# Patient Record
Sex: Female | Born: 1990
Health system: Southern US, Community
[De-identification: ages and names within clinical notes are randomized; demographics above are authoritative.]

## PROBLEM LIST (undated history)

## (undated) ENCOUNTER — Inpatient Hospital Stay (HOSPITAL_COMMUNITY): Payer: Self-pay

## (undated) DIAGNOSIS — Z8619 Personal history of other infectious and parasitic diseases: Secondary | ICD-10-CM

## (undated) DIAGNOSIS — A549 Gonococcal infection, unspecified: Secondary | ICD-10-CM

## (undated) DIAGNOSIS — T7840XA Allergy, unspecified, initial encounter: Secondary | ICD-10-CM

## (undated) DIAGNOSIS — J45909 Unspecified asthma, uncomplicated: Secondary | ICD-10-CM

## (undated) DIAGNOSIS — F32A Depression, unspecified: Secondary | ICD-10-CM

## (undated) DIAGNOSIS — D649 Anemia, unspecified: Secondary | ICD-10-CM

## (undated) HISTORY — DX: Allergy, unspecified, initial encounter: T78.40XA

## (undated) HISTORY — PX: NO PAST SURGERIES: SHX2092

---
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
## 2015-11-23 ENCOUNTER — Emergency Department (HOSPITAL_COMMUNITY): Payer: Federal, State, Local not specified - PPO

## 2015-11-23 ENCOUNTER — Emergency Department (HOSPITAL_COMMUNITY)
Admission: EM | Admit: 2015-11-23 | Discharge: 2015-11-23 | Disposition: A | Discharge status: Home / Self Care | Payer: Federal, State, Local not specified - PPO | Attending: Emergency Medicine | Admitting: Emergency Medicine

## 2015-11-23 ENCOUNTER — Encounter (HOSPITAL_COMMUNITY): Payer: Self-pay | Admitting: *Deleted

## 2015-11-23 DIAGNOSIS — Y929 Unspecified place or not applicable: Secondary | ICD-10-CM | POA: Insufficient documentation

## 2015-11-23 DIAGNOSIS — Y990 Civilian activity done for income or pay: Secondary | ICD-10-CM | POA: Diagnosis not present

## 2015-11-23 DIAGNOSIS — Y9389 Activity, other specified: Secondary | ICD-10-CM | POA: Insufficient documentation

## 2015-11-23 DIAGNOSIS — M79672 Pain in left foot: Secondary | ICD-10-CM | POA: Diagnosis present

## 2015-11-23 DIAGNOSIS — S92355A Nondisplaced fracture of fifth metatarsal bone, left foot, initial encounter for closed fracture: Secondary | ICD-10-CM

## 2015-11-23 DIAGNOSIS — W2201XA Walked into wall, initial encounter: Secondary | ICD-10-CM | POA: Insufficient documentation

## 2015-11-23 MED ORDER — HYDROCODONE-ACETAMINOPHEN 5-325 MG PO TABS: 2.0000 | ORAL_TABLET | ORAL | 0 refills | Status: DC | PRN

## 2015-11-23 MED ORDER — IBUPROFEN 800 MG PO TABS: 800.0000 mg | ORAL_TABLET | Freq: Three times a day (TID) | ORAL | 0 refills | Status: DC

## 2015-11-23 NOTE — ED Provider Notes (Signed)
[facility]-EMERGENCY DEPT Provider Note   CSN: [id number] Arrival date & time: 11/23/15  8828  First Provider Contact:  None    By signing my name below, I, Tanda Rockers, attest that this documentation has been prepared under the direction and in the presence of Langston Masker, [place].  Electronically Signed: Tanda Rockers, ED Scribe. 11/23/15. 8:06 PM.   History   Chief Complaint No chief complaint on file.   HPI Andrea Foster is a 25 y.o. female who presents to the Emergency Department complaining of sudden onset, constant, left foot pain x 4 days. Pt reports that she ran down 4 flights of stairs when she couldn't stop and inverted her left ankle. Pt did have mild pain to the left foot and ankle then but reports that 2 days ago while at work she hit her left foot on a wall, causing worsening pain to the area. She was able to ambulate after the incident but mentioned having pain with ambulation and bearing weight. She also notes bruising to her toes. Denies weakness, numbness, tingling, or any other associated symptoms.    The history is provided by the patient. No language interpreter was used.    No past medical history on file.  There are no active problems to display for this patient.   No past surgical history on file.  OB History    No data available       Home Medications    Prior to Admission medications   Not on File    Family History No family history on file.  Social History Social History  Substance Use Topics  . Smoking status: Not on file  . Smokeless tobacco: Not on file  . Alcohol use Not on file     Allergies   Review of patient's allergies indicates not on file.   Review of Systems Review of Systems  Musculoskeletal: Positive for arthralgias.  Skin: Positive for color change.  Neurological: Negative for weakness and numbness.  All other systems reviewed and are negative.    Physical Exam Updated Vital Signs There were no vitals taken  for this visit.  Physical Exam  Constitutional: She is oriented to person, place, and time. She appears well-developed and well-nourished. No distress.  HENT:  Head: Normocephalic and atraumatic.  Eyes: Conjunctivae and EOM are normal.  Neck: Neck supple. No tracheal deviation present.  Cardiovascular: Normal rate.   Pulmonary/Chest: Effort normal. No respiratory distress.  Abdominal: Soft.  Musculoskeletal: Normal range of motion. She exhibits tenderness.  Bruised left toes, tender left midfoot to toes. NVI and neurosensory intact.   Neurological: She is alert and oriented to person, place, and time.  Skin: Skin is warm and dry.  Psychiatric: She has a normal mood and affect. Her behavior is normal.  Nursing note and vitals reviewed.    ED Treatments / Results   DIAGNOSTIC STUDIES: Oxygen Saturation is 100% on RA, normal by my interpretation.    COORDINATION OF CARE: 7:54 PM-Discussed treatment plan which includes DG L Foot and DG L Ankle with pt at bedside and pt agreed to plan.    Labs (all labs ordered are listed, but only abnormal results are displayed) Labs Reviewed - No data to display  EKG  EKG Interpretation None       Radiology No results found.  Procedures Procedures (including critical care time)  Medications Ordered in ED Medications - No data to display   Initial Impression / Assessment and Plan / ED Course  I  have reviewed the triage vital signs and the nursing notes.  Pertinent labs & imaging results that were available during my care of the patient were reviewed by me and considered in my medical decision making (see chart for details).  Clinical Course    Pt counseled on fracture.  Pt advised to follow up with Dr. Despina Hick.    Final Clinical Impressions(s) / ED Diagnoses   Final diagnoses:  Closed nondisplaced fracture of fifth left metatarsal bone, initial encounter    New Prescriptions New Prescriptions   HYDROCODONE-ACETAMINOPHEN  (NORCO/VICODIN) 5-325 MG TABLET    Take 2 tablets by mouth every 4 (four) hours as needed.   IBUPROFEN (ADVIL,MOTRIN) 800 MG TABLET    Take 1 tablet (800 mg total) by mouth 3 (three) times daily.     Lonia Skinner Needham, PA-C 11/23/15 2106    Mancel Bale, MD 11/26/15 1239

## 2015-11-23 NOTE — ED Notes (Addendum)
Pt has echymosis to second ,thrid and fourth toes on her left foot. Per PA make sure x-ray shields the pt prior to x-ray of her foot. (7:50pm)pt tolerated x-ray-Phoned ortho tech. (9pm)pt placed in a cam boot and given crutch walking instructions. Pt returned demonstration on using crutches.

## 2016-01-28 ENCOUNTER — Emergency Department (HOSPITAL_COMMUNITY)
Admission: EM | Admit: 2016-01-28 | Discharge: 2016-01-28 | Disposition: A | Discharge status: Home / Self Care | Payer: Federal, State, Local not specified - PPO | Attending: Physician Assistant | Admitting: Physician Assistant

## 2016-01-28 ENCOUNTER — Emergency Department (HOSPITAL_COMMUNITY): Payer: Federal, State, Local not specified - PPO

## 2016-01-28 ENCOUNTER — Encounter (HOSPITAL_COMMUNITY): Payer: Self-pay

## 2016-01-28 DIAGNOSIS — Y939 Activity, unspecified: Secondary | ICD-10-CM | POA: Insufficient documentation

## 2016-01-28 DIAGNOSIS — Z79899 Other long term (current) drug therapy: Secondary | ICD-10-CM | POA: Insufficient documentation

## 2016-01-28 DIAGNOSIS — W19XXXA Unspecified fall, initial encounter: Secondary | ICD-10-CM | POA: Diagnosis not present

## 2016-01-28 DIAGNOSIS — Y929 Unspecified place or not applicable: Secondary | ICD-10-CM | POA: Insufficient documentation

## 2016-01-28 DIAGNOSIS — Y999 Unspecified external cause status: Secondary | ICD-10-CM | POA: Insufficient documentation

## 2016-01-28 DIAGNOSIS — S4992XA Unspecified injury of left shoulder and upper arm, initial encounter: Secondary | ICD-10-CM | POA: Diagnosis present

## 2016-01-28 DIAGNOSIS — S46912A Strain of unspecified muscle, fascia and tendon at shoulder and upper arm level, left arm, initial encounter: Secondary | ICD-10-CM | POA: Diagnosis not present

## 2016-01-28 MED ORDER — METHOCARBAMOL 500 MG PO TABS: 500.0000 mg | ORAL_TABLET | Freq: Two times a day (BID) | ORAL | 0 refills | Status: DC | PRN

## 2016-01-28 MED ORDER — IBUPROFEN 800 MG PO TABS: 800.0000 mg | ORAL_TABLET | Freq: Three times a day (TID) | ORAL | 0 refills | Status: DC | PRN

## 2016-01-28 NOTE — ED Triage Notes (Signed)
Pt c/o L shoulder pain radiating into base of neck x 6 days.  Pain score 5/10.  Pt reports taking ibuprofen w/o relief.  Pt sts "I think, I slept on it wrong and then, I fell on it on Saturday(6 days ago)."  Full ROM noted.  Pain increases w/ movement.  Pt sts "it feels better when the hot water is hitting it in the shower."

## 2016-01-28 NOTE — ED Provider Notes (Signed)
[facility]-EMERG[facility]Y DEPT Provider Note   CSN: [id number] Arrival date & time: 01/28/16  1603  By signing my name below, I, Andrea Foster, attest that this documentation has been prepared under the direction and in the presence of [facility] Sch[facility]yler[facility]ime Zyler Hyson, PA-C. Electronically Signed: Angelene GiovanniEmmanuella Foster, ED Scribe. 01/28/16. 5:05 PM.    History   Chief Complaint Chief Complaint  Patient presents with  . Shoulder Pain   HPI Comments: Andrea Foster is a 25 y.o. female who presents to the Emergency Department complaining of gradually worsening moderate left shoulder pain that radiates toward her neck and upper arm onset 10 days ago. She notes that the pain is worse with movement but better with a hot shower. Pt states that she has tried to use ice, OTC icy hot patches, and ibuprofen with no relief. She denies any recent injuries, falls, or trauma but states that she believes she "slept on it wrong". Pt is right hand dominate. 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 No fever, numbness/tingling, or any open wounds.   The history is provided by the patient. No language interpreter was used.    History reviewed. No pertinent past medical history.  There are no active problems to display for this patient.   History reviewed. No pertinent surgical history.  OB History    Gravida Para Term Preterm AB Living   0 0 0 0 0 0   SAB TAB Ectopic Multiple Live Births   0 0 0 0 0       Home Medications    Prior to Admission medications   Medication Sig Start Date End Date Taking? Authorizing Provider  HYDROcodone-acetaminophen (NORCO/VICODIN) 5-325 MG tablet Take 2 tablets by mouth every 4 (four) hours as needed. 11/23/15   Andrea AreasLeslie K Sofia, PA-C  ibuprofen (ADVIL,MOTRIN) 800 MG tablet Take 1 tablet (800 mg total) by mouth every 8 (eight) hours as needed. 01/28/16   Andrea PicketJaime Pilcher Andrea Wiesen, PA-C  methocarbamol (ROBAXIN) 500 MG tablet Take 1 tablet (500 mg total) by mouth 2 (two) times  daily as needed for muscle spasms. 01/28/16   Andrea PicketJaime Pilcher Andrea Tiznado, PA-C    Family History History reviewed. No pertinent family history.  Social History Social History  Substance Use Topics  . Smoking status: Never Smoker  . Smokeless tobacco: Never Used  . Alcohol use 0.6 oz/week    1 Shots of liquor per week     Comment: socially     Allergies   Latex   Review of Systems Review of Systems  Constitutional: Negative for fever.  Musculoskeletal: Positive for arthralgias.  Skin: Negative for wound.  Neurological: Negative for numbness.     Physical Exam Updated Vital Signs BP 103/70 (BP Location: Left Arm)   Pulse 67   Temp 98.9 F (37.2 C) (Oral)   Resp 18   LMP 01/16/2016   SpO2 100%   Physical Exam  Constitutional: She is oriented to person, place, and time. She appears well-developed and well-nourished. No distress.  HENT:  Head: Normocephalic and atraumatic.  Neck: Normal range of motion.  No midline tenderness. Full ROM without pain.   Cardiovascular: Normal rate, regular rhythm and normal heart sounds.   Pulmonary/Chest: Effort normal and breath sounds normal. No respiratory distress.  Musculoskeletal:       Arms: Left shoulder : TTP as depicted in image. Full ROM. Negative Empty can test, Negative Neer's, Negative Lift off. No swelling, erythema, or ecchymosis present. No step-off, crepitus, or  deformity appreciated. 5/5 muscle strength of BUE. 2+ radial pulse, sensation intact, all compartments soft.  No midline C/T/L spine tenderness.   Neurological: She is alert and oriented to person, place, and time.  Skin: Skin is warm and dry.  Nursing note and vitals reviewed.    ED Treatments / Results  DIAGNOSTIC STUDIES: Oxygen Saturation is 94% on RA, adeuqate by my interpretation.    COORDINATION OF CARE: 5:04 PM- Pt advised of plan for treatment and pt agrees. Pt informed of her x-ray results. She will receive Ibuprofen and Robaxin for pain relief. Pt  will receive a work note to return to work in 3 days. Will provide resources for Ortho follow up if pain persists.    Labs (all labs ordered are listed, but only abnormal results are displayed) Labs Reviewed - No data to display  EKG  EKG Interpretation None       Radiology Dg Shoulder Left  Result Date: 01/28/2016 CLINICAL DATA:  Fall 6 days ago.  Left shoulder pain EXAM: LEFT SHOULDER - 2+ VIEW COMPARISON:  None. FINDINGS: There is no evidence of fracture or dislocation. There is no evidence of arthropathy or other focal bone abnormality. Soft tissues are unremarkable. IMPRESSION: Negative. Electronically Signed   By: Marlan Palau M.D.   On: 01/28/2016 16:41    Procedures Procedures (including critical care time)  Medications Ordered in ED Medications - No data to display   Initial Impression / Assessment and Plan / ED Course  Andrea Sauer, PA-C has reviewed the triage vital signs and the nursing notes.  Pertinent labs & imaging results that were available during my care of the patient were reviewed by me and considered in my medical decision making (see chart for details).  Clinical Course   Andrea Foster is a 25 y.o. female who presents to ED with left shoulder and left-sided neck pain after sleeping on it oddly 6 days ago. Likely musk etiology. X-rays obtained and unremarkable. Symptomatic home care instructions discussed. Offered sling but patient does not feel it is necessary. PCP follow up if symptoms persist. Reasons to return to ED discussed and all questions answered.   Final Clinical Impressions(s) / ED Diagnoses   Final diagnoses:  Left shoulder strain, initial encounter    New Prescriptions Discharge Medication List as of 01/28/2016  5:06 PM    START taking these medications   Details  methocarbamol (ROBAXIN) 500 MG tablet Take 1 tablet (500 mg total) by mouth 2 (two) times daily as needed for muscle spasms., Starting Fri 01/28/2016, Print       I  personally performed the services described in this documentation, which was scribed in my presence. The recorded information has been reviewed and is accurate.    Andrea Picket Sheana Bir, PA-C 01/28/16 2002    Courteney Randall An, MD 01/29/16 2101

## 2016-01-28 NOTE — Discharge Instructions (Signed)
Take ibuprofen as needed for pain. Robaxin as her muscle relaxer you can take this as needed for pain/muscle spasms -This can make you very drowsy - please do not drink alcohol, operate heavy machinery or drive on this medication. Ice will also 8) pain relief. If symptoms do not improve in the next week, please follow up with the orthopedic physician listed. Return to ER for new or worsening symptoms, any additional concerns.  COLD THERAPY DIRECTIONS:  Ice or gel packs can be used to reduce both pain and swelling. Ice is the most helpful within the first 24 to 48 hours after an injury or flareup from overusing a muscle or joint.  Ice is effective, has very few side effects, and is safe for most people to use.   If you expose your skin to cold temperatures for too long or without the proper protection, you can damage your skin or nerves. Watch for signs of skin damage due to cold.   HOME CARE INSTRUCTIONS  Follow these tips to use ice and cold packs safely.  Place a dry or damp towel between the ice and skin. A damp towel will cool the skin more quickly, so you may need to shorten the time that the ice is used.  For a more rapid response, add gentle compression to the ice.  Ice for no more than 10 to 20 minutes at a time. The bonier the area you are icing, the less time it will take to get the benefits of ice.  Check your skin after 5 minutes to make sure there are no signs of a poor response to cold or skin damage.  Rest 20 minutes or more in between uses.  Once your skin is numb, you can end your treatment. You can test numbness by very lightly touching your skin. The touch should be so light that you do not see the skin dimple from the pressure of your fingertip. When using ice, most people will feel these normal sensations in this order: cold, burning, aching, and numbness.

## 2016-05-01 NOTE — L&D Delivery Note (Signed)
Patient is 26 y.o. G1P0000 [id number] admitted for active labor.   Delivery Note At 1500 a viable girl was delivered via SVD, Presentation: cephalic,ROA. APGAR: 8,9 ; weight pending.   Placenta status: spontaneous, intact. Cord: 3 vessel  Anesthesia:  epidural Episiotomy:  none Lacerations:  none Suture Repair: none Est. Blood Loss (mL): 200  Mom to postpartum.  Baby to Couplet care / Skin to Skin.  Delivery performed by Dr. Adrian BlackwaterStinson.  Rolm BookbinderAmber Frankey Botting, DO [facility]

## 2016-09-06 ENCOUNTER — Emergency Department (HOSPITAL_COMMUNITY)
Admission: EM | Admit: 2016-09-06 | Discharge: 2016-09-06 | Disposition: A | Discharge status: Home / Self Care | Payer: Federal, State, Local not specified - PPO

## 2016-09-06 ENCOUNTER — Inpatient Hospital Stay (HOSPITAL_COMMUNITY): Payer: Federal, State, Local not specified - PPO

## 2016-09-06 ENCOUNTER — Inpatient Hospital Stay (HOSPITAL_COMMUNITY)
Admission: AD | Admit: 2016-09-06 | Discharge: 2016-09-06 | Disposition: A | Discharge status: Home / Self Care | Payer: Federal, State, Local not specified - PPO | Source: Ambulatory Visit | Attending: Family Medicine | Admitting: Family Medicine

## 2016-09-06 ENCOUNTER — Encounter (HOSPITAL_COMMUNITY): Payer: Self-pay | Admitting: *Deleted

## 2016-09-06 DIAGNOSIS — B9689 Other specified bacterial agents as the cause of diseases classified elsewhere: Secondary | ICD-10-CM | POA: Diagnosis not present

## 2016-09-06 DIAGNOSIS — O23591 Infection of other part of genital tract in pregnancy, first trimester: Secondary | ICD-10-CM | POA: Diagnosis not present

## 2016-09-06 DIAGNOSIS — N939 Abnormal uterine and vaginal bleeding, unspecified: Secondary | ICD-10-CM | POA: Diagnosis present

## 2016-09-06 DIAGNOSIS — O209 Hemorrhage in early pregnancy, unspecified: Secondary | ICD-10-CM | POA: Diagnosis not present

## 2016-09-06 DIAGNOSIS — O98811 Other maternal infectious and parasitic diseases complicating pregnancy, first trimester: Secondary | ICD-10-CM | POA: Insufficient documentation

## 2016-09-06 DIAGNOSIS — B3731 Acute candidiasis of vulva and vagina: Secondary | ICD-10-CM

## 2016-09-06 DIAGNOSIS — Z3A01 Less than 8 weeks gestation of pregnancy: Secondary | ICD-10-CM | POA: Diagnosis not present

## 2016-09-06 DIAGNOSIS — Z91040 Latex allergy status: Secondary | ICD-10-CM | POA: Insufficient documentation

## 2016-09-06 DIAGNOSIS — O418X10 Other specified disorders of amniotic fluid and membranes, first trimester, not applicable or unspecified: Secondary | ICD-10-CM

## 2016-09-06 DIAGNOSIS — O468X1 Other antepartum hemorrhage, first trimester: Secondary | ICD-10-CM

## 2016-09-06 DIAGNOSIS — B373 Candidiasis of vulva and vagina: Secondary | ICD-10-CM

## 2016-09-06 DIAGNOSIS — O208 Other hemorrhage in early pregnancy: Secondary | ICD-10-CM | POA: Diagnosis not present

## 2016-09-06 DIAGNOSIS — Z3491 Encounter for supervision of normal pregnancy, unspecified, first trimester: Secondary | ICD-10-CM

## 2016-09-06 DIAGNOSIS — N760 Acute vaginitis: Secondary | ICD-10-CM | POA: Insufficient documentation

## 2016-09-06 DIAGNOSIS — O26851 Spotting complicating pregnancy, first trimester: Secondary | ICD-10-CM | POA: Diagnosis not present

## 2016-09-06 HISTORY — DX: Gonococcal infection, unspecified: A54.9

## 2016-09-06 HISTORY — DX: Personal history of other infectious and parasitic diseases: Z86.19

## 2016-09-06 HISTORY — DX: Unspecified asthma, uncomplicated: J45.909

## 2016-09-06 LAB — POCT PREGNANCY, URINE: Preg Test, Ur: POSITIVE — AB

## 2016-09-06 LAB — URINALYSIS, ROUTINE W REFLEX MICROSCOPIC
Bacteria, UA: NONE SEEN
Bilirubin Urine: NEGATIVE
Glucose, UA: NEGATIVE mg/dL
Hgb urine dipstick: NEGATIVE
Ketones, ur: NEGATIVE mg/dL
Nitrite: NEGATIVE
Protein, ur: NEGATIVE mg/dL
Specific Gravity, Urine: 1.026 (ref 1.005–1.030)
pH: 6 (ref 5.0–8.0)

## 2016-09-06 LAB — CBC
HCT: 35.1 % — ABNORMAL LOW (ref 36.0–46.0)
Hemoglobin: 12 g/dL (ref 12.0–15.0)
MCH: 29.4 pg (ref 26.0–34.0)
MCHC: 34.2 g/dL (ref 30.0–36.0)
MCV: 86 fL (ref 78.0–100.0)
Platelets: 256 10*3/uL (ref 150–400)
RBC: 4.08 MIL/uL (ref 3.87–5.11)
RDW: 12.1 % (ref 11.5–15.5)
WBC: 6.2 10*3/uL (ref 4.0–10.5)

## 2016-09-06 LAB — WET PREP, GENITAL: Trich, Wet Prep: NONE SEEN

## 2016-09-06 LAB — OB RESULTS CONSOLE ABO/RH: RH Type: POSITIVE

## 2016-09-06 LAB — ABO/RH: ABO/RH(D): O POS

## 2016-09-06 LAB — HCG, QUANTITATIVE, PREGNANCY: hCG, Beta Chain, Quant, S: 152187 m[IU]/mL — ABNORMAL HIGH (ref <5)

## 2016-09-06 MED ORDER — METRONIDAZOLE 500 MG PO TABS: 500.0000 mg | ORAL_TABLET | Freq: Two times a day (BID) | ORAL | 0 refills | Status: DC

## 2016-09-06 MED ORDER — TERCONAZOLE 0.8 % VA CREA: 1.0000 | TOPICAL_CREAM | Freq: Every day | VAGINAL | 0 refills | Status: DC

## 2016-09-06 NOTE — Discharge Instructions (Signed)
Vaginal Yeast infection, Adult Vaginal yeast infection is a condition that causes soreness, swelling, and redness (inflammation) of the vagina. It also causes vaginal discharge. This is a common condition. Some women get this infection frequently. What are the causes? This condition is caused by a change in the normal balance of the yeast (candida) and bacteria that live in the vagina. This change causes an overgrowth of yeast, which causes the inflammation. What increases the risk? This condition is more likely to develop in:  Women who take antibiotic medicines.  Women who have diabetes.  Women who take birth control pills.  Women who are pregnant.  Women who douche often.  Women who have a weak defense (immune) system.  Women who have been taking steroid medicines for a long time.  Women who frequently wear tight clothing. What are the signs or symptoms? Symptoms of this condition include:  White, thick vaginal discharge.  Swelling, itching, redness, and irritation of the vagina. The lips of the vagina (vulva) may be affected as well.  Pain or a burning feeling while urinating.  Pain during sex. How is this diagnosed? This condition is diagnosed with a medical history and physical exam. This will include a pelvic exam. Your health care provider will examine a sample of your vaginal discharge under a microscope. Your health care provider may send this sample for testing to confirm the diagnosis. How is this treated? This condition is treated with medicine. Medicines may be over-the-counter or prescription. You may be told to use one or more of the following:  Medicine that is taken orally.  Medicine that is applied as a cream.  Medicine that is inserted directly into the vagina (suppository). Follow these instructions at home:  Take or apply over-the-counter and prescription medicines only as told by your health care provider.  Do not have sex until your health care  provider has approved. Tell your sex partner that you have a yeast infection. That person should go to his or her health care provider if he or she develops symptoms.  Do not wear tight clothes, such as pantyhose or tight pants.  Avoid using tampons until your health care provider approves.  Eat more yogurt. This may help to keep your yeast infection from returning.  Try taking a sitz bath to help with discomfort. This is a warm water bath that is taken while you are sitting down. The water should only come up to your hips and should cover your buttocks. Do this 3-4 times per day or as told by your health care provider.  Do not douche.  Wear breathable, cotton underwear.  If you have diabetes, keep your blood sugar levels under control. Contact a health care provider if:  You have a fever.  Your symptoms go away and then return.  Your symptoms do not get better with treatment.  Your symptoms get worse.  You have new symptoms.  You develop blisters in or around your vagina.  You have blood coming from your vagina and it is not your menstrual period.  You develop pain in your abdomen. This information is not intended to replace advice given to you by your health care provider. Make sure you discuss any questions you have with your health care provider. Document Released: 01/25/2005 Document Revised: 09/29/2015 Document Reviewed: 10/19/2014 Elsevier Interactive Patient Education  2017 [employer] Bacterial Vaginosis Bacterial vaginosis is a vaginal infection that occurs when the normal balance of bacteria in the vagina is disrupted. It results from  an overgrowth of certain bacteria. This is the most common vaginal infection among women ages 41-44. Because bacterial vaginosis increases your risk for STIs (sexually transmitted infections), getting treated can help reduce your risk for chlamydia, gonorrhea, herpes, and HIV (human immunodeficiency virus). Treatment is also important  for preventing complications in pregnant women, because this condition can cause an early (premature) delivery. What are the causes? This condition is caused by an increase in harmful bacteria that are normally present in small amounts in the vagina. However, the reason that the condition develops is not fully understood. What increases the risk? The following factors may make you more likely to develop this condition:  Having a new sexual partner or multiple sexual partners.  Having unprotected sex.  Douching.  Having an intrauterine device (IUD).  Smoking.  Drug and alcohol abuse.  Taking certain antibiotic medicines.  Being pregnant. You cannot get bacterial vaginosis from toilet seats, bedding, swimming pools, or contact with objects around you. What are the signs or symptoms? Symptoms of this condition include:  Grey or white vaginal discharge. The discharge can also be watery or foamy.  A fish-like odor with discharge, especially after sexual intercourse or during menstruation.  Itching in and around the vagina.  Burning or pain with urination. Some women with bacterial vaginosis have no signs or symptoms. How is this diagnosed? This condition is diagnosed based on:  Your medical history.  A physical exam of the vagina.  Testing a sample of vaginal fluid under a microscope to look for a large amount of bad bacteria or abnormal cells. Your health care provider may use a cotton swab or a small wooden spatula to collect the sample. How is this treated? This condition is treated with antibiotics. These may be given as a pill, a vaginal cream, or a medicine that is put into the vagina (suppository). If the condition comes back after treatment, a second round of antibiotics may be needed. Follow these instructions at home: Medicines   Take over-the-counter and prescription medicines only as told by your health care provider.  Take or use your antibiotic as told by your  health care provider. Do not stop taking or using the antibiotic even if you start to feel better. General instructions   If you have a female sexual partner, tell her that you have a vaginal infection. She should see her health care provider and be treated if she has symptoms. If you have a female sexual partner, he does not need treatment.  During treatment:  Avoid sexual activity until you finish treatment.  Do not douche.  Avoid alcohol as directed by your health care provider.  Avoid breastfeeding as directed by your health care provider.  Drink enough water and fluids to keep your urine clear or pale yellow.  Keep the area around your vagina and rectum clean.  Wash the area daily with warm water.  Wipe yourself from front to back after using the toilet.  Keep all follow-up visits as told by your health care provider. This is important. How is this prevented?  Do not douche.  Wash the outside of your vagina with warm water only.  Use protection when having sex. This includes latex condoms and dental dams.  Limit how many sexual partners you have. To help prevent bacterial vaginosis, it is best to have sex with just one partner (monogamous).  Make sure you and your sexual partner are tested for STIs.  Wear cotton or cotton-lined underwear.  Avoid wearing  tight pants and pantyhose, especially during summer.  Limit the amount of alcohol that you drink.  Do not use any products that contain nicotine or tobacco, such as cigarettes and e-cigarettes. If you need help quitting, ask your health care provider.  Do not use illegal drugs. Where to find more information:  Centers for Disease Control and Prevention: SolutionApps.co.zawww.cdc.gov/std  American Sexual Health Association (ASHA): www.ashastd.org  [place] Department of Health and Health and safety inspectorHuman Services, Office on Women's Health: ConventionalMedicines.siwww.womenshealth.gov/ or http://www.anderson-williamson.info/https://www.womenshealth.gov/a-z-topics/bacterial-vaginosis Contact a health care provider  if:  Your symptoms do not improve, even after treatment.  You have more discharge or pain when urinating.  You have a fever.  You have pain in your abdomen.  You have pain during sex.  You have vaginal bleeding between periods. Summary  Bacterial vaginosis is a vaginal infection that occurs when the normal balance of bacteria in the vagina is disrupted.  Because bacterial vaginosis increases your risk for STIs (sexually transmitted infections), getting treated can help reduce your risk for chlamydia, gonorrhea, herpes, and HIV (human immunodeficiency virus). Treatment is also important for preventing complications in pregnant women, because the condition can cause an early (premature) delivery.  This condition is treated with antibiotic medicines. These may be given as a pill, a vaginal cream, or a medicine that is put into the vagina (suppository). This information is not intended to replace advice given to you by your health care provider. Make sure you discuss any questions you have with your health care provider. Document Released: 04/17/2005 Document Revised: 01/01/2016 Document Reviewed: 01/01/2016 Elsevier Interactive Patient Education  2017 [employer] Inc. Subchorionic Hematoma A subchorionic hematoma is a gathering of blood between the outer wall of the placenta and the inner wall of the womb (uterus). The placenta is the organ that connects the fetus to the wall of the uterus. The placenta performs the feeding, breathing (oxygen to the fetus), and waste removal (excretory work) of the fetus. Subchorionic hematoma is the most common abnormality found on a result from ultrasonography done during the first trimester or early second trimester of pregnancy. If there has been little or no vaginal bleeding, early small hematomas usually shrink on their own and do not affect your baby or pregnancy. The blood is gradually absorbed over 1-2 weeks. When bleeding starts later in pregnancy or  the hematoma is larger or occurs in an older pregnant woman, the outcome may not be as good. Larger hematomas may get bigger, which increases the chances for miscarriage. Subchorionic hematoma also increases the risk of premature detachment of the placenta from the uterus, preterm (premature) labor, and stillbirth. Follow these instructions at home:  Stay on bed rest if your health care provider recommends this. Although bed rest will not prevent more bleeding or prevent a miscarriage, your health care provider may recommend bed rest until you are advised otherwise.  Avoid heavy lifting (more than 10 lb [4.5 kg]), exercise, sexual intercourse, or douching as directed by your health care provider.  Keep track of the number of pads you use each day and how soaked (saturated) they are. Write down this information.  Do not use tampons.  Keep all follow-up appointments as directed by your health care provider. Your health care provider may ask you to have follow-up blood tests or ultrasound tests or both. Get help right away if:  You have severe cramps in your stomach, back, abdomen, or pelvis.  You have a fever.  You pass large clots or tissue. Save any tissue for  your health care provider to look at.  Your bleeding increases or you become lightheaded, feel weak, or have fainting episodes. This information is not intended to replace advice given to you by your health care provider. Make sure you discuss any questions you have with your health care provider. Document Released: 08/02/2006 Document Revised: 09/23/2015 Document Reviewed: 11/14/2012 Elsevier Interactive Patient Education  2017 [employer].

## 2016-09-06 NOTE — MAU Provider Note (Signed)
History     CSN: [id number]  Arrival date and time: 09/06/16 1137  First Provider Initiated Contact with Patient 09/06/16 1321   Chief Complaint  Patient presents with  . Possible Pregnancy  . Vaginal Bleeding   HPI Andrea Foster is a 26 y.o. G1P0000 at 7090w4d by LMP who presents with vaginal bleeding. Reports intermittent spotting since last week. Spotting is light pink & on toilet paper. Had 1 episode of bright red blood in underwear a few days ago. Spotting today occurred after having intercourse this morning. Denies abdominal pain, dysuria, or vaginal discharge.   OB History    Gravida Para Term Preterm AB Living   1 0 0 0 0 0   SAB TAB Ectopic Multiple Live Births   0 0 0 0 0      Past Medical History:  Diagnosis Date  . Hx of chlamydia infection 2013    Past Surgical History:  Procedure Laterality Date  . NO PAST SURGERIES      No family history on file.  Social History  Substance Use Topics  . Smoking status: Never Smoker  . Smokeless tobacco: Never Used  . Alcohol use 0.6 oz/week    1 Shots of liquor per week     Comment: socially    Allergies:  Allergies  Allergen Reactions  . Latex Hives    Prescriptions Prior to Admission  Medication Sig Dispense Refill Last Dose  . HYDROcodone-acetaminophen (NORCO/VICODIN) 5-325 MG tablet Take 2 tablets by mouth every 4 (four) hours as needed. 10 tablet 0   . ibuprofen (ADVIL,MOTRIN) 800 MG tablet Take 1 tablet (800 mg total) by mouth every 8 (eight) hours as needed. 21 tablet 0   . methocarbamol (ROBAXIN) 500 MG tablet Take 1 tablet (500 mg total) by mouth 2 (two) times daily as needed for muscle spasms. 12 tablet 0     Review of Systems  Constitutional: Negative.   Gastrointestinal: Negative for abdominal pain.  Genitourinary: Positive for vaginal bleeding. Negative for dysuria and vaginal discharge.   Physical Exam   Blood pressure 119/70, pulse 67, temperature 98.3 F (36.8 C), temperature source Oral,  resp. rate 16, height 5\' 3"  (1.6 m), weight 145 lb 12 oz (66.1 kg), last menstrual period 07/22/2016, SpO2 100 %.  Physical Exam  Nursing note and vitals reviewed. Constitutional: She is oriented to person, place, and time. She appears well-developed and well-nourished. No distress.  HENT:  Head: Normocephalic and atraumatic.  Eyes: Conjunctivae are normal. Right eye exhibits no discharge. Left eye exhibits no discharge. No scleral icterus.  Neck: Normal range of motion.  Respiratory: Effort normal. No respiratory distress.  GI: Soft. She exhibits no distension. There is no tenderness.  Genitourinary: Uterus normal. Cervix exhibits no motion tenderness and no friability. Right adnexum displays no mass and no tenderness. Left adnexum displays no mass and no tenderness. No bleeding in the vagina. Vaginal discharge (small amount of thin white discharge) found.  Genitourinary Comments: Cervix closed  Neurological: She is alert and oriented to person, place, and time.  Skin: Skin is warm and dry. She is not diaphoretic.  Psychiatric: She has a normal mood and affect. Her behavior is normal. Judgment and thought content normal.    MAU Course  Procedures Results for orders placed or performed during the hospital encounter of 09/06/16 (from the past 48 hour(s))  Urinalysis, Routine w reflex microscopic     Status: Abnormal   Collection Time: 09/06/16 12:30 PM  Result Value Ref  Range   Color, Urine YELLOW YELLOW   APPearance CLEAR CLEAR   Specific Gravity, Urine 1.026 1.005 - 1.030   pH 6.0 5.0 - 8.0   Glucose, UA NEGATIVE NEGATIVE mg/dL   Hgb urine dipstick NEGATIVE NEGATIVE   Bilirubin Urine NEGATIVE NEGATIVE   Ketones, ur NEGATIVE NEGATIVE mg/dL   Protein, ur NEGATIVE NEGATIVE mg/dL   Nitrite NEGATIVE NEGATIVE   Leukocytes, UA SMALL (A) NEGATIVE   RBC / HPF 0-5 0 - 5 RBC/hpf   WBC, UA 0-5 0 - 5 WBC/hpf   Bacteria, UA NONE SEEN NONE SEEN   Squamous Epithelial / LPF 0-5 (A) NONE SEEN    Mucous PRESENT   Pregnancy, urine POC     Status: Abnormal   Collection Time: 09/06/16 12:36 PM  Result Value Ref Range   Preg Test, Ur POSITIVE (A) NEGATIVE    Comment:        THE SENSITIVITY OF THIS METHODOLOGY IS >24 mIU/mL   CBC     Status: Abnormal   Collection Time: 09/06/16 12:51 PM  Result Value Ref Range   WBC 6.2 4.0 - 10.5 K/uL   RBC 4.08 3.87 - 5.11 MIL/uL   Hemoglobin 12.0 12.0 - 15.0 g/dL   HCT 16.1 (L) 09.6 - 04.5 %   MCV 86.0 78.0 - 100.0 fL   MCH 29.4 26.0 - 34.0 pg   MCHC 34.2 30.0 - 36.0 g/dL   RDW 40.9 81.1 - 91.4 %   Platelets 256 150 - 400 K/uL  ABO/Rh     Status: None   Collection Time: 09/06/16 12:51 PM  Result Value Ref Range   ABO/RH(D) O POS   hCG, quantitative, pregnancy     Status: Abnormal   Collection Time: 09/06/16 12:51 PM  Result Value Ref Range   hCG, Beta Chain, Quant, S 152,187 (H) <5 mIU/mL    Comment:          GEST. AGE      CONC.  (mIU/mL)   <=1 WEEK        5 - 50     2 WEEKS       50 - 500     3 WEEKS       100 - 10,000     4 WEEKS     1,000 - 30,000     5 WEEKS     3,500 - 115,000   6-8 WEEKS     12,000 - 270,000    12 WEEKS     15,000 - 220,000        FEMALE AND NON-PREGNANT FEMALE:     LESS THAN 5 mIU/mL    US Ob Comp Less 14 Wks  Result Date: 09/06/2016 CLINICAL DATA:  Vaginal bleeding in first trimester pregnancy. EXAM: OBSTETRIC <14 WK [place] AND TRANSVAGINAL OB US TECHNIQUE: Both transabdominal and transvaginal ultrasound examinations were performed for complete evaluation of the gestation as well as the maternal uterus, adnexal regions, and pelvic cul-de-sac. Transvaginal technique was performed to assess early pregnancy. COMPARISON:  None. FINDINGS: Intrauterine gestational sac: Single Yolk sac:  Visualized. Embryo:  Visualized. Cardiac Activity: Visualized. Heart Rate: 133  bpm CRL:  11  mm   7 w   1 d                  [place] EDC: 04/24/2017 Subchorionic hemorrhage:  Small subchorionic hemorrhage noted. Maternal uterus/adnexae:  Small right ovarian corpus luteum noted. Normal appearance of left ovary. No adnexal mass or  abnormal free fluid identified . IMPRESSION: Single living IUP measuring 7 weeks 1 day, with [place] EDC of 04/24/2017. Small subchorionic hemorrhage. Electronically Signed   By: Myles Rosenthal M.D.   On: 09/06/2016 14:28   US Ob Transvaginal  Result Date: 09/06/2016 CLINICAL DATA:  Vaginal bleeding in first trimester pregnancy. EXAM: OBSTETRIC <14 WK [place] AND TRANSVAGINAL OB US TECHNIQUE: Both transabdominal and transvaginal ultrasound examinations were performed for complete evaluation of the gestation as well as the maternal uterus, adnexal regions, and pelvic cul-de-sac. Transvaginal technique was performed to assess early pregnancy. COMPARISON:  None. FINDINGS: Intrauterine gestational sac: Single Yolk sac:  Visualized. Embryo:  Visualized. Cardiac Activity: Visualized. Heart Rate: 133  bpm CRL:  11  mm   7 w   1 d                  [place] EDC: 04/24/2017 Subchorionic hemorrhage:  Small subchorionic hemorrhage noted. Maternal uterus/adnexae: Small right ovarian corpus luteum noted. Normal appearance of left ovary. No adnexal mass or abnormal free fluid identified . IMPRESSION: Single living IUP measuring 7 weeks 1 day, with [place] EDC of 04/24/2017. Small subchorionic hemorrhage. Electronically Signed   By: Myles Rosenthal M.D.   On: 09/06/2016 14:28   MDM +UPT UA, wet prep, GC/chlamydia, CBC, ABO/Rh, quant hCG, HIV, and [place] today to rule out ectopic pregnancy O positive Ultrasound shows SIUP with cardiac activity & small [facility] Assessment and Plan  A; 1. Normal IUP (intrauterine pregnancy) on prenatal ultrasound, first trimester   2. Vaginal bleeding in pregnancy, first trimester   3. Subchorionic hematoma in first trimester, single or unspecified fetus   4. BV (bacterial vaginosis)   5. Vaginal yeast infection    P: Discharge home Rx flagyl & terazol Discussed reasons to return to MAU Keep follow up appointment with OB/PCP   GC/CT pending   Judeth Horn 09/06/2016, 1:21 PM

## 2016-09-06 NOTE — MAU Note (Signed)
Found out preg a couple wks ago, now she is spotting. No pain.  Had intercourse prior to spotting

## 2016-09-07 ENCOUNTER — Telehealth (HOSPITAL_COMMUNITY): Payer: Self-pay | Admitting: *Deleted

## 2016-09-07 ENCOUNTER — Other Ambulatory Visit (HOSPITAL_COMMUNITY): Payer: Self-pay | Admitting: Advanced Practice Midwife

## 2016-09-07 LAB — GC/CHLAMYDIA PROBE AMP (~~LOC~~) NOT AT ARMC
Chlamydia: POSITIVE — AB
Neisseria Gonorrhea: NEGATIVE

## 2016-09-07 LAB — HIV ANTIBODY (ROUTINE TESTING W REFLEX): HIV Screen 4th Generation wRfx: NONREACTIVE

## 2016-09-07 MED ORDER — AZITHROMYCIN 500 MG PO TABS: 1000.0000 mg | ORAL_TABLET | Freq: Once | ORAL | 0 refills | Status: AC

## 2016-09-07 NOTE — Telephone Encounter (Signed)
1st attempt to contact patient regarding patient's STD lab results from her last visit. Left message on patient's voice mail to call back at her convenience. 

## 2016-09-07 NOTE — Progress Notes (Unsigned)
Rx sent for Azithromycin for chlamydia Tech will notify patient Aviva SignsWilliams, Crockett Rallo L, CNM

## 2016-09-07 NOTE — Telephone Encounter (Signed)
Patient returned call regarding Pos Chlamydia results @ 2040. Patient was instructed to contact partner of exposure and to abstain from sexual activitiy for 14 days.

## 2016-09-21 DIAGNOSIS — Z3200 Encounter for pregnancy test, result unknown: Secondary | ICD-10-CM | POA: Diagnosis not present

## 2016-10-30 DIAGNOSIS — N760 Acute vaginitis: Secondary | ICD-10-CM | POA: Diagnosis not present

## 2016-10-30 DIAGNOSIS — Z3401 Encounter for supervision of normal first pregnancy, first trimester: Secondary | ICD-10-CM | POA: Diagnosis not present

## 2016-10-30 LAB — CULTURE, OB URINE: Urine Culture, OB: NEGATIVE

## 2016-10-30 LAB — OB RESULTS CONSOLE RPR: RPR: NONREACTIVE

## 2016-10-30 LAB — OB RESULTS CONSOLE HEPATITIS B SURFACE ANTIGEN: Hepatitis B Surface Ag: NEGATIVE

## 2016-10-30 LAB — OB RESULTS CONSOLE VARICELLA ZOSTER ANTIBODY, IGG: Varicella: IMMUNE

## 2016-10-30 LAB — OB RESULTS CONSOLE GC/CHLAMYDIA
Chlamydia: NEGATIVE
Gonorrhea: NEGATIVE

## 2016-10-30 LAB — SICKLE CELL SCREEN: Sickle Cell Screen: NORMAL

## 2016-10-30 LAB — CYTOLOGY - PAP: Pap: NEGATIVE

## 2016-10-30 LAB — CYSTIC FIBROSIS DIAGNOSTIC STUDY: Interpretation-CFDNA:: NEGATIVE

## 2016-10-30 LAB — OB RESULTS CONSOLE RUBELLA ANTIBODY, IGM: Rubella: NON-IMMUNE/NOT IMMUNE

## 2016-11-16 ENCOUNTER — Inpatient Hospital Stay (HOSPITAL_COMMUNITY)
Admission: AD | Admit: 2016-11-16 | Discharge: 2016-11-16 | Disposition: A | Discharge status: Home / Self Care | Payer: Medicaid Other | Source: Ambulatory Visit | Attending: Obstetrics and Gynecology | Admitting: Obstetrics and Gynecology

## 2016-11-16 ENCOUNTER — Encounter (HOSPITAL_COMMUNITY): Payer: Self-pay | Admitting: *Deleted

## 2016-11-16 DIAGNOSIS — Z331 Pregnant state, incidental: Secondary | ICD-10-CM | POA: Diagnosis not present

## 2016-11-16 DIAGNOSIS — Z026 Encounter for examination for insurance purposes: Secondary | ICD-10-CM

## 2016-11-16 DIAGNOSIS — O26892 Other specified pregnancy related conditions, second trimester: Secondary | ICD-10-CM | POA: Insufficient documentation

## 2016-11-16 DIAGNOSIS — Z3A16 16 weeks gestation of pregnancy: Secondary | ICD-10-CM | POA: Insufficient documentation

## 2016-11-16 DIAGNOSIS — Z3492 Encounter for supervision of normal pregnancy, unspecified, second trimester: Secondary | ICD-10-CM

## 2016-11-16 DIAGNOSIS — W208XXA Other cause of strike by thrown, projected or falling object, initial encounter: Secondary | ICD-10-CM

## 2016-11-16 DIAGNOSIS — Y990 Civilian activity done for income or pay: Secondary | ICD-10-CM | POA: Diagnosis not present

## 2016-11-16 DIAGNOSIS — W1839XA Other fall on same level, initial encounter: Secondary | ICD-10-CM | POA: Diagnosis not present

## 2016-11-16 DIAGNOSIS — S3981XA Other specified injuries of abdomen, initial encounter: Secondary | ICD-10-CM | POA: Diagnosis not present

## 2016-11-16 DIAGNOSIS — R109 Unspecified abdominal pain: Secondary | ICD-10-CM | POA: Insufficient documentation

## 2016-11-16 LAB — URINALYSIS, ROUTINE W REFLEX MICROSCOPIC
Bilirubin Urine: NEGATIVE
Glucose, UA: NEGATIVE mg/dL
Hgb urine dipstick: NEGATIVE
Ketones, ur: NEGATIVE mg/dL
Leukocytes, UA: NEGATIVE
Nitrite: NEGATIVE
Protein, ur: >=300 mg/dL — AB
Specific Gravity, Urine: 1.022 (ref 1.005–1.030)
pH: 6 (ref 5.0–8.0)

## 2016-11-16 NOTE — MAU Provider Note (Signed)
History     CSN: [id number]  Arrival date and time: 11/16/16 0126  First Provider Initiated Contact with Patient 11/16/16 0159      Chief Complaint  Patient presents with  . Abdominal Pain   HPI Andrea Foster is a 26 y.o. G1P0000 at 7562w5d who presents for evaluated after a box fell on her at work. Patient works at [employer]. States earlier tonight she was pulling a large box off a high shelf and it fell on her, hitting her in her mouth, on her arms, and her upper abdomen ultimately causing her to fall on her right hip. Since then reports pain in her upper abdomen. Rates pain 6/10 & describes as sore. Has not treated. Nothing makes better or worse. Denies hip pain. Denies syncope, hitting her head, lower abdominal pain, or vaginal bleeding. [facility].   OB History    Gravida Para Term Preterm AB Living   1 0 0 0 0 0   SAB TAB Ectopic Multiple Live Births   0 0 0 0 0      Past Medical History:  Diagnosis Date  . Asthma    seasonal as a child  . Gonorrhea   . Hx of chlamydia infection 2013    Past Surgical History:  Procedure Laterality Date  . NO PAST SURGERIES      Family History  Problem Relation Age of Onset  . Cancer Maternal Grandfather        ? agent orange  . Asthma Neg Hx   . Diabetes Neg Hx   . Heart disease Neg Hx   . Hypertension Neg Hx   . Stroke Neg Hx     Social History  Substance Use Topics  . Smoking status: Never Smoker  . Smokeless tobacco: Never Used  . Alcohol use 0.6 oz/week    1 Shots of liquor per week     Comment: rare, spicial occasions    Allergies:  Allergies  Allergen Reactions  . Latex Hives    Prescriptions Prior to Admission  Medication Sig Dispense Refill Last Dose  . loratadine (CLARITIN) 10 MG tablet Take 10 mg by mouth daily as needed for allergies.   Past Month at Unknown time  . metroNIDAZOLE (FLAGYL) 500 MG tablet Take 1 tablet (500 mg total) by mouth 2 (two) times daily. 14 tablet 0   . Multiple Vitamin  (MULTIVITAMIN WITH MINERALS) TABS tablet Take 1 tablet by mouth daily.   09/06/2016 at Unknown time  . terconazole (TERAZOL 3) 0.8 % vaginal cream Place 1 applicator vaginally at bedtime. 20 g 0     Review of Systems  Constitutional: Negative.   Gastrointestinal: Positive for abdominal pain. Negative for diarrhea, nausea and vomiting.  Genitourinary: Negative.  Negative for vaginal bleeding.  Skin: Positive for wound.  Neurological: Negative for syncope and headaches.   Physical Exam   Blood pressure 115/70, pulse 88, temperature 98.4 F (36.9 C), temperature source Oral, resp. rate 16, height 5\' 3"  (1.6 m), weight 148 lb (67.1 kg), last menstrual period 07/22/2016.  Physical Exam  Nursing note and vitals reviewed. Constitutional: She is oriented to person, place, and time. She appears well-developed and well-nourished. No distress.  HENT:  Head: Normocephalic and atraumatic.  Mouth/Throat: Lacerations present.  No loosed or chipped teeth.  Superficial abrasion ~0.5 cm in the center of lower lip.  Eyes: Conjunctivae are normal. Right eye exhibits no discharge. Left eye exhibits no discharge. No scleral icterus.  Neck: Normal range  of motion.  Respiratory: Effort normal. No respiratory distress.  GI: Soft. There is no tenderness.  Abdomen soft & non tender. No bruising. No abrasions.   Musculoskeletal: Normal range of motion. She exhibits no tenderness or deformity.  Neurological: She is alert and oriented to person, place, and time.  Skin: Skin is warm and dry. Abrasion and bruising noted. She is not diaphoretic.  Abrasions & bruising notes on inner aspect of right forearm, & left upper arm.  Psychiatric: She has a normal mood and affect. Her behavior is normal. Judgment and thought content normal.    MAU Course  Procedures Results for orders placed or performed during the hospital encounter of 11/16/16 (from the past 24 hour(s))  Urinalysis, Routine w reflex microscopic      Status: Abnormal   Collection Time: 11/16/16  1:37 AM  Result Value Ref Range   Color, Urine YELLOW YELLOW   APPearance CLOUDY (A) CLEAR   Specific Gravity, Urine 1.022 1.005 - 1.030   pH 6.0 5.0 - 8.0   Glucose, UA NEGATIVE NEGATIVE mg/dL   Hgb urine dipstick NEGATIVE NEGATIVE   Bilirubin Urine NEGATIVE NEGATIVE   Ketones, ur NEGATIVE NEGATIVE mg/dL   Protein, ur >=161 (A) NEGATIVE mg/dL   Nitrite NEGATIVE NEGATIVE   Leukocytes, UA NEGATIVE NEGATIVE   RBC / HPF 0-5 0 - 5 RBC/hpf   WBC, UA 6-30 0 - 5 WBC/hpf   Bacteria, UA RARE (A) NONE SEEN   Squamous Epithelial / LPF 6-30 (A) NONE SEEN   Mucous PRESENT     MDM FHT 145 Abdomen soft & non tender; no bruising noted to abdomen  Assessment and Plan  A; 1. Encounter for assessment of work-related causation of injury   2. Fetal heart tones present, second trimester    P: Discharge home Work restriction note provided to patient Tylenol prn pain Discussed reasons to return to MAU Discussed treatment for abrasions Keep f/u with OB  Judeth Horn 11/16/2016, 1:59 AM

## 2016-11-16 NOTE — MAU Note (Signed)
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 Having upper abd pain. Denies bleeding.

## 2016-11-16 NOTE — Discharge Instructions (Signed)
Preventing Injuries During Pregnancy °Trauma is the most common cause of injury and death in pregnant women. This can also result in serious harm to the baby or even death. °How can injuries affect my pregnancy? °Your baby is protected in the womb (uterus) by a sac filled with fluid (amniotic sac). Your baby can be harmed if there is a direct blow to your abdomen and pelvis. Trauma may be caused by: °· Falls. These are more common in the second and third trimester of pregnancy. °· Automobile accidents. °· Domestic violence or assault. °· Severe burns, such as from fire or electricity. ° °These injuries can result in: °· Tearing of your uterus. °· The placenta pulling away from the wall of the uterus (placental abruption). °· The amniotic sac breaking open (rupture of membranes). °· Blockage or decrease in the blood supply to your baby. °· Going into labor earlier than expected. °· Severe injuries to other parts of your body, such as your brain, spine, heart, lungs, or other organs. ° °Minor falls and low-impact automobile accidents do not usually harm your baby, even if they cause a little harm to you. °What can I do to lower my risk? °Safety °· Remove slippery rugs and loose objects on the floor. They increase your risk of tripping or slipping. °· Wear comfortable shoes that have a good grip on the sole. Do not wear high-heeled shoes. °· Always wear your seat belt properly when riding in a car. Use both the lap and shoulder belt, with the lap belt below your abdomen. Always practice safe driving. Do not ride on a motorcycle while pregnant. °Activity °· Avoid walking on wet or slippery floors. °· Do not participate in rough and violent activities or sports. °· Avoid high-risk situations and activities such as: °? Lifting heavy pots of boiling or hot liquids. °? Fixing electrical problems. °? Being near fires or starting fires. °General instructions °· Take over-the-counter and prescription medicines only as told by  your health care provider. °· Know your blood type and the father's blood type in case you develop vaginal bleeding or experience an injury for which a blood transfusion is needed. °· Spousal abuse can be a serious cause of trauma during pregnancy. If you are a victim of domestic violence or assault: °? Call your local emergency services (911 in the [place] °? Contact the National Domestic Violence Hotline for help and support. °When should I seek immediate medical care? °Get help right away if: °· You fall on your abdomen or experience any serious blow to your abdomen. °· You develop stiffness in your neck or pain after a fall or from other trauma. °· You develop a headache or vision problems after a fall or from other trauma. °· You do not feel the baby moving after a fall or trauma, or you feel that the baby is not moving as much as before the fall or trauma. °· You have been the victim of domestic violence or any other kind of physical attack. °· You have been in a car accident. °· You develop vaginal bleeding. °· You have fluid leaking from the vagina. °· You develop uterine contractions. Symptoms include pelvic cramping, pain, or serious low back pain. °· You become weak, faint, or have uncontrolled vomiting after trauma. °· You have a serious burn. This includes burns to the face, neck, hands, or genitals, or burns greater than the size of your palm anywhere else. ° °Summary °· Trauma is the most common cause of   injury and death in pregnant women and can also lead to injury or death of the baby. °· Falls, automobile accidents, domestic violence or assault, and severe burns can injure you or your baby. Make sure to get medical help right away if you experience any of these during your pregnancy. °· Take steps to prevent slips or falls in your home, such as avoiding slippery floors and removing loose rugs. °· Always wear your seat belt properly when riding in a car. Practice safe driving. °This information is  not intended to replace advice given to you by your health care provider. Make sure you discuss any questions you have with your health care provider. °Document Released: 05/25/2004 Document Revised: 04/26/2016 Document Reviewed: 04/26/2016 °Elsevier Interactive Patient Education © 2017 Elsevier Inc. ° °

## 2016-11-18 LAB — OB RESULTS CONSOLE ANTIBODY SCREEN: Antibody Screen: NEGATIVE

## 2016-11-27 DIAGNOSIS — O99330 Smoking (tobacco) complicating pregnancy, unspecified trimester: Secondary | ICD-10-CM | POA: Diagnosis not present

## 2016-11-27 DIAGNOSIS — Z3689 Encounter for other specified antenatal screening: Secondary | ICD-10-CM | POA: Diagnosis not present

## 2016-12-18 ENCOUNTER — Inpatient Hospital Stay (HOSPITAL_COMMUNITY)
Admission: AD | Admit: 2016-12-18 | Discharge: 2016-12-18 | Disposition: A | Discharge status: Home / Self Care | Payer: Federal, State, Local not specified - PPO | Source: Ambulatory Visit | Attending: Obstetrics and Gynecology | Admitting: Obstetrics and Gynecology

## 2016-12-18 ENCOUNTER — Inpatient Hospital Stay (HOSPITAL_COMMUNITY): Payer: Federal, State, Local not specified - PPO

## 2016-12-18 ENCOUNTER — Encounter (HOSPITAL_COMMUNITY): Payer: Self-pay | Admitting: *Deleted

## 2016-12-18 DIAGNOSIS — Z91040 Latex allergy status: Secondary | ICD-10-CM | POA: Diagnosis not present

## 2016-12-18 DIAGNOSIS — O9A212 Injury, poisoning and certain other consequences of external causes complicating pregnancy, second trimester: Secondary | ICD-10-CM | POA: Diagnosis not present

## 2016-12-18 DIAGNOSIS — Z3A21 21 weeks gestation of pregnancy: Secondary | ICD-10-CM | POA: Insufficient documentation

## 2016-12-18 DIAGNOSIS — O26892 Other specified pregnancy related conditions, second trimester: Secondary | ICD-10-CM | POA: Insufficient documentation

## 2016-12-18 DIAGNOSIS — W208XXA Other cause of strike by thrown, projected or falling object, initial encounter: Secondary | ICD-10-CM | POA: Diagnosis not present

## 2016-12-18 DIAGNOSIS — R109 Unspecified abdominal pain: Secondary | ICD-10-CM | POA: Diagnosis not present

## 2016-12-18 LAB — URINALYSIS, ROUTINE W REFLEX MICROSCOPIC
Bilirubin Urine: NEGATIVE
Glucose, UA: NEGATIVE mg/dL
Hgb urine dipstick: NEGATIVE
Ketones, ur: NEGATIVE mg/dL
Leukocytes, UA: NEGATIVE
Nitrite: NEGATIVE
Protein, ur: NEGATIVE mg/dL
Specific Gravity, Urine: 1.012 (ref 1.005–1.030)
pH: 6 (ref 5.0–8.0)

## 2016-12-18 NOTE — MAU Provider Note (Signed)
  History     CSN: [id number]  Arrival date and time: 12/18/16 1824   None     Chief Complaint  Patient presents with  . Abdominal Pain  . Abdominal Injury   HPI 26 yo G1P0 at [redacted]w[redacted]d presenting today for the evaluation of abdominal trauma in pregnancy. 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 While she was picking up a box today, a box fell on her chest and she reflexively tried to catch it with her abdomen. She reports good fetal movement. She denies any abdominal cramping or vaginal bleeding. Patient has a scheduled follow up appointment with the health department on 8/27  OB History    Gravida Para Term Preterm AB Living   1 0 0 0 0 0   SAB TAB Ectopic Multiple Live Births   0 0 0 0 0      Past Medical History:  Diagnosis Date  . Asthma    seasonal as a child  . Gonorrhea   . Hx of chlamydia infection 2013    Past Surgical History:  Procedure Laterality Date  . NO PAST SURGERIES      Family History  Problem Relation Age of Onset  . Cancer Maternal Grandfather        ? agent orange  . Asthma Neg Hx   . Diabetes Neg Hx   . Heart disease Neg Hx   . Hypertension Neg Hx   . Stroke Neg Hx     Social History  Substance Use Topics  . Smoking status: Never Smoker  . Smokeless tobacco: Never Used  . Alcohol use 0.6 oz/week    1 Shots of liquor per week     Comment: rare, spicial occasions, not while preg    Allergies:  Allergies  Allergen Reactions  . Latex Hives    Prescriptions Prior to Admission  Medication Sig Dispense Refill Last Dose  . prenatal vitamin w/FE, FA (NATACHEW) 29-1 MG CHEW chewable tablet Chew 1 tablet by mouth daily at 12 noon.   12/18/2016 at Unknown time  . loratadine (CLARITIN) 10 MG tablet Take 10 mg by mouth daily as needed for allergies.   More than a month at Unknown time  . Multiple Vitamin (MULTIVITAMIN WITH MINERALS) TABS tablet Take 1 tablet by mouth daily.   More than a month at Unknown time     Review of Systems  See pertinent in HPI Physical Exam   Blood pressure 106/63, pulse 81, temperature 98.8 F (37.1 C), temperature source Oral, resp. rate 16, weight 154 lb 12 oz (70.2 kg), last menstrual period 07/22/2016, SpO2 100 %.  Physical Exam GENERAL: Well-developed, well-nourished female in no acute distress.  ABDOMEN: Soft, nontender,gravid. No bruising PELVIC: Not performed EXTREMITIES: No cyanosis, clubbing, or edema, 2+ distal pulses.  MAU Course  Procedures  MDM No results found. Prelim ultrasound- live fetus with normal placenta without evidence of abruption  Assessment and Plan  26 yo G1P0 at 21w2 s/p abdominal trauma in pregnancy - Ultrasound results reviewed- no evidence of abruption - Reassurance provided - Patient to follow up as scheduled for prenatal care - Precautions reviewed  Jas Betten 12/18/2016, 8:42 PM

## 2016-12-18 NOTE — MAU Note (Signed)
Palpable tightening of abd noted when listening to [facility]

## 2016-12-18 NOTE — MAU Note (Signed)
Box fell against abd.  abd balled up after.  No pain, just pressure when she balls up

## 2016-12-18 NOTE — Discharge Instructions (Signed)
Preventing Injuries During Pregnancy °Trauma is the most common cause of injury and death in pregnant women. This can also result in serious harm to the baby or even death. °How can injuries affect my pregnancy? °Your baby is protected in the womb (uterus) by a sac filled with fluid (amniotic sac). Your baby can be harmed if there is a direct blow to your abdomen and pelvis. Trauma may be caused by: °· Falls. These are more common in the second and third trimester of pregnancy. °· Automobile accidents. °· Domestic violence or assault. °· Severe burns, such as from fire or electricity. ° °These injuries can result in: °· Tearing of your uterus. °· The placenta pulling away from the wall of the uterus (placental abruption). °· The amniotic sac breaking open (rupture of membranes). °· Blockage or decrease in the blood supply to your baby. °· Going into labor earlier than expected. °· Severe injuries to other parts of your body, such as your brain, spine, heart, lungs, or other organs. ° °Minor falls and low-impact automobile accidents do not usually harm your baby, even if they cause a little harm to you. °What can I do to lower my risk? °Safety °· Remove slippery rugs and loose objects on the floor. They increase your risk of tripping or slipping. °· Wear comfortable shoes that have a good grip on the sole. Do not wear high-heeled shoes. °· Always wear your seat belt properly when riding in a car. Use both the lap and shoulder belt, with the lap belt below your abdomen. Always practice safe driving. Do not ride on a motorcycle while pregnant. °Activity °· Avoid walking on wet or slippery floors. °· Do not participate in rough and violent activities or sports. °· Avoid high-risk situations and activities such as: °? Lifting heavy pots of boiling or hot liquids. °? Fixing electrical problems. °? Being near fires or starting fires. °General instructions °· Take over-the-counter and prescription medicines only as told by  your health care provider. °· Know your blood type and the father's blood type in case you develop vaginal bleeding or experience an injury for which a blood transfusion is needed. °· Spousal abuse can be a serious cause of trauma during pregnancy. If you are a victim of domestic violence or assault: °? Call your local emergency services (911 in the [place] °? Contact the National Domestic Violence Hotline for help and support. °When should I seek immediate medical care? °Get help right away if: °· You fall on your abdomen or experience any serious blow to your abdomen. °· You develop stiffness in your neck or pain after a fall or from other trauma. °· You develop a headache or vision problems after a fall or from other trauma. °· You do not feel the baby moving after a fall or trauma, or you feel that the baby is not moving as much as before the fall or trauma. °· You have been the victim of domestic violence or any other kind of physical attack. °· You have been in a car accident. °· You develop vaginal bleeding. °· You have fluid leaking from the vagina. °· You develop uterine contractions. Symptoms include pelvic cramping, pain, or serious low back pain. °· You become weak, faint, or have uncontrolled vomiting after trauma. °· You have a serious burn. This includes burns to the face, neck, hands, or genitals, or burns greater than the size of your palm anywhere else. ° °Summary °· Trauma is the most common cause of   injury and death in pregnant women and can also lead to injury or death of the baby. °· Falls, automobile accidents, domestic violence or assault, and severe burns can injure you or your baby. Make sure to get medical help right away if you experience any of these during your pregnancy. °· Take steps to prevent slips or falls in your home, such as avoiding slippery floors and removing loose rugs. °· Always wear your seat belt properly when riding in a car. Practice safe driving. °This information is  not intended to replace advice given to you by your health care provider. Make sure you discuss any questions you have with your health care provider. °Document Released: 05/25/2004 Document Revised: 04/26/2016 Document Reviewed: 04/26/2016 °Elsevier Interactive Patient Education © 2017 Elsevier Inc. ° °

## 2016-12-25 DIAGNOSIS — O365930 Maternal care for other known or suspected poor fetal growth, third trimester, not applicable or unspecified: Secondary | ICD-10-CM | POA: Diagnosis not present

## 2017-01-02 DIAGNOSIS — N760 Acute vaginitis: Secondary | ICD-10-CM | POA: Diagnosis not present

## 2017-01-02 DIAGNOSIS — Z789 Other specified health status: Secondary | ICD-10-CM | POA: Diagnosis not present

## 2017-01-02 DIAGNOSIS — Z3402 Encounter for supervision of normal first pregnancy, second trimester: Secondary | ICD-10-CM | POA: Diagnosis not present

## 2017-01-02 DIAGNOSIS — O409XX0 Polyhydramnios, unspecified trimester, not applicable or unspecified: Secondary | ICD-10-CM | POA: Diagnosis not present

## 2017-01-02 LAB — GLUCOSE TOLERANCE, 1 HOUR: Glucose 1 Hour: 84

## 2017-01-10 ENCOUNTER — Encounter: Payer: Self-pay | Admitting: *Deleted

## 2017-01-11 NOTE — Progress Notes (Unsigned)
Opened in error

## 2017-01-15 ENCOUNTER — Encounter: Payer: Federal, State, Local not specified - PPO | Admitting: Obstetrics & Gynecology

## 2017-01-15 ENCOUNTER — Ambulatory Visit (INDEPENDENT_AMBULATORY_CARE_PROVIDER_SITE_OTHER): Payer: Federal, State, Local not specified - PPO | Admitting: Obstetrics & Gynecology

## 2017-01-15 DIAGNOSIS — O0992 Supervision of high risk pregnancy, unspecified, second trimester: Secondary | ICD-10-CM | POA: Diagnosis not present

## 2017-01-15 DIAGNOSIS — O403XX0 Polyhydramnios, third trimester, not applicable or unspecified: Secondary | ICD-10-CM | POA: Insufficient documentation

## 2017-01-15 DIAGNOSIS — O402XX0 Polyhydramnios, second trimester, not applicable or unspecified: Secondary | ICD-10-CM

## 2017-01-15 DIAGNOSIS — O0990 Supervision of high risk pregnancy, unspecified, unspecified trimester: Secondary | ICD-10-CM | POA: Insufficient documentation

## 2017-01-15 LAB — POCT URINALYSIS DIP (DEVICE)
Bilirubin Urine: NEGATIVE
Glucose, UA: NEGATIVE mg/dL
Hgb urine dipstick: NEGATIVE
Ketones, ur: NEGATIVE mg/dL
Leukocytes, UA: NEGATIVE
Nitrite: NEGATIVE
Protein, ur: NEGATIVE mg/dL
Specific Gravity, Urine: 1.02 (ref 1.005–1.030)
Urobilinogen, UA: 0.2 mg/dL (ref 0.0–1.0)
pH: 7 (ref 5.0–8.0)

## 2017-01-15 NOTE — Progress Notes (Signed)
  Subjective:Transfer from [facility] Mckenna is a G1P0000 [redacted]w[redacted]d being seen today for her first obstetrical visit.  Her obstetrical history is significant for polyhydramnios. Patient does intend to breast feed. Pregnancy history fully reviewed.  Patient reports no complaints.  Vitals:   01/15/17 1450  BP: 114/64  Pulse: 71  Weight: 163 lb 4.8 oz (74.1 kg)    HISTORY: OB History  Gravida Para Term Preterm AB Living  1 0 0 0 0 0  SAB TAB Ectopic Multiple Live Births  0 0 0 0 0    # Outcome Date GA Lbr Len/2nd Weight Sex Delivery Anes PTL Lv  1 Current              Past Medical History:  Diagnosis Date  . Asthma    seasonal as a child  . Gonorrhea   . Hx of chlamydia infection 2013   Past Surgical History:  Procedure Laterality Date  . NO PAST SURGERIES     Family History  Problem Relation Age of Onset  . Cancer Maternal Grandfather        ? agent orange  . Asthma Neg Hx   . Diabetes Neg Hx   . Heart disease Neg Hx   . Hypertension Neg Hx   . Stroke Neg Hx      Exam    Uterus:   28 cm FH   Pelvic Exam:                                    Skin: normal coloration and turgor, no rashes    Neurologic: oriented, normal mood   Extremities: normal strength, tone, and muscle mass   HEENT PERRLA   Mouth/Teeth     Neck supple   Cardiovascular: regular rate and rhythm   Respiratory:  appears well, vitals normal, no respiratory distress, acyanotic, normal RR   Abdomen: gravid and soft   Urinary:        Assessment:    Pregnancy: G1P0000 Patient Active Problem List   Diagnosis Date Noted  . Supervision of high risk pregnancy, antepartum 01/15/2017  . Polyhydramnios in second trimester, antepartum complication 01/15/2017        Plan:     Initial labs drawn. Prenatal vitamins. Problem list reviewed and updated. Genetic Screening discussed Quad Screen: results reviewed.  Ultrasound discussed; fetal survey: results reviewed.  Follow up in  2 weeks. 50% of 30 min visit spent on counseling and coordination of care.  Detailed [place] ordered   Scheryl Darter 01/15/2017

## 2017-01-15 NOTE — Patient Instructions (Signed)
Polyhydramnios When a woman becomes pregnant, a sac is formed around the fertilized egg (embryo) and later the growing baby (fetus). This sac is called the amniotic sac. The amniotic sac is filled with fluid. It gets bigger as the pregnancy grows. When there is too much fluid in the sac, it is called polyhydramnios. All babies born with polyhydramnios should be checked for congenital abnormalities. The amniotic fluid cushions and protects the baby. It also provides the baby with fluids and is crucial to normal development. Your baby breathes this fluid into its lungs and swallows it. This helps promote the healthy growth of the lungs and gastrointestinal tract. Amniotic fluid also helps the baby move around, helping with the normal development of muscle and bone. What are the causes? This condition is caused by:  Diabetes mellitus.  Down's syndrome, fetal abnormalities of the intestinal tract, and anencephaly (fetus without brain), all of which can prevent the fetus from swallowing the amniotic fluid.  One twins passes (transfuses) its blood into the other twin (twin-twin transfusion syndrome).  Medical illness of the mother, e.g., kidney or heart disease.  Tumor (chorioangioma) of the placenta.  What are the signs or symptoms? Symptoms of this condition include:  Enlarged uterus. The size of the uterus enlarges beyond what it should be for that particular time of the pregnancy.  Pressure and discomfort. The mother may feel more pressure and discomfort than should be expected.  Quick and unexpected enlargement of the mother's stomach.  How is this diagnosed? This condition is diagnosed when your health care provider measures you and notices that your uterus is beyond the size that is consistent with the time of the pregnancy.  An ultrasound is then used (abdominally or vaginally) to: ? See if you are carrying twins or more. ? Measure the growth of the baby. ? Look for birth  defects. ? Measure the amount of fluid in the amniotic sac.  Amniotic Fluid Index (AFI) measures the amount of fluid in the amniotic sac in four different areas. If there is more than 24 centimeters, you have polyhydramnios. How is this treated? This condition may be treated by:  Removing some fluid from the amniotic sac.  Taking medications that lower the fluids in your body.  Stopping the use of salt or salty foods because it causes you to keep fluid in your body (retention).  Follow these instructions at home:  Keep all your prenatal visits as told by your health care provider. It is important to follow your health care provider's recommendations.  Do not eat a lot of salt and salty foods.  If you have diabetes, keep it under control.  If you have heart or kidney disease, treat the disease as told by your health care provider. Contact a health care provider if:  You think your uterus has grown too fast in a short period of time.  You feel a great amount of pressure in your lower belly (pelvis) and are more uncomfortable than expected. Get help right away if:  You have a gush of fluid or are leaking fluid from your vagina.  You stop feeling the baby move.  You do not feel the baby kicking as much as usual.  You have a hard time keeping your diabetes under control.  You are having problems with your heart or kidney disease. This information is not intended to replace advice given to you by your health care provider. Make sure you discuss any questions you have with your health   care provider. Document Released: 07/08/2002 Document Revised: 09/23/2015 Document Reviewed: 11/14/2012 Elsevier Interactive Patient Education  2018 Elsevier Inc.  

## 2017-01-15 NOTE — Progress Notes (Signed)
Detailed [place] scheduled for September 25th @ 1330.  Pt notified.

## 2017-01-17 ENCOUNTER — Inpatient Hospital Stay (HOSPITAL_COMMUNITY): Payer: Federal, State, Local not specified - PPO

## 2017-01-17 ENCOUNTER — Observation Stay (HOSPITAL_COMMUNITY)
Admission: AD | Admit: 2017-01-17 | Discharge: 2017-01-18 | Disposition: A | Discharge status: Home / Self Care | Payer: Federal, State, Local not specified - PPO | Source: Ambulatory Visit | Attending: Obstetrics & Gynecology | Admitting: Obstetrics & Gynecology

## 2017-01-17 ENCOUNTER — Encounter (HOSPITAL_COMMUNITY): Payer: Self-pay | Admitting: *Deleted

## 2017-01-17 DIAGNOSIS — T1490XA Injury, unspecified, initial encounter: Secondary | ICD-10-CM | POA: Diagnosis not present

## 2017-01-17 DIAGNOSIS — O402XX0 Polyhydramnios, second trimester, not applicable or unspecified: Secondary | ICD-10-CM | POA: Diagnosis not present

## 2017-01-17 DIAGNOSIS — Z0379 Encounter for other suspected maternal and fetal conditions ruled out: Secondary | ICD-10-CM | POA: Diagnosis not present

## 2017-01-17 DIAGNOSIS — O9A212 Injury, poisoning and certain other consequences of external causes complicating pregnancy, second trimester: Secondary | ICD-10-CM | POA: Diagnosis not present

## 2017-01-17 DIAGNOSIS — O479 False labor, unspecified: Secondary | ICD-10-CM

## 2017-01-17 DIAGNOSIS — O4702 False labor before 37 completed weeks of gestation, second trimester: Secondary | ICD-10-CM | POA: Insufficient documentation

## 2017-01-17 DIAGNOSIS — Z3A25 25 weeks gestation of pregnancy: Secondary | ICD-10-CM | POA: Diagnosis not present

## 2017-01-17 DIAGNOSIS — O4700 False labor before 37 completed weeks of gestation, unspecified trimester: Secondary | ICD-10-CM

## 2017-01-17 DIAGNOSIS — Z79899 Other long term (current) drug therapy: Secondary | ICD-10-CM | POA: Diagnosis not present

## 2017-01-17 DIAGNOSIS — W19XXXA Unspecified fall, initial encounter: Secondary | ICD-10-CM

## 2017-01-17 DIAGNOSIS — Y92009 Unspecified place in unspecified non-institutional (private) residence as the place of occurrence of the external cause: Secondary | ICD-10-CM

## 2017-01-17 DIAGNOSIS — R109 Unspecified abdominal pain: Secondary | ICD-10-CM

## 2017-01-17 DIAGNOSIS — O9A219 Injury, poisoning and certain other consequences of external causes complicating pregnancy, unspecified trimester: Secondary | ICD-10-CM

## 2017-01-17 DIAGNOSIS — O26899 Other specified pregnancy related conditions, unspecified trimester: Secondary | ICD-10-CM

## 2017-01-17 LAB — BASIC METABOLIC PANEL
Anion gap: 7 (ref 5–15)
BUN: 6 mg/dL (ref 6–20)
CO2: 24 mmol/L (ref 22–32)
Calcium: 9.3 mg/dL (ref 8.9–10.3)
Chloride: 104 mmol/L (ref 101–111)
Creatinine, Ser: 0.61 mg/dL (ref 0.44–1.00)
GFR calc Af Amer: >60 mL/min (ref >60)
GFR calc non Af Amer: >60 mL/min (ref >60)
Glucose, Bld: 77 mg/dL (ref 65–99)
Potassium: 4.2 mmol/L (ref 3.5–5.1)
Sodium: 135 mmol/L (ref 135–145)

## 2017-01-17 LAB — URINALYSIS, ROUTINE W REFLEX MICROSCOPIC
Bilirubin Urine: NEGATIVE
Glucose, UA: NEGATIVE mg/dL
Hgb urine dipstick: NEGATIVE
Ketones, ur: 5 mg/dL — AB
Leukocytes, UA: NEGATIVE
Nitrite: NEGATIVE
Protein, ur: NEGATIVE mg/dL
Specific Gravity, Urine: 1.013 (ref 1.005–1.030)
pH: 7 (ref 5.0–8.0)

## 2017-01-17 LAB — CBC
HCT: 31.2 % — ABNORMAL LOW (ref 36.0–46.0)
Hemoglobin: 10.6 g/dL — ABNORMAL LOW (ref 12.0–15.0)
MCH: 30.9 pg (ref 26.0–34.0)
MCHC: 34 g/dL (ref 30.0–36.0)
MCV: 91 fL (ref 78.0–100.0)
Platelets: 256 10*3/uL (ref 150–400)
RBC: 3.43 MIL/uL — ABNORMAL LOW (ref 3.87–5.11)
RDW: 13.5 % (ref 11.5–15.5)
WBC: 10.9 10*3/uL — ABNORMAL HIGH (ref 4.0–10.5)

## 2017-01-17 MED ORDER — LACTATED RINGERS IV BOLUS (SEPSIS)
2000.0000 mL | Freq: Once | INTRAVENOUS | Status: AC
  Administered 2017-01-17: 2000 mL via INTRAVENOUS

## 2017-01-17 MED ORDER — PRENATAL MULTIVITAMIN CH
1.0000 | ORAL_TABLET | Freq: Every day | ORAL | Status: DC
  Filled 2017-01-17: qty 1

## 2017-01-17 MED ORDER — CALCIUM CARBONATE ANTACID 500 MG PO CHEW: 2.0000 | CHEWABLE_TABLET | ORAL | Status: DC | PRN

## 2017-01-17 MED ORDER — ACETAMINOPHEN 325 MG PO TABS: 650.0000 mg | ORAL_TABLET | ORAL | Status: DC | PRN

## 2017-01-17 MED ORDER — ZOLPIDEM TARTRATE 5 MG PO TABS: 5.0000 mg | ORAL_TABLET | Freq: Every evening | ORAL | Status: DC | PRN

## 2017-01-17 MED ORDER — DOCUSATE SODIUM 100 MG PO CAPS
100.0000 mg | ORAL_CAPSULE | Freq: Every day | ORAL | Status: DC
  Filled 2017-01-17: qty 1

## 2017-01-17 MED ORDER — LACTATED RINGERS IV SOLN
INTRAVENOUS | Status: DC
  Administered 2017-01-18: 01:00:00 via INTRAVENOUS

## 2017-01-17 MED ORDER — TERBUTALINE SULFATE 1 MG/ML IJ SOLN
0.2500 mg | Freq: Once | INTRAMUSCULAR | Status: AC
  Administered 2017-01-18: 0.25 mg via SUBCUTANEOUS
  Filled 2017-01-17: qty 1

## 2017-01-17 NOTE — MAU Provider Note (Signed)
[facility] Provider Note   CSN: [id number] Arrival date & time: 01/17/17  1726     History   Chief Complaint Chief Complaint  Patient presents with  . Fall    HPI Andrea Foster is a 26 y.o. G1P0000  gestation who presents to the MAU with c/o feeling sore all over since a fall at 1:30 this afternoon. Patient reports that she was mopping the floor and slipped and tried to catch her fall. She went all the way down landing on her knees and lower part of stomach and right elbow. She did not take anything for pain. After the fall she took a shower and drank water but felt like the baby didn't move as much as usual. She called her mom and her mom told her to come to the MAU to get things checked out. Patient reports that she is feeling the baby move as usual now. Patient has been able to walk and move all extremities without difficulty. She did scratch the eft side of her neck some way during the fall.   HPI  Past Medical History:  Diagnosis Date  . Asthma    seasonal as a child  . Gonorrhea   . Hx of chlamydia infection 2013    Patient Active Problem List   Diagnosis Date Noted  . Supervision of high risk pregnancy, antepartum 01/15/2017  . Polyhydramnios in second trimester, antepartum complication 01/15/2017    Past Surgical History:  Procedure Laterality Date  . NO PAST SURGERIES      OB History    Gravida Para Term Preterm AB Living   1 0 0 0 0 0   SAB TAB Ectopic Multiple Live Births   0 0 0 0 0       Home Medications    Prior to Admission medications   Medication Sig Start Date End Date Taking? Authorizing Provider  loratadine (CLARITIN) 10 MG tablet Take 10 mg by mouth daily as needed for allergies.    [provider]  prenatal vitamin w/FE, FA (NATACHEW) 29-1 MG CHEW chewable tablet Chew 1 tablet by mouth daily at 12 noon.    [provider]    Family History Family History  Problem Relation Age of Onset  . Cancer  Maternal Grandfather        ? agent orange  . Asthma Neg Hx   . Diabetes Neg Hx   . Heart disease Neg Hx   . Hypertension Neg Hx   . Stroke Neg Hx     Social History Social History  Substance Use Topics  . Smoking status: Never Smoker  . Smokeless tobacco: Never Used  . Alcohol use 0.6 oz/week    1 Shots of liquor per week     Comment: rare, spicial occasions, not while preg     Allergies   Latex   Review of Systems Review of Systems  Constitutional: Negative for chills and fever.  HENT: Negative.   Eyes: Negative for visual disturbance.  Respiratory: Negative for chest tightness and shortness of breath.   Cardiovascular: Negative for chest pain.  Gastrointestinal: Negative for abdominal pain and nausea.  Genitourinary: Negative for dysuria, frequency, urgency, vaginal bleeding and vaginal discharge.  Musculoskeletal: Positive for arthralgias. Negative for back pain.       Sore all over  Skin: Positive for wound.  Neurological: Negative for syncope and headaches.  Psychiatric/Behavioral: Negative for confusion. The patient is not nervous/anxious.      Physical Exam Updated  Vital Signs BP 103/62 (BP Location: Right Arm)   Pulse 62   Temp 98.1 F (36.7 C) (Oral)   Resp 18   Wt 162 lb (73.5 kg)   LMP 07/22/2016   SpO2 100%   BMI 28.70 kg/m   Physical Exam  Constitutional: She appears well-developed and well-nourished. No distress.  HENT:  Head: Normocephalic and atraumatic.  Eyes: Conjunctivae and EOM are normal.  Neck: Normal range of motion. Neck supple.  Abrasion left side of neck.  Cardiovascular: Normal rate and regular rhythm.   Pulmonary/Chest: Effort normal and breath sounds normal.  Abdominal: Soft. Bowel sounds are normal. There is no tenderness.  Gravid @ 25.4 weeks  Musculoskeletal: Normal range of motion.  Neurological: She is alert.  Skin: Skin is warm and dry.  Psychiatric: She has a normal mood and affect. Her behavior is normal.    Nursing note and vitals reviewed.    ED Treatments / Results  Labs (all labs ordered are listed, but only abnormal results are displayed) Labs Reviewed  URINALYSIS, ROUTINE W REFLEX MICROSCOPIC - Abnormal; Notable for the following:       Result Value   Ketones, ur 5 (*)    All other components within normal limits  CBC - Abnormal; Notable for the following:    WBC 10.9 (*)    RBC 3.43 (*)    Hemoglobin 10.6 (*)    HCT 31.2 (*)    All other components within normal limits  BASIC METABOLIC PANEL    Radiology No results found.  Procedures Procedures (including critical care time)  Medications Ordered in ED Medications  terbutaline (BRETHINE) injection 0.25 mg (not administered)  lactated ringers bolus 2,000 mL (0 mLs Intravenous Stopped 01/17/17 2235)     Initial Impression / Assessment and Plan / ED Course  I have reviewed the triage vital signs and the nursing notes. 7:25 pm EFM: baseline 145, contracting every 2 to 3 minutes, no decelerations.  Discussed with Dr. Despina Hidden, will give 2L LR IV and observe for 4 hours.  11:20 pm patient continues to have contractions, will admit for observation.   Care turned over to Philipp Deputy, CNM @ 11:24 pm  Final Clinical Impressions(s) / ED Diagnoses   Final diagnoses:  Preterm uterine contractions    New Prescriptions Current Discharge Medication List     Note by Mayer Camel  See H&P  Cam Hai 01/18/2017 12:41 AM

## 2017-01-17 NOTE — Progress Notes (Signed)
NP instructed RN to provide po hydration.

## 2017-01-17 NOTE — Addendum Note (Signed)
Addended by: Faythe Casa on: 01/17/2017 07:03 PM   Modules accepted: Orders

## 2017-01-17 NOTE — H&P (Signed)
[facility] AT [facility] Provider Note   CSN: [id number] Arrival date & time: 01/17/17  1726     History              Chief Complaint    Chief Complaint  Patient presents with  . Fall    HPI Andrea Foster is a 26 y.o. G1P0000  gestation who presents to the MAU with c/o feeling sore all over since a fall at 1:30 this afternoon. Patient reports that she was mopping the floor and slipped and tried to catch her fall. She went all the way down landing on her knees and lower part of stomach and right elbow. She did not take anything for pain. After the fall she took a shower and drank water but felt like the baby didn't move as much as usual. She called her mom and her mom told her to come to the MAU to get things checked out. Patient reports that she is feeling the baby move as usual now. Patient has been able to walk and move all extremities without difficulty. She did scratch the left side of her neck some way during the fall.   HPI      Past Medical History:  Diagnosis Date  . Asthma    seasonal as a child  . Gonorrhea   . Hx of chlamydia infection 2013    Patient Active Problem List   Diagnosis Date Noted  . Supervision of high risk pregnancy, antepartum 01/15/2017  . Polyhydramnios in second trimester, antepartum complication 01/15/2017         Past Surgical History:  Procedure Laterality Date  . NO PAST SURGERIES              OB History    Gravida Para Term Preterm AB Living   1 0 0 0 0 0   SAB TAB Ectopic Multiple Live Births   0 0 0 0 0       Home Medications            Prior to Admission medications   Medication Sig Start Date End Date Taking? Authorizing Provider  loratadine (CLARITIN) 10 MG tablet Take 10 mg by mouth daily as needed for allergies.    [provider]  prenatal vitamin w/FE, FA (NATACHEW) 29-1 MG CHEW chewable tablet Chew 1 tablet by mouth daily at 12 noon.    [provider]     Family History      Family History  Problem Relation Age of Onset  . Cancer Maternal Grandfather        ? agent orange  . Asthma Neg Hx   . Diabetes Neg Hx   . Heart disease Neg Hx   . Hypertension Neg Hx   . Stroke Neg Hx     Social History        Social History   Substance Use Topics   . Smoking status: Never Smoker   . Smokeless tobacco: Never Used   . Alcohol use 0.6 oz/week    1 Shots of liquor per week     Comment: rare, spicial occasions, not while preg      Allergies           Latex   Review of Systems Review of Systems  Constitutional: Negative for chills and fever.  HENT: Negative.   Eyes: Negative for visual disturbance.  Respiratory: Negative for chest tightness and shortness of breath.   Cardiovascular: Negative for chest pain.  Gastrointestinal: Negative for  abdominal pain and nausea.  Genitourinary: Negative for dysuria, frequency, urgency, vaginal bleeding and vaginal discharge.  Musculoskeletal: Positive for arthralgias. Negative for back pain.       Sore all over  Skin: Positive for wound.  Neurological: Negative for syncope and headaches.  Psychiatric/Behavioral: Negative for confusion. The patient is not nervous/anxious.      Physical Exam Updated Vital Signs BP 103/62 (BP Location: Right Arm)   Pulse 62   Temp 98.1 F (36.7 C) (Oral)   Resp 18   Wt 162 lb (73.5 kg)   LMP 07/22/2016   SpO2 100%   BMI 28.70 kg/m   Physical Exam  Constitutional: She appears well-developed and well-nourished. No distress.  HENT:  Head: Normocephalic and atraumatic.  Eyes: Conjunctivae and EOM are normal.  Neck: Normal range of motion. Neck supple.  Abrasion left side of neck.  Cardiovascular: Normal rate and regular rhythm.   Pulmonary/Chest: Effort normal and breath sounds normal.  Abdominal: Soft. Bowel sounds are normal. There is no tenderness.  Gravid @ 25.4 weeks  Musculoskeletal: Normal range of motion.   Neurological: She is alert.  Skin: Skin is warm and dry.  Psychiatric: She has a normal mood and affect. Her behavior is normal.  Nursing note and vitals reviewed.    ED Treatments / Results  Labs (all labs ordered are listed, but only abnormal results are displayed)      Labs Reviewed  URINALYSIS, ROUTINE W REFLEX MICROSCOPIC - Abnormal; Notable for the following:       Result Value    Ketones, ur 5 (*)    All other components within normal limits  CBC - Abnormal; Notable for the following:    WBC 10.9 (*)    RBC 3.43 (*)    Hemoglobin 10.6 (*)    HCT 31.2 (*)    All other components within normal limits  BASIC METABOLIC PANEL    Radiology Imaging Results (Last 48 hours)  No results found.    Procedures Procedures (including critical care time)  Medications Ordered in ED Medications  terbutaline (BRETHINE) injection 0.25 mg (not administered)  lactated ringers bolus 2,000 mL (0 mLs Intravenous Stopped 01/17/17 2235)     Initial Impression / Assessment and Plan / ED Course  I have reviewed the triage vital signs and the nursing notes. 7:25 pm EFM: baseline 145, contracting every 2 to 3 minutes, no decelerations.  Discussed with Dr. Despina Hidden, will give 2L LR IV and observe for 4 hours.  11:20 pm patient continues to have contractions, will admit for observation.   Care turned over to Philipp Deputy, CNM @ 11:24 pm  Final Clinical Impressions(s) / ED Diagnoses   Final diagnoses:  Preterm uterine contractions   Above note by Kerrie Buffalo NP  IUP@25 .4wks S/p fall with landing on abd Preterm ctx Polyhydramnios  Will admit under observation status IVF given; will try a dose of Terb Continuous monitoring  Cam Hai CNM 01/17/2017 11:36 PM

## 2017-01-17 NOTE — MAU Note (Signed)
Fell when she was mopping, landed on stomach.  Baby is moving.  No pain. No bleeding.

## 2017-01-18 ENCOUNTER — Encounter (HOSPITAL_COMMUNITY): Payer: Self-pay | Admitting: *Deleted

## 2017-01-18 DIAGNOSIS — O9A212 Injury, poisoning and certain other consequences of external causes complicating pregnancy, second trimester: Secondary | ICD-10-CM | POA: Diagnosis not present

## 2017-01-18 DIAGNOSIS — O4702 False labor before 37 completed weeks of gestation, second trimester: Secondary | ICD-10-CM | POA: Diagnosis not present

## 2017-01-18 DIAGNOSIS — R109 Unspecified abdominal pain: Secondary | ICD-10-CM | POA: Diagnosis not present

## 2017-01-18 DIAGNOSIS — Z0379 Encounter for other suspected maternal and fetal conditions ruled out: Secondary | ICD-10-CM | POA: Diagnosis not present

## 2017-01-18 DIAGNOSIS — O479 False labor, unspecified: Secondary | ICD-10-CM | POA: Diagnosis not present

## 2017-01-18 DIAGNOSIS — Z3A25 25 weeks gestation of pregnancy: Secondary | ICD-10-CM

## 2017-01-18 DIAGNOSIS — O26892 Other specified pregnancy related conditions, second trimester: Secondary | ICD-10-CM | POA: Diagnosis not present

## 2017-01-18 LAB — TYPE AND SCREEN
ABO/RH(D): O POS
Antibody Screen: NEGATIVE

## 2017-01-18 NOTE — Discharge Summary (Signed)
Discharge Summary   Admit Date: 01/17/2017 Discharge Date: 01/18/2017 Discharging Service: Antepartum  Primary OBGYN: [facility] Admitting Physician: Lazaro Arms, MD  Discharge Physician: Vergie Living  Referring Provider: MAU  Primary Care Provider: Department, [facility]  Admission Diagnoses: Pregnancy at 25/4 weeks Status post accidental fall Preterm contractions Idiopathic polyhydramnios  Discharge Diagnoses: Pregnancy at 25/5 weeks Status post accidental fall Resolved preterm contractions Idiopathic polyhydramnios  Consult Orders: None   Surgeries/Procedures Performed: None  History and Physical: Attestation signed by Lazaro Arms, MD at 01/18/2017 6:52 AM  Attestation of Attending Supervision of Advanced Practitioner (CNM/NP/PA): Evaluation and management procedures were performed by the Advanced Practitioner under my supervision and collaboration. I have reviewed the Advanced Practitioner's note and chart, and I agree with the management and plan.  Rockne Coons MD Attending Physician for the Center for [facility] 01/18/2017 6:52 AM        Hide copied text  [facility] AT [facility] Provider Note   CSN: [id number] Arrival date &time: 9/19/181726     History  Chief Complaint    Chief Complaint  Patient presents with  . Fall    HPI Andrea Foster a 26 y.o.G1P0000  gestation who presents to the MAU with c/o feeling sore all over since a fall at 1:30 this afternoon. Patient reports that she was mopping the floor and slipped and tried to catch her fall. She went all the way down landing on her knees and lower part of stomach and right elbow. She did not take anything for pain. After the fall she took a shower and drank water but felt like the baby didn't move as much as usual. She called her mom and her mom told her to come to the MAU to get things checked out. Patient reports that  she is feeling the baby move as usual now. Patient has been able to walk and move all extremities without difficulty. She did scratch the left side of her neck some way during the fall.   HPI      Past Medical History:  Diagnosis Date  . Asthma    seasonal as a child  . Gonorrhea   . Hx of chlamydia infection 2013        Patient Active Problem List   Diagnosis Date Noted  . Supervision of high risk pregnancy, antepartum 01/15/2017  . Polyhydramnios in second trimester, antepartum complication 01/15/2017         Past Surgical History:  Procedure Laterality Date  . NO PAST SURGERIES              OB History   Gravida Para Term Preterm AB Living   1 0 0 0 0 0   SAB TAB Ectopic Multiple Live Births   0 0 0 0 0       Home Medications          Prior to Admission medications   Medication Sig Start Date End Date Taking? Authorizing Provider  loratadine (CLARITIN) 10 MG tablet Take 10 mg by mouth daily as needed for allergies.    [provider]  prenatal vitamin w/FE, FA (NATACHEW) 29-1 MG CHEW chewable tablet Chew 1 tablet by mouth daily at 12 noon.    [provider]    Family History      Family History  Problem Relation Age of Onset  . Cancer Maternal Grandfather    ? agent orange  . Asthma Neg Hx   .  Diabetes Neg Hx   . Heart disease Neg Hx   . Hypertension Neg Hx   . Stroke Neg Hx     Social History        Social History   Substance Use Topics   . Smoking status: Never Smoker   . Smokeless tobacco: Never Used   . Alcohol use 0.6 oz/week    1 Shots of liquor per week     Comment: rare, spicial occasions, not while preg      Allergies  Latex   Review of Systems Review of Systems  Constitutional: Negative for chillsand fever.  HENT: Negative.  Eyes: Negative for visual disturbance.  Respiratory: Negative for chest  tightnessand shortness of breath.  Cardiovascular: Negative for chest pain.  Gastrointestinal: Negative for abdominal painand nausea.  Genitourinary: Negative for dysuria, frequency, urgency, vaginal bleedingand vaginal discharge.  Musculoskeletal: Positive for arthralgias. Negative for back pain.  Sore all over Skin: Positive for wound.  Neurological: Negative for syncopeand headaches.  Psychiatric/Behavioral: Negative for confusion. The patient is not nervous/anxious.     Physical Exam Updated Vital Signs BP 103/62 (BP Location: Right Arm)  Pulse 62  Temp 98.1 F (36.7 C) (Oral)  Resp 18  Wt 162 lb (73.5 kg)  LMP 07/22/2016  SpO2 100%  BMI 28.70 kg/m   Physical Exam Constitutional: She appears well-developedand well-nourished. No distress.  HENT:  Head: Normocephalicand atraumatic.  Eyes: Conjunctivaeand EOMare normal.  Neck: Normal range of motion. Neck supple.  Abrasion left side of neck. Cardiovascular: Normal rateand regular rhythm.  Pulmonary/Chest: Effort normaland breath sounds normal.  Abdominal: Soft. Bowel sounds are normal. There is no tenderness.  Gravid @ 25.4 weeks Musculoskeletal: Normal range of motion.  Neurological: She is alert.  Skin: Skin is warmand dry.  Psychiatric: She has a normal mood and affect. Her behavior is normal.  Nursing noteand vitalsreviewed.    ED Treatments / Results  Labs (all labs ordered are listed, but only abnormal results are displayed)      Labs Reviewed  URINALYSIS, ROUTINE W REFLEX MICROSCOPIC - Abnormal; Notable for the following:   Result Value    Ketones, ur 5 (*)    All other components within normal limits  CBC - Abnormal; Notable for the following:    WBC 10.9 (*)    RBC 3.43 (*)    Hemoglobin 10.6 (*)    HCT 31.2 (*)    All other components within normal limits  BASIC METABOLIC PANEL    Radiology Imaging Results (Last 48 hours)  No  results found.    Procedures Procedures(including critical care time)  Medications Ordered in ED Medications  terbutaline (BRETHINE) injection 0.25 mg (not administered)  lactated ringers bolus 2,000 mL (0 mLs Intravenous Stopped 01/17/17 2235)     Initial Impression / Assessment and Plan / ED Course  I have reviewed the triage vital signs and the nursing notes. 7:25 pm EFM: baseline 145, contracting every 2 to 3 minutes, no decelerations.  Discussed with Dr. Despina Hidden, will give 2L LR IV and observe for 4 hours.  11:20 pm patient continues to have contractions, will admit for observation.   Care turned over to Philipp Deputy, CNM @ 11:24 pm  Final Clinical Impressions(s) / ED Diagnoses   Final diagnoses:  Preterm uterine contractions   Above note by Kerrie Buffalo NP  IUP@25 .4wks S/p fall with landing on abd Preterm ctx Polyhydramnios  Will admit under observation status IVF given; will try a dose of Terb Continuous  monitoring  Cam Hai CNM 01/17/2017 11:36 PM      Electronically signed by Arabella Merles, CNM at 01/17/2017 11:36 PM Electronically signed by Lazaro Arms, MD at 01/18/2017 6:52 AM   Hospital Course: *IUP: category I and appropriate for GA *Obs s/p fall: likely due to idiopathic poly. mvp 9.9 last night on prelm and negative placenta. SVE unchanged. Will d/c to home with precautions. Pt asked if okay to work today and I told her it was.   Discharge Exam:   Current Vital Signs 24h Vital Sign Ranges  T 98.1 F (36.7 C) Temp  Avg: 98.3 F (36.8 C)  Min: 98.1 F (36.7 C)  Max: 98.6 F (37 C)  BP (!) 106/58 BP  Min: 103/62  Max: 109/61  HR 92 Pulse  Avg: 81.3  Min: 62  Max: 92  RR 16 Resp  Avg: 16.5  Min: 16  Max: 18  SaO2 100 % Not Delivered SpO2  Avg: 100 %  Min: 100 %  Max: 100 %       24 Hour I/O Current Shift I/O  Time Ins Outs No intake/output data recorded. No intake/output data recorded.   FHR: 145 baseline, no accels or  decels, mod var Toco: +irritability, rare ?UCs Gen: NAD SVE: cl (ext os 1cm)/long/high. No blood on glove  Discharge Disposition:  Home  Patient Instructions:  Standard   Results Pending at Discharge:  None  Discharge Medications: Allergies as of 01/18/2017      Reactions   Latex Hives      Medication List    TAKE these medications   loratadine 10 MG tablet Commonly known as:  CLARITIN Take 10 mg by mouth daily as needed for allergies.   prenatal vitamin w/FE, FA 29-1 MG Chew chewable tablet Chew 1 tablet by mouth daily at 12 noon.            Discharge Care Instructions        Start     Ordered   01/18/17 0000  Discharge patient    Question Answer Comment  Discharge disposition 01-Home or Self Care   Discharge patient date 01/18/2017      01/18/17 0901       Future Appointments Date Time Provider Department Center  01/23/2017 1:30 PM [facility] [place] [facility]  01/31/2017 2:00 PM Harmon, [place], DO [facility]    Cornelia Copa. MD Attending Center for [employer] [facility])

## 2017-01-18 NOTE — Progress Notes (Addendum)
L&D Note  01/18/2017 - 8:58 AM  26 y.o. G1 [redacted]w[redacted]d. Pregnancy complicated by idiopathic poly  Patient Active Problem List   Diagnosis Date Noted  . Fall at home, initial encounter 01/17/2017  . Supervision of high risk pregnancy, antepartum 01/15/2017  . Polyhydramnios in second trimester, antepartum complication 01/15/2017    [name] is admitted for obs after fall at 1330 on 9/19   Subjective:  No VB, decreased FM, abdominal pain or contractions. Pt feels well  Objective:    Current Vital Signs 24h Vital Sign Ranges  T 98.1 F (36.7 C) Temp  Avg: 98.3 F (36.8 C)  Min: 98.1 F (36.7 C)  Max: 98.6 F (37 C)  BP (!) 106/58 BP  Min: 103/62  Max: 109/61  HR 92 Pulse  Avg: 81.3  Min: 62  Max: 92  RR 16 Resp  Avg: 16.5  Min: 16  Max: 18  SaO2 100 % Not Delivered SpO2  Avg: 100 %  Min: 100 %  Max: 100 %       24 Hour I/O Current Shift I/O  Time Ins Outs No intake/output data recorded. No intake/output data recorded.   FHR: 145 baseline, no accels or decels, mod var Toco: +irritability, rare ?UCs Gen: NAD SVE: cl (ext os 1cm)/long/high. No blood on glove  Labs:   Recent Labs Lab 01/17/17 1940  WBC 10.9*  HGB 10.6*  HCT 31.2*  PLT 256    Recent Labs Lab 01/17/17 1940  NA 135  K 4.2  CL 104  CO2 24  BUN 6  CREATININE 0.61  CALCIUM 9.3  GLUCOSE 77   O POS  Medications Current Facility-Administered Medications  Medication Dose Route Frequency Provider Last Rate Last Dose  . acetaminophen (TYLENOL) tablet 650 mg  650 mg Oral Q4H PRN Arabella Merles, CNM      . calcium carbonate (TUMS - dosed in mg elemental calcium) chewable tablet 400 mg of elemental calcium  2 tablet Oral Q4H PRN Arabella Merles, CNM      . docusate sodium (COLACE) capsule 100 mg  100 mg Oral Daily Arabella Merles, CNM      . lactated ringers infusion   Intravenous Continuous Arabella Merles, CNM 125 mL/hr at 01/18/17 4098    . prenatal multivitamin tablet 1 tablet  1 tablet  Oral Q1200 Arabella Merles, CNM      . zolpidem (AMBIEN) tablet 5 mg  5 mg Oral QHS PRN Arabella Merles, CNM        Assessment & Plan:  Pt doing well *IUP: category I and appropriate for GA *Obs s/p fall: likely due to idiopathic poly. mvp 9.9 last night on prelm and negative placenta. Will d/c to home with precautions. Pt asked if okay to work today and I told her it was.   Cornelia Copa MD Attending Center for [facility])

## 2017-01-18 NOTE — Discharge Instructions (Signed)
Preventing Injuries During Pregnancy Injuries can happen during pregnancy. Minor falls and accidents usually do not harm you or your baby. But some injuries can harm you and your baby. Tell your doctor about any injury you suffer. What can I do to avoid injuries? Safety  Remove rugs and loose objects on the floor.  Wear comfortable shoes that have a good grip. Do not wear shoes that have high heels.  Always wear your seat belt in the car. The lap belt should be below your belly. Always drive safely.  Do not ride on a motorcycle. Activity  Do not take part in rough and violent activities or sports.  Avoid: ? Walking on wet or slippery floors. ? Lifting heavy pots of boiling or hot liquids. ? Fixing electrical problems. ? Being near fires. General instructions  Take over-the-counter and prescription medicines only as told by your doctor.  Know your blood type and the blood type of the baby's father.  If you are a victim of domestic violence: ? Call your local emergency services (911 in the [place] ? Contact the National Domestic Violence Hotline for help and support. Get help right away if:  You fall on your belly or receive any serious blow to your belly.  You have a stiff neck or neck pain after a fall or an injury.  You get a headache or have problems with vision after an injury.  You do not feel the baby move or the baby is not moving as much as normal.  You have been a victim of domestic violence or any other kind of attack.  You have been in a car accident.  You have bleeding from your vagina.  Fluid is leaking from your vagina.  You start to have cramping or pain in your belly (contractions).  You have very bad pain in your lower back.  You feel weak or pass out (faint).  You start to throw up (vomit) after an injury.  You have been burned. Summary  Some injuries that happen during pregnancy can do harm to the baby.  Tell your doctor about any  injury.  Take steps to avoid injury. This includes removing rugs and loose objects on the floor. Always wear your seat belt in the car.  Do not take part in rough and violent activities or sports.  Get help right away if you have any serious accident or injury. This information is not intended to replace advice given to you by your health care provider. Make sure you discuss any questions you have with your health care provider. Document Released: 05/20/2010 Document Revised: 04/26/2016 Document Reviewed: 04/26/2016 Elsevier Interactive Patient Education  2017 [employer]  

## 2017-01-23 ENCOUNTER — Other Ambulatory Visit (HOSPITAL_COMMUNITY): Payer: Self-pay | Admitting: *Deleted

## 2017-01-23 ENCOUNTER — Ambulatory Visit (HOSPITAL_COMMUNITY)
Admission: RE | Admit: 2017-01-23 | Discharge: 2017-01-23 | Disposition: A | Discharge status: Home / Self Care | Payer: Federal, State, Local not specified - PPO | Source: Ambulatory Visit | Attending: Obstetrics & Gynecology | Admitting: Obstetrics & Gynecology

## 2017-01-23 ENCOUNTER — Encounter (HOSPITAL_COMMUNITY): Payer: Self-pay

## 2017-01-23 ENCOUNTER — Other Ambulatory Visit: Payer: Self-pay | Admitting: Obstetrics & Gynecology

## 2017-01-23 DIAGNOSIS — O0932 Supervision of pregnancy with insufficient antenatal care, second trimester: Secondary | ICD-10-CM | POA: Diagnosis not present

## 2017-01-23 DIAGNOSIS — Z3A26 26 weeks gestation of pregnancy: Secondary | ICD-10-CM | POA: Diagnosis not present

## 2017-01-23 DIAGNOSIS — O0990 Supervision of high risk pregnancy, unspecified, unspecified trimester: Secondary | ICD-10-CM

## 2017-01-23 DIAGNOSIS — O402XX0 Polyhydramnios, second trimester, not applicable or unspecified: Secondary | ICD-10-CM

## 2017-01-23 DIAGNOSIS — Z3689 Encounter for other specified antenatal screening: Secondary | ICD-10-CM | POA: Insufficient documentation

## 2017-01-23 DIAGNOSIS — O409XX0 Polyhydramnios, unspecified trimester, not applicable or unspecified: Secondary | ICD-10-CM

## 2017-01-31 ENCOUNTER — Ambulatory Visit (INDEPENDENT_AMBULATORY_CARE_PROVIDER_SITE_OTHER): Payer: Federal, State, Local not specified - PPO | Admitting: Family Medicine

## 2017-01-31 DIAGNOSIS — O0930 Supervision of pregnancy with insufficient antenatal care, unspecified trimester: Secondary | ICD-10-CM | POA: Insufficient documentation

## 2017-01-31 DIAGNOSIS — O0990 Supervision of high risk pregnancy, unspecified, unspecified trimester: Secondary | ICD-10-CM

## 2017-01-31 DIAGNOSIS — O0932 Supervision of pregnancy with insufficient antenatal care, second trimester: Secondary | ICD-10-CM

## 2017-01-31 DIAGNOSIS — O0992 Supervision of high risk pregnancy, unspecified, second trimester: Secondary | ICD-10-CM

## 2017-01-31 NOTE — Patient Instructions (Signed)
Polyhydramnios When a woman becomes pregnant, a sac is formed around the fertilized egg (embryo) and later the growing baby (fetus). This sac is called the amniotic sac. The amniotic sac is filled with fluid. It gets bigger as the pregnancy grows. When there is too much fluid in the sac, it is called polyhydramnios. All babies born with polyhydramnios should be checked for congenital abnormalities. The amniotic fluid cushions and protects the baby. It also provides the baby with fluids and is crucial to normal development. Your baby breathes this fluid into its lungs and swallows it. This helps promote the healthy growth of the lungs and gastrointestinal tract. Amniotic fluid also helps the baby move around, helping with the normal development of muscle and bone. What are the causes? This condition is caused by:  Diabetes mellitus.  Down's syndrome, fetal abnormalities of the intestinal tract, and anencephaly (fetus without brain), all of which can prevent the fetus from swallowing the amniotic fluid.  One twins passes (transfuses) its blood into the other twin (twin-twin transfusion syndrome).  Medical illness of the mother, e.g., kidney or heart disease.  Tumor (chorioangioma) of the placenta.  What are the signs or symptoms? Symptoms of this condition include:  Enlarged uterus. The size of the uterus enlarges beyond what it should be for that particular time of the pregnancy.  Pressure and discomfort. The mother may feel more pressure and discomfort than should be expected.  Quick and unexpected enlargement of the mother's stomach.  How is this diagnosed? This condition is diagnosed when your health care provider measures you and notices that your uterus is beyond the size that is consistent with the time of the pregnancy.  An ultrasound is then used (abdominally or vaginally) to: ? See if you are carrying twins or more. ? Measure the growth of the baby. ? Look for birth  defects. ? Measure the amount of fluid in the amniotic sac.  Amniotic Fluid Index (AFI) measures the amount of fluid in the amniotic sac in four different areas. If there is more than 24 centimeters, you have polyhydramnios. How is this treated? This condition may be treated by:  Removing some fluid from the amniotic sac.  Taking medications that lower the fluids in your body.  Stopping the use of salt or salty foods because it causes you to keep fluid in your body (retention).  Follow these instructions at home:  Keep all your prenatal visits as told by your health care provider. It is important to follow your health care provider's recommendations.  Do not eat a lot of salt and salty foods.  If you have diabetes, keep it under control.  If you have heart or kidney disease, treat the disease as told by your health care provider. Contact a health care provider if:  You think your uterus has grown too fast in a short period of time.  You feel a great amount of pressure in your lower belly (pelvis) and are more uncomfortable than expected. Get help right away if:  You have a gush of fluid or are leaking fluid from your vagina.  You stop feeling the baby move.  You do not feel the baby kicking as much as usual.  You have a hard time keeping your diabetes under control.  You are having problems with your heart or kidney disease. This information is not intended to replace advice given to you by your health care provider. Make sure you discuss any questions you have with your health   care provider. Document Released: 07/08/2002 Document Revised: 09/23/2015 Document Reviewed: 11/14/2012 Elsevier Interactive Patient Education  2018 Elsevier Inc.  

## 2017-01-31 NOTE — Progress Notes (Signed)
   PRENATAL VISIT NOTE  Subjective:  Andrea Foster is a 26 y.o. G1P0000 at [redacted]w[redacted]d being seen today for ongoing prenatal care.  She is currently monitored for the following issues for this high-risk pregnancy and has Supervision of high risk pregnancy, antepartum; Polyhydramnios in second trimester, antepartum complication; Fall at home, initial encounter; and Late prenatal care affecting pregnancy on her problem list.  Patient reports no complaints.  Contractions: Not present. Vag. Bleeding: None.  Movement: Present. Denies leaking of fluid.   The following portions of the patient's history were reviewed and updated as appropriate: allergies, current medications, past family history, past medical history, past social history, past surgical history and problem list. Problem list updated.  Objective:   Vitals:   01/31/17 1424  BP: (!) 104/56  Pulse: 66  Weight: 161 lb 8 oz (73.3 kg)    Fetal Status: Fetal Heart Rate (bpm): 156 Fundal Height: 29 cm Movement: Present     General:  Alert, oriented and cooperative. Patient is in no acute distress.  Skin: Skin is warm and dry. No rash noted.   Cardiovascular: Normal heart rate noted  Respiratory: Normal respiratory effort, no problems with respiration noted  Abdomen: Soft, gravid, appropriate for gestational age.  Pain/Pressure: Absent     Pelvic: Cervical exam deferred        Extremities: Normal range of motion.  Edema: None  Mental Status:  Normal mood and affect. Normal behavior. Normal judgment and thought content.   Assessment and Plan:  Pregnancy: G1P0000 at [redacted]w[redacted]d  1. Supervision of high risk pregnancy, antepartum Routine care. 28 week labs drawn today. Passed 1 hour GTT so will not repeat today. Declined flu. Will need Tdap at next visit.  Preterm labor symptoms and general obstetric precautions including but not limited to vaginal bleeding, contractions, leaking of fluid and fetal movement were reviewed in detail with the  patient. Please refer to After Visit Summary for other counseling recommendations.  Return in about 2 weeks (around 02/14/2017).   Rolm Bookbinder, DO

## 2017-01-31 NOTE — Addendum Note (Signed)
Addended by: Lorelle Gibbs L on: 01/31/2017 05:04 PM   Modules accepted: Orders

## 2017-02-01 LAB — HIV ANTIBODY (ROUTINE TESTING W REFLEX): HIV Screen 4th Generation wRfx: NONREACTIVE

## 2017-02-01 LAB — CBC
Hematocrit: 29.1 % — ABNORMAL LOW (ref 34.0–46.6)
Hemoglobin: 10 g/dL — ABNORMAL LOW (ref 11.1–15.9)
MCH: 30.1 pg (ref 26.6–33.0)
MCHC: 34.4 g/dL (ref 31.5–35.7)
MCV: 88 fL (ref 79–97)
Platelets: 275 10*3/uL (ref 150–379)
RBC: 3.32 x10E6/uL — ABNORMAL LOW (ref 3.77–5.28)
RDW: 13.6 % (ref 12.3–15.4)
WBC: 7.5 10*3/uL (ref 3.4–10.8)

## 2017-02-06 ENCOUNTER — Encounter (HOSPITAL_COMMUNITY): Payer: Self-pay

## 2017-02-06 ENCOUNTER — Other Ambulatory Visit (HOSPITAL_COMMUNITY): Payer: Self-pay | Admitting: Maternal and Fetal Medicine

## 2017-02-06 ENCOUNTER — Ambulatory Visit (HOSPITAL_COMMUNITY)
Admission: RE | Admit: 2017-02-06 | Discharge: 2017-02-06 | Disposition: A | Discharge status: Home / Self Care | Payer: Federal, State, Local not specified - PPO | Source: Ambulatory Visit | Attending: Obstetrics & Gynecology | Admitting: Obstetrics & Gynecology

## 2017-02-06 DIAGNOSIS — O403XX0 Polyhydramnios, third trimester, not applicable or unspecified: Secondary | ICD-10-CM | POA: Diagnosis not present

## 2017-02-06 DIAGNOSIS — O409XX0 Polyhydramnios, unspecified trimester, not applicable or unspecified: Secondary | ICD-10-CM

## 2017-02-06 DIAGNOSIS — Z3A28 28 weeks gestation of pregnancy: Secondary | ICD-10-CM | POA: Insufficient documentation

## 2017-02-14 ENCOUNTER — Inpatient Hospital Stay (HOSPITAL_COMMUNITY)
Admission: AD | Admit: 2017-02-14 | Discharge: 2017-02-14 | Disposition: A | Discharge status: Home / Self Care | Payer: Federal, State, Local not specified - PPO | Source: Ambulatory Visit | Attending: Obstetrics and Gynecology | Admitting: Obstetrics and Gynecology

## 2017-02-14 ENCOUNTER — Encounter (HOSPITAL_COMMUNITY): Payer: Self-pay

## 2017-02-14 DIAGNOSIS — O26893 Other specified pregnancy related conditions, third trimester: Secondary | ICD-10-CM | POA: Diagnosis not present

## 2017-02-14 DIAGNOSIS — Z3A29 29 weeks gestation of pregnancy: Secondary | ICD-10-CM | POA: Insufficient documentation

## 2017-02-14 DIAGNOSIS — R42 Dizziness and giddiness: Secondary | ICD-10-CM | POA: Diagnosis not present

## 2017-02-14 DIAGNOSIS — E860 Dehydration: Secondary | ICD-10-CM | POA: Insufficient documentation

## 2017-02-14 DIAGNOSIS — R531 Weakness: Secondary | ICD-10-CM | POA: Diagnosis not present

## 2017-02-14 LAB — COMPREHENSIVE METABOLIC PANEL
ALT: 18 U/L (ref 14–54)
AST: 25 U/L (ref 15–41)
Albumin: 3.2 g/dL — ABNORMAL LOW (ref 3.5–5.0)
Alkaline Phosphatase: 102 U/L (ref 38–126)
Anion gap: 8 (ref 5–15)
BUN: 8 mg/dL (ref 6–20)
CO2: 24 mmol/L (ref 22–32)
Calcium: 9.1 mg/dL (ref 8.9–10.3)
Chloride: 103 mmol/L (ref 101–111)
Creatinine, Ser: 0.66 mg/dL (ref 0.44–1.00)
GFR calc Af Amer: >60 mL/min (ref >60)
GFR calc non Af Amer: >60 mL/min (ref >60)
Glucose, Bld: 96 mg/dL (ref 65–99)
Potassium: 3.9 mmol/L (ref 3.5–5.1)
Sodium: 135 mmol/L (ref 135–145)
Total Bilirubin: 0.3 mg/dL (ref 0.3–1.2)
Total Protein: 7.8 g/dL (ref 6.5–8.1)

## 2017-02-14 LAB — CBC
HCT: 31.9 % — ABNORMAL LOW (ref 36.0–46.0)
Hemoglobin: 10.6 g/dL — ABNORMAL LOW (ref 12.0–15.0)
MCH: 30 pg (ref 26.0–34.0)
MCHC: 33.2 g/dL (ref 30.0–36.0)
MCV: 90.4 fL (ref 78.0–100.0)
Platelets: 261 10*3/uL (ref 150–400)
RBC: 3.53 MIL/uL — ABNORMAL LOW (ref 3.87–5.11)
RDW: 12.8 % (ref 11.5–15.5)
WBC: 9.7 10*3/uL (ref 4.0–10.5)

## 2017-02-14 LAB — URINALYSIS, ROUTINE W REFLEX MICROSCOPIC
Bilirubin Urine: NEGATIVE
Glucose, UA: NEGATIVE mg/dL
Hgb urine dipstick: NEGATIVE
Ketones, ur: 15 mg/dL — AB
Nitrite: NEGATIVE
Protein, ur: NEGATIVE mg/dL
Specific Gravity, Urine: 1.01 (ref 1.005–1.030)
pH: 7 (ref 5.0–8.0)

## 2017-02-14 LAB — URINALYSIS, MICROSCOPIC (REFLEX)
Bacteria, UA: NONE SEEN
RBC / HPF: NONE SEEN RBC/hpf (ref 0–5)

## 2017-02-14 NOTE — MAU Provider Note (Signed)
Chief Complaint:  Dizziness and Fatigue   First Provider Initiated Contact with Patient 02/14/17 1851      HPI: Andrea Foster is a 26 y.o. G1P0000 at 305w4d who presents to maternity admissions reporting she left work because of episodes of weakness and fatigue today. These episodes started at her job at [employer] at 3 pm so she drank some water.  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 She reports she had not eaten anything all day before this time.  She had [employer] on the way to MAU and does report feeling better after eating. She said this was unusual that she does eat more regularly on a normal day.  She denies any abdominal pain, n/v, or other associated symptoms.   She reports good fetal movement.   HPI  Past Medical History: Past Medical History:  Diagnosis Date  . Asthma    seasonal as a child  . Gonorrhea   . Hx of chlamydia infection 2013    Past obstetric history: OB History  Gravida Para Term Preterm AB Living  1 0 0 0 0 0  SAB TAB Ectopic Multiple Live Births  0 0 0 0 0    # Outcome Date GA Lbr Len/2nd Weight Sex Delivery Anes PTL Lv  1 Current               Past Surgical History: Past Surgical History:  Procedure Laterality Date  . NO PAST SURGERIES      Family History: Family History  Problem Relation Age of Onset  . Cancer Maternal Grandfather        ? agent orange  . Asthma Neg Hx   . Diabetes Neg Hx   . Heart disease Neg Hx   . Hypertension Neg Hx   . Stroke Neg Hx     Social History: Social History  Substance Use Topics  . Smoking status: Never Smoker  . Smokeless tobacco: Never Used  . Alcohol use No     Comment: rare, spicial occasions, not while preg    Allergies:  Allergies  Allergen Reactions  . Latex Hives    Meds:  Prescriptions Prior to Admission  Medication Sig Dispense Refill Last Dose  . loratadine (CLARITIN) 10 MG tablet Take 10 mg by mouth daily as needed for  allergies.   Not Taking  . prenatal vitamin w/FE, FA (NATACHEW) 29-1 MG CHEW chewable tablet Chew 1 tablet by mouth daily at 12 noon.   Taking    ROS:  Review of Systems  Constitutional: Positive for fatigue. Negative for chills and fever.  Eyes: Negative for visual disturbance.  Respiratory: Negative for shortness of breath.   Cardiovascular: Negative for chest pain.  Gastrointestinal: Negative for abdominal pain, nausea and vomiting.  Genitourinary: Negative for difficulty urinating, dysuria, flank pain, pelvic pain, vaginal bleeding, vaginal discharge and vaginal pain.  Neurological: Positive for dizziness and weakness. Negative for headaches.  Psychiatric/Behavioral: Negative.      I have reviewed patient's Past Medical Hx, Surgical Hx, Family Hx, Social Hx, medications and allergies.   Physical Exam   Patient Vitals for the past 24 hrs:  BP Temp Temp src Pulse Resp SpO2 Weight  02/14/17 1804 104/64 98.3 F (36.8 C) Oral 87 18 100 % 164 lb (74.4 kg)   Constitutional: Well-developed, well-nourished female in no acute distress.  HEART: normal rate, heart sounds, regular rhythm RESP: normal effort, lung sounds clear and equal  bilaterally GI: Abd soft, non-tender, gravid appropriate for gestational age.  MS: Extremities nontender, no edema, normal ROM Neurologic: Alert and oriented x 4.  GU: Neg CVAT.      FHT:  Baseline 140 , moderate variability, accelerations present, no decelerations Contractions: rare, mild to palpation   Labs: Results for orders placed or performed during the hospital encounter of 02/14/17 (from the past 24 hour(s))  Urinalysis, Routine w reflex microscopic     Status: Abnormal   Collection Time: 02/14/17  6:07 PM  Result Value Ref Range   Color, Urine YELLOW YELLOW   APPearance CLEAR CLEAR   Specific Gravity, Urine 1.010 1.005 - 1.030   pH 7.0 5.0 - 8.0   Glucose, UA NEGATIVE NEGATIVE mg/dL   Hgb urine dipstick NEGATIVE NEGATIVE   Bilirubin  Urine NEGATIVE NEGATIVE   Ketones, ur 15 (A) NEGATIVE mg/dL   Protein, ur NEGATIVE NEGATIVE mg/dL   Nitrite NEGATIVE NEGATIVE   Leukocytes, UA SMALL (A) NEGATIVE  Urinalysis, Microscopic (reflex)     Status: Abnormal   Collection Time: 02/14/17  6:07 PM  Result Value Ref Range   RBC / HPF NONE SEEN 0 - 5 RBC/hpf   WBC, UA 0-5 0 - 5 WBC/hpf   Bacteria, UA NONE SEEN NONE SEEN   Squamous Epithelial / LPF 0-5 (A) NONE SEEN   Urine-Other MICROSCOPIC EXAM PERFORMED ON UNCONCENTRATED URINE   CBC     Status: Abnormal   Collection Time: 02/14/17  6:26 PM  Result Value Ref Range   WBC 9.7 4.0 - 10.5 K/uL   RBC 3.53 (L) 3.87 - 5.11 MIL/uL   Hemoglobin 10.6 (L) 12.0 - 15.0 g/dL   HCT 16.1 (L) 09.6 - 04.5 %   MCV 90.4 78.0 - 100.0 fL   MCH 30.0 26.0 - 34.0 pg   MCHC 33.2 30.0 - 36.0 g/dL   RDW 40.9 81.1 - 91.4 %   Platelets 261 150 - 400 K/uL  Comprehensive metabolic panel     Status: Abnormal   Collection Time: 02/14/17  6:26 PM  Result Value Ref Range   Sodium 135 135 - 145 mmol/L   Potassium 3.9 3.5 - 5.1 mmol/L   Chloride 103 101 - 111 mmol/L   CO2 24 22 - 32 mmol/L   Glucose, Bld 96 65 - 99 mg/dL   BUN 8 6 - 20 mg/dL   Creatinine, Ser 7.82 0.44 - 1.00 mg/dL   Calcium 9.1 8.9 - 95.6 mg/dL   Total Protein 7.8 6.5 - 8.1 g/dL   Albumin 3.2 (L) 3.5 - 5.0 g/dL   AST 25 15 - 41 U/L   ALT 18 14 - 54 U/L   Alkaline Phosphatase 102 38 - 126 U/L   Total Bilirubin 0.3 0.3 - 1.2 mg/dL   GFR calc non Af Amer >60 >60 mL/min   GFR calc Af Amer >60 >60 mL/min   Anion gap 8 5 - 15   --/--/O POS (09/19 1940)  Imaging:    MAU Course/MDM: I have ordered labs and reviewed results.  NST reviewed and reactive CBC, CMP wnl. U/A with small amount of ketones c/w poor food intake today Likely pt symptoms from lack of food/drink while working Encouraged pt to eat/drink more regularly Keep scheduled appts in [facility] office, return to MAU as needed for emergencies Pt stable at time of  discharge.    Assessment: 1. Dizziness   2. Mild dehydration   3. [redacted] weeks gestation of pregnancy  Plan: Discharge home Labor precautions and fetal kick counts Follow-up Information    [facility] Follow up.   Specialty:  Obstetrics and Gynecology Why:  As scheduled, return to MAU as needed for emergencies Contact information: [address] [place] [address] [phone]         Allergies as of 02/14/2017      Reactions   Latex Hives      Medication List    TAKE these medications   loratadine 10 MG tablet Commonly known as:  CLARITIN Take 10 mg by mouth daily as needed for allergies.   prenatal vitamin w/FE, FA 29-1 MG Chew chewable tablet Chew 1 tablet by mouth daily at 12 noon.       Sharen Counter Certified Nurse-Midwife 02/14/2017 7:03 PM

## 2017-02-14 NOTE — MAU Note (Signed)
was at work, feeling light headed. Put her head down for about 30 min, felt better. Is on her feet at work, started feeling weak and light headed again.  On call nurse told her to come in .

## 2017-02-14 NOTE — Progress Notes (Addendum)
G1 @ 29.[redacted] wksga. Presents to triage for dizziness at work. Denies LOF or bleeding or ctx. +FM. EFM applied. VSS. See flow sheet for details.   States had not eaten all day until about 40 minutes ago [facility] here and now feeling better.   Provider at bs assessing pt.   1910: Discharge instructions given with pt understanding. Pt left unit via ambulatory with SO.

## 2017-02-15 ENCOUNTER — Ambulatory Visit (INDEPENDENT_AMBULATORY_CARE_PROVIDER_SITE_OTHER): Payer: Federal, State, Local not specified - PPO | Admitting: Family Medicine

## 2017-02-15 VITALS — BP 104/58 | HR 106 | Wt 167.0 lb

## 2017-02-15 DIAGNOSIS — O0990 Supervision of high risk pregnancy, unspecified, unspecified trimester: Secondary | ICD-10-CM

## 2017-02-15 DIAGNOSIS — O0993 Supervision of high risk pregnancy, unspecified, third trimester: Secondary | ICD-10-CM

## 2017-02-15 DIAGNOSIS — O402XX0 Polyhydramnios, second trimester, not applicable or unspecified: Secondary | ICD-10-CM

## 2017-02-15 NOTE — Patient Instructions (Signed)
AREA PEDIATRIC/FAMILY PRACTICE PHYSICIANS  [facility] [address], [address] [place], [place]  [address] Phone - [phone]   Fax - [phone]  [facility] [address] [place], [place] [address] Phone - [phone]   Fax - [phone]  [facility] [address] [place], [place]  [address] Phone - [phone]   Fax - [phone]  [facility] [address] [place], [place]  [address] Phone - [phone]   Fax - [phone]   [facility] [address] [place], [place]  [address] Phone - [phone]   Fax - [phone]  [facility] [address] [place], [place]  [address] Phone - [phone]   Fax - [phone]  [facility] [address] [place], [place]  [address] Phone - [phone]   Fax - [phone]  [facility] [address] [place], [place]  [address] Phone - [phone]   Fax - [phone]  [facility] [address] [place], [place]  [address] Phone - [phone]   Fax - [phone] [facility] [address] [place], [place]  [address] Phone - [phone]   Fax - [phone]  [facility] [address] [place], [place]  [address] Phone - [phone]   Fax - [phone]  [facility] [address] [place], [place]  [address] Phone - [phone]   Fax - [phone]  [facility] [address] [place], [place]  [address] Phone - [phone]   Fax - [phone]  [facility] [address] [place], [place]  [address] Phone - [phone]  [facility] [address] [place], [place]  [address] Phone - [phone]   Fax - [phone]  [facility] [address] [place], [place]  [address] Phone - [phone]   Fax - [phone]  [facility] [address] [place], [place]  [address] Phone - [phone]   Fax - [phone]  [facility] [address] [place], [place]  [address] Phone - [phone]   Fax - [phone]   [facility] [address] [place], [place]  [address] Phone - [phone]   Fax - [phone]  [facility] [address] [place], [place]  [address] Phone - [phone]   Fax - [phone]  [facility] [address] [place], [place]  [address] Phone - [phone]   Fax - [phone]  [facility] [address] [place], [place]  [address] Phone - [phone]   Fax - [phone]  [facility] [address] , [place]  [address] Phone - [phone]   Fax - [phone] [facility] [address] [place], [place]  [address] Phone - [phone]   Fax - [phone]  [facility] [address] [place], [place]  [address] Phone - [phone]   Fax - [phone]  [facility] [address] [place], [place]  [address] Phone - [phone]   Fax - [phone]  [facility] Charlene Flemming MD [address] [place]  [address] Phone [phone]  Fax [phone]   

## 2017-02-15 NOTE — Progress Notes (Signed)
Pt stated went to ED

## 2017-02-15 NOTE — Progress Notes (Signed)
   PRENATAL VISIT NOTE  Subjective:  Andrea Foster is a 26 y.o. G1P0000 at 2562w5d being seen today for ongoing prenatal care.  She is currently monitored for the following issues for this high-risk pregnancy and has Supervision of high risk pregnancy, antepartum; Polyhydramnios in second trimester, antepartum complication; Fall at home, initial encounter; and Late prenatal care affecting pregnancy on her problem list.  Patient reports no complaints.  Contractions: Not present. Vag. Bleeding: None.  Movement: Present. Denies leaking of fluid.   The following portions of the patient's history were reviewed and updated as appropriate: allergies, current medications, past family history, past medical history, past social history, past surgical history and problem list. Problem list updated.  Objective:   Vitals:   02/15/17 1238  BP: (!) 104/58  Pulse: (!) 106  Weight: 167 lb (75.8 kg)    Fetal Status: Fetal Heart Rate (bpm): 150   Movement: Present     General:  Alert, oriented and cooperative. Patient is in no acute distress.  Skin: Skin is warm and dry. No rash noted.   Cardiovascular: Normal heart rate noted  Respiratory: Normal respiratory effort, no problems with respiration noted  Abdomen: Soft, gravid, appropriate for gestational age.  Pain/Pressure: Absent     Pelvic: Cervical exam deferred        Extremities: Normal range of motion.  Edema: None  Mental Status:  Normal mood and affect. Normal behavior. Normal judgment and thought content.   Assessment and Plan:  Pregnancy: G1P0000 at 3762w5d  1. Supervision of high risk pregnancy, antepartum FHT normal. FH elevated  2. Polyhydramnios in second trimester, single or unspecified fetus US next week for growth  Preterm labor symptoms and general obstetric precautions including but not limited to vaginal bleeding, contractions, leaking of fluid and fetal movement were reviewed in detail with the patient. Please refer to After  Visit Summary for other counseling recommendations.  Return in about 2 weeks (around 03/01/2017) for OB f/u.   Levie HeritageJacob J Stinson, DO

## 2017-02-20 ENCOUNTER — Other Ambulatory Visit (HOSPITAL_COMMUNITY): Payer: Self-pay | Admitting: *Deleted

## 2017-02-20 ENCOUNTER — Encounter (HOSPITAL_COMMUNITY): Payer: Self-pay

## 2017-02-20 ENCOUNTER — Other Ambulatory Visit (HOSPITAL_COMMUNITY): Payer: Self-pay | Admitting: Maternal and Fetal Medicine

## 2017-02-20 ENCOUNTER — Ambulatory Visit (HOSPITAL_COMMUNITY)
Admission: RE | Admit: 2017-02-20 | Discharge: 2017-02-20 | Disposition: A | Discharge status: Home / Self Care | Payer: Federal, State, Local not specified - PPO | Source: Ambulatory Visit | Attending: Obstetrics & Gynecology | Admitting: Obstetrics & Gynecology

## 2017-02-20 DIAGNOSIS — O403XX0 Polyhydramnios, third trimester, not applicable or unspecified: Secondary | ICD-10-CM | POA: Diagnosis not present

## 2017-02-20 DIAGNOSIS — O409XX0 Polyhydramnios, unspecified trimester, not applicable or unspecified: Secondary | ICD-10-CM

## 2017-02-20 DIAGNOSIS — Z3A30 30 weeks gestation of pregnancy: Secondary | ICD-10-CM

## 2017-02-20 DIAGNOSIS — Z362 Encounter for other antenatal screening follow-up: Secondary | ICD-10-CM

## 2017-02-20 DIAGNOSIS — O09893 Supervision of other high risk pregnancies, third trimester: Secondary | ICD-10-CM | POA: Diagnosis not present

## 2017-03-02 ENCOUNTER — Ambulatory Visit (INDEPENDENT_AMBULATORY_CARE_PROVIDER_SITE_OTHER): Payer: Federal, State, Local not specified - PPO | Admitting: Obstetrics and Gynecology

## 2017-03-02 ENCOUNTER — Encounter: Payer: Self-pay | Admitting: *Deleted

## 2017-03-02 ENCOUNTER — Other Ambulatory Visit (HOSPITAL_COMMUNITY)
Admission: RE | Admit: 2017-03-02 | Discharge: 2017-03-02 | Disposition: A | Discharge status: Home / Self Care | Payer: Federal, State, Local not specified - PPO | Source: Ambulatory Visit | Attending: Obstetrics and Gynecology | Admitting: Obstetrics and Gynecology

## 2017-03-02 ENCOUNTER — Telehealth: Payer: Self-pay | Admitting: *Deleted

## 2017-03-02 VITALS — BP 100/72 | HR 90 | Wt 169.5 lb

## 2017-03-02 DIAGNOSIS — O0990 Supervision of high risk pregnancy, unspecified, unspecified trimester: Secondary | ICD-10-CM | POA: Diagnosis not present

## 2017-03-02 DIAGNOSIS — O402XX0 Polyhydramnios, second trimester, not applicable or unspecified: Secondary | ICD-10-CM

## 2017-03-02 DIAGNOSIS — O0993 Supervision of high risk pregnancy, unspecified, third trimester: Secondary | ICD-10-CM

## 2017-03-02 DIAGNOSIS — Z3A32 32 weeks gestation of pregnancy: Secondary | ICD-10-CM | POA: Diagnosis not present

## 2017-03-02 DIAGNOSIS — Z113 Encounter for screening for infections with a predominantly sexual mode of transmission: Secondary | ICD-10-CM | POA: Diagnosis not present

## 2017-03-02 DIAGNOSIS — Z23 Encounter for immunization: Secondary | ICD-10-CM

## 2017-03-02 DIAGNOSIS — O403XX0 Polyhydramnios, third trimester, not applicable or unspecified: Secondary | ICD-10-CM

## 2017-03-02 NOTE — Progress Notes (Signed)
Subjective:  Andrea Foster is a 26 y.o. G1P0000 at 6959w6d being seen today for ongoing prenatal care.  She is currently monitored for the following issues for this high-risk pregnancy and has Supervision of high risk pregnancy, antepartum; Polyhydramnios in second trimester, antepartum complication; and Late prenatal care affecting pregnancy on her problem list.  Patient reports no complaints.  Contractions: Irregular. Vag. Bleeding: None.  Movement: Present. Denies leaking of fluid.   The following portions of the patient's history were reviewed and updated as appropriate: allergies, current medications, past family history, past medical history, past social history, past surgical history and problem list. Problem list updated.  Objective:   Vitals:   03/02/17 1109  BP: 100/72  Pulse: 90  Weight: 76.9 kg (169 lb 8 oz)    Fetal Status: Fetal Heart Rate (bpm): 142   Movement: Present     General:  Alert, oriented and cooperative. Patient is in no acute distress.  Skin: Skin is warm and dry. No rash noted.   Cardiovascular: Normal heart rate noted  Respiratory: Normal respiratory effort, no problems with respiration noted  Abdomen: Soft, gravid, appropriate for gestational age. Pain/Pressure: Absent     Pelvic:  Cervical exam deferred        Extremities: Normal range of motion.  Edema: None  Mental Status: Normal mood and affect. Normal behavior. Normal judgment and thought content.   Urinalysis:      Assessment and Plan:  Pregnancy: G1P0000 at 5359w6d  1. Supervision of high risk pregnancy, antepartum Stable - Tdap vaccine greater than or equal to 7yo IM - GC/Chlamydia probe amp ([place])[facility] at [facility] - US MFM OB FOLLOW UP; Future  2. Polyhydramnios in second trimester, single or unspecified fetus As per new Society of Fetal Medicine and discussed with Dr Archie PattenBunce  With AFI < 30 and isolated with normal growth, no antenatal testing required Reviewed with pt and FOB F/U growth  scan ordered  Preterm labor symptoms and general obstetric precautions including but not limited to vaginal bleeding, contractions, leaking of fluid and fetal movement were reviewed in detail with the patient. Please refer to After Visit Summary for other counseling recommendations.  No Follow-up on file.   Hermina StaggersErvin, Mitsuo Budnick L, MD

## 2017-03-02 NOTE — Telephone Encounter (Signed)
MFM changed patient's u/s appt to 11/20 @ 1400. Attempted to call patient, got voice mail. Left message to return call regarding a change in her appointment.

## 2017-03-05 LAB — GC/CHLAMYDIA PROBE AMP (~~LOC~~) NOT AT ARMC
Chlamydia: NEGATIVE
Neisseria Gonorrhea: NEGATIVE

## 2017-03-06 ENCOUNTER — Ambulatory Visit (HOSPITAL_COMMUNITY): Payer: Federal, State, Local not specified - PPO

## 2017-03-08 ENCOUNTER — Ambulatory Visit (INDEPENDENT_AMBULATORY_CARE_PROVIDER_SITE_OTHER): Payer: Federal, State, Local not specified - PPO | Admitting: Obstetrics & Gynecology

## 2017-03-08 VITALS — BP 109/67 | HR 70 | Wt 170.0 lb

## 2017-03-08 DIAGNOSIS — O0990 Supervision of high risk pregnancy, unspecified, unspecified trimester: Secondary | ICD-10-CM

## 2017-03-08 DIAGNOSIS — O0993 Supervision of high risk pregnancy, unspecified, third trimester: Secondary | ICD-10-CM

## 2017-03-08 NOTE — Patient Instructions (Signed)

## 2017-03-08 NOTE — Progress Notes (Signed)
   PRENATAL VISIT NOTE  Subjective:  Andrea Foster is a 26 y.o. G1P0000 at 4870w5d being seen today for ongoing prenatal care.  She is currently monitored for the following issues for this low-risk pregnancy and has Supervision of high risk pregnancy, antepartum and Late prenatal care affecting pregnancy on their problem list.  Patient reports pelvic pressure especially when at work.  Contractions: Not present. Vag. Bleeding: None.  Movement: Present. Denies leaking of fluid.   The following portions of the patient's history were reviewed and updated as appropriate: allergies, current medications, past family history, past medical history, past social history, past surgical history and problem list. Problem list updated.  Objective:   Vitals:   03/08/17 1251  BP: 109/67  Pulse: 70  Weight: 170 lb (77.1 kg)    Fetal Status: Fetal Heart Rate (bpm): 145   Movement: Present     General:  Alert, oriented and cooperative. Patient is in no acute distress.  Skin: Skin is warm and dry. No rash noted.   Cardiovascular: Normal heart rate noted  Respiratory: Normal respiratory effort, no problems with respiration noted  Abdomen: Soft, gravid, appropriate for gestational age.  Pain/Pressure: Present     Pelvic: Cervical exam deferred        Extremities: Normal range of motion.  Edema: None  Mental Status:  Normal mood and affect. Normal behavior. Normal judgment and thought content.   Assessment and Plan:  Pregnancy: G1P0000 at 5070w5d  1. Supervision of high risk pregnancy, antepartum Discomforts of pregnancy, recommend support belt  Preterm labor symptoms and general obstetric precautions including but not limited to vaginal bleeding, contractions, leaking of fluid and fetal movement were reviewed in detail with the patient. Please refer to After Visit Summary for other counseling recommendations.  Return in about 2 weeks (around 03/22/2017).   Scheryl DarterJames Chirsty Armistead, MD

## 2017-03-09 ENCOUNTER — Encounter: Payer: Self-pay | Admitting: *Deleted

## 2017-03-09 NOTE — Telephone Encounter (Signed)
Letter mailed to patient.

## 2017-03-13 ENCOUNTER — Other Ambulatory Visit (HOSPITAL_COMMUNITY): Payer: Federal, State, Local not specified - PPO

## 2017-03-15 ENCOUNTER — Encounter: Payer: Federal, State, Local not specified - PPO | Admitting: Family Medicine

## 2017-03-20 ENCOUNTER — Other Ambulatory Visit: Payer: Self-pay | Admitting: Obstetrics and Gynecology

## 2017-03-20 ENCOUNTER — Ambulatory Visit (HOSPITAL_COMMUNITY)
Admission: RE | Admit: 2017-03-20 | Discharge: 2017-03-20 | Disposition: A | Discharge status: Home / Self Care | Payer: Federal, State, Local not specified - PPO | Source: Ambulatory Visit | Attending: Obstetrics and Gynecology | Admitting: Obstetrics and Gynecology

## 2017-03-20 ENCOUNTER — Ambulatory Visit (INDEPENDENT_AMBULATORY_CARE_PROVIDER_SITE_OTHER): Payer: Federal, State, Local not specified - PPO | Admitting: Family Medicine

## 2017-03-20 ENCOUNTER — Other Ambulatory Visit (HOSPITAL_COMMUNITY): Payer: Federal, State, Local not specified - PPO

## 2017-03-20 ENCOUNTER — Other Ambulatory Visit (HOSPITAL_COMMUNITY): Payer: Self-pay | Admitting: *Deleted

## 2017-03-20 ENCOUNTER — Encounter (HOSPITAL_COMMUNITY): Payer: Self-pay

## 2017-03-20 VITALS — BP 107/71 | HR 78 | Wt 179.5 lb

## 2017-03-20 DIAGNOSIS — O403XX0 Polyhydramnios, third trimester, not applicable or unspecified: Secondary | ICD-10-CM

## 2017-03-20 DIAGNOSIS — Z3A34 34 weeks gestation of pregnancy: Secondary | ICD-10-CM

## 2017-03-20 DIAGNOSIS — O0993 Supervision of high risk pregnancy, unspecified, third trimester: Secondary | ICD-10-CM

## 2017-03-20 DIAGNOSIS — O0990 Supervision of high risk pregnancy, unspecified, unspecified trimester: Secondary | ICD-10-CM

## 2017-03-20 DIAGNOSIS — O0933 Supervision of pregnancy with insufficient antenatal care, third trimester: Secondary | ICD-10-CM

## 2017-03-20 DIAGNOSIS — Z369 Encounter for antenatal screening, unspecified: Secondary | ICD-10-CM | POA: Insufficient documentation

## 2017-03-20 DIAGNOSIS — O409XX0 Polyhydramnios, unspecified trimester, not applicable or unspecified: Secondary | ICD-10-CM

## 2017-03-20 NOTE — Patient Instructions (Signed)
Breastfeeding Deciding to breastfeed is one of the best choices you can make for you and your baby. A change in hormones during pregnancy causes your breast tissue to grow and increases the number and size of your milk ducts. These hormones also allow proteins, sugars, and fats from your blood supply to make breast milk in your milk-producing glands. Hormones prevent breast milk from being released before your baby is born as well as prompt milk flow after birth. Once breastfeeding has begun, thoughts of your baby, as well as his or her sucking or crying, can stimulate the release of milk from your milk-producing glands. Benefits of breastfeeding For Your Baby  Your first milk (colostrum) helps your baby's digestive system function better.  There are antibodies in your milk that help your baby fight off infections.  Your baby has a lower incidence of asthma, allergies, and sudden infant death syndrome.  The nutrients in breast milk are better for your baby than infant formulas and are designed uniquely for your baby's needs.  Breast milk improves your baby's brain development.  Your baby is less likely to develop other conditions, such as childhood obesity, asthma, or type 2 diabetes mellitus.  For You  Breastfeeding helps to create a very special bond between you and your baby.  Breastfeeding is convenient. Breast milk is always available at the correct temperature and costs nothing.  Breastfeeding helps to burn calories and helps you lose the weight gained during pregnancy.  Breastfeeding makes your uterus contract to its prepregnancy size faster and slows bleeding (lochia) after you give birth.  Breastfeeding helps to lower your risk of developing type 2 diabetes mellitus, osteoporosis, and breast or ovarian cancer later in life.  Signs that your baby is hungry Early Signs of Hunger  Increased alertness or activity.  Stretching.  Movement of the head from side to  side.  Movement of the head and opening of the mouth when the corner of the mouth or cheek is stroked (rooting).  Increased sucking sounds, smacking lips, cooing, sighing, or squeaking.  Hand-to-mouth movements.  Increased sucking of fingers or hands.  Late Signs of Hunger  Fussing.  Intermittent crying.  Extreme Signs of Hunger Signs of extreme hunger will require calming and consoling before your baby will be able to breastfeed successfully. Do not wait for the following signs of extreme hunger to occur before you initiate breastfeeding:  Restlessness.  A loud, strong cry.  Screaming.  Breastfeeding basics Breastfeeding Initiation  Find a comfortable place to sit or lie down, with your neck and back well supported.  Place a pillow or rolled up blanket under your baby to bring him or her to the level of your breast (if you are seated). Nursing pillows are specially designed to help support your arms and your baby while you breastfeed.  Make sure that your baby's abdomen is facing your abdomen.  Gently massage your breast. With your fingertips, massage from your chest wall toward your nipple in a circular motion. This encourages milk flow. You may need to continue this action during the feeding if your milk flows slowly.  Support your breast with 4 fingers underneath and your thumb above your nipple. Make sure your fingers are well away from your nipple and your baby's mouth.  Stroke your baby's lips gently with your finger or nipple.  When your baby's mouth is open wide enough, quickly bring your baby to your breast, placing your entire nipple and as much of the colored area   around your nipple (areola) as possible into your baby's mouth. ? More areola should be visible above your baby's upper lip than below the lower lip. ? Your baby's tongue should be between his or her lower gum and your breast.  Ensure that your baby's mouth is correctly positioned around your nipple  (latched). Your baby's lips should create a seal on your breast and be turned out (everted).  It is common for your baby to suck about 2-3 minutes in order to start the flow of breast milk.  Latching Teaching your baby how to latch on to your breast properly is very important. An improper latch can cause nipple pain and decreased milk supply for you and poor weight gain in your baby. Also, if your baby is not latched onto your nipple properly, he or she may swallow some air during feeding. This can make your baby fussy. Burping your baby when you switch breasts during the feeding can help to get rid of the air. However, teaching your baby to latch on properly is still the best way to prevent fussiness from swallowing air while breastfeeding. Signs that your baby has successfully latched on to your nipple:  Silent tugging or silent sucking, without causing you pain.  Swallowing heard between every 3-4 sucks.  Muscle movement above and in front of his or her ears while sucking.  Signs that your baby has not successfully latched on to nipple:  Sucking sounds or smacking sounds from your baby while breastfeeding.  Nipple pain.  If you think your baby has not latched on correctly, slip your finger into the corner of your baby's mouth to break the suction and place it between your baby's gums. Attempt breastfeeding initiation again. Signs of Successful Breastfeeding Signs from your baby:  A gradual decrease in the number of sucks or complete cessation of sucking.  Falling asleep.  Relaxation of his or her body.  Retention of a small amount of milk in his or her mouth.  Letting go of your breast by himself or herself.  Signs from you:  Breasts that have increased in firmness, weight, and size 1-3 hours after feeding.  Breasts that are softer immediately after breastfeeding.  Increased milk volume, as well as a change in milk consistency and color by the fifth day of  breastfeeding.  Nipples that are not sore, cracked, or bleeding.  Signs That Your Baby is Getting Enough Milk  Wetting at least 1-2 diapers during the first 24 hours after birth.  Wetting at least 5-6 diapers every 24 hours for the first week after birth. The urine should be clear or pale yellow by 5 days after birth.  Wetting 6-8 diapers every 24 hours as your baby continues to grow and develop.  At least 3 stools in a 24-hour period by age 5 days. The stool should be soft and yellow.  At least 3 stools in a 24-hour period by age 7 days. The stool should be seedy and yellow.  No loss of weight greater than 10% of birth weight during the first 3 days of age.  Average weight gain of 4-7 ounces (113-198 g) per week after age 4 days.  Consistent daily weight gain by age 5 days, without weight loss after the age of 2 weeks.  After a feeding, your baby may spit up a small amount. This is common. Breastfeeding frequency and duration Frequent feeding will help you make more milk and can prevent sore nipples and breast engorgement. Breastfeed when   you feel the need to reduce the fullness of your breasts or when your baby shows signs of hunger. This is called "breastfeeding on demand." Avoid introducing a pacifier to your baby while you are working to establish breastfeeding (the first 4-6 weeks after your baby is born). After this time you may choose to use a pacifier. Research has shown that pacifier use during the first year of a baby's life decreases the risk of sudden infant death syndrome (SIDS). Allow your baby to feed on each breast as long as he or she wants. Breastfeed until your baby is finished feeding. When your baby unlatches or falls asleep while feeding from the first breast, offer the second breast. Because newborns are often sleepy in the first few weeks of life, you may need to awaken your baby to get him or her to feed. Breastfeeding times will vary from baby to baby. However,  the following rules can serve as a guide to help you ensure that your baby is properly fed:  Newborns (babies 4 weeks of age or younger) may breastfeed every 1-3 hours.  Newborns should not go longer than 3 hours during the day or 5 hours during the night without breastfeeding.  You should breastfeed your baby a minimum of 8 times in a 24-hour period until you begin to introduce solid foods to your baby at around 6 months of age.  Breast milk pumping Pumping and storing breast milk allows you to ensure that your baby is exclusively fed your breast milk, even at times when you are unable to breastfeed. This is especially important if you are going back to work while you are still breastfeeding or when you are not able to be present during feedings. Your lactation consultant can give you guidelines on how long it is safe to store breast milk. A breast pump is a machine that allows you to pump milk from your breast into a sterile bottle. The pumped breast milk can then be stored in a refrigerator or freezer. Some breast pumps are operated by hand, while others use electricity. Ask your lactation consultant which type will work best for you. Breast pumps can be purchased, but some hospitals and breastfeeding support groups lease breast pumps on a monthly basis. A lactation consultant can teach you how to hand express breast milk, if you prefer not to use a pump. Caring for your breasts while you breastfeed Nipples can become dry, cracked, and sore while breastfeeding. The following recommendations can help keep your breasts moisturized and healthy:  Avoid using soap on your nipples.  Wear a supportive bra. Although not required, special nursing bras and tank tops are designed to allow access to your breasts for breastfeeding without taking off your entire bra or top. Avoid wearing underwire-style bras or extremely tight bras.  Air dry your nipples for 3-4minutes after each feeding.  Use only cotton  bra pads to absorb leaked breast milk. Leaking of breast milk between feedings is normal.  Use lanolin on your nipples after breastfeeding. Lanolin helps to maintain your skin's normal moisture barrier. If you use pure lanolin, you do not need to wash it off before feeding your baby again. Pure lanolin is not toxic to your baby. You may also hand express a few drops of breast milk and gently massage that milk into your nipples and allow the milk to air dry.  In the first few weeks after giving birth, some women experience extremely full breasts (engorgement). Engorgement can make your   breasts feel heavy, warm, and tender to the touch. Engorgement peaks within 3-5 days after you give birth. The following recommendations can help ease engorgement:  Completely empty your breasts while breastfeeding or pumping. You may want to start by applying warm, moist heat (in the shower or with warm water-soaked hand towels) just before feeding or pumping. This increases circulation and helps the milk flow. If your baby does not completely empty your breasts while breastfeeding, pump any extra milk after he or she is finished.  Wear a snug bra (nursing or regular) or tank top for 1-2 days to signal your body to slightly decrease milk production.  Apply ice packs to your breasts, unless this is too uncomfortable for you.  Make sure that your baby is latched on and positioned properly while breastfeeding.  If engorgement persists after 48 hours of following these recommendations, contact your health care provider or a lactation consultant. Overall health care recommendations while breastfeeding  Eat healthy foods. Alternate between meals and snacks, eating 3 of each per day. Because what you eat affects your breast milk, some of the foods may make your baby more irritable than usual. Avoid eating these foods if you are sure that they are negatively affecting your baby.  Drink milk, fruit juice, and water to  satisfy your thirst (about 10 glasses a day).  Rest often, relax, and continue to take your prenatal vitamins to prevent fatigue, stress, and anemia.  Continue breast self-awareness checks.  Avoid chewing and smoking tobacco. Chemicals from cigarettes that pass into breast milk and exposure to secondhand smoke may harm your baby.  Avoid alcohol and drug use, including marijuana. Some medicines that may be harmful to your baby can pass through breast milk. It is important to ask your health care provider before taking any medicine, including all over-the-counter and prescription medicine as well as vitamin and herbal supplements. It is possible to become pregnant while breastfeeding. If birth control is desired, ask your health care provider about options that will be safe for your baby. Contact a health care provider if:  You feel like you want to stop breastfeeding or have become frustrated with breastfeeding.  You have painful breasts or nipples.  Your nipples are cracked or bleeding.  Your breasts are red, tender, or warm.  You have a swollen area on either breast.  You have a fever or chills.  You have nausea or vomiting.  You have drainage other than breast milk from your nipples.  Your breasts do not become full before feedings by the fifth day after you give birth.  You feel sad and depressed.  Your baby is too sleepy to eat well.  Your baby is having trouble sleeping.  Your baby is wetting less than 3 diapers in a 24-hour period.  Your baby has less than 3 stools in a 24-hour period.  Your baby's skin or the white part of his or her eyes becomes yellow.  Your baby is not gaining weight by 5 days of age. Get help right away if:  Your baby is overly tired (lethargic) and does not want to wake up and feed.  Your baby develops an unexplained fever. This information is not intended to replace advice given to you by your health care provider. Make sure you discuss  any questions you have with your health care provider. Document Released: 04/17/2005 Document Revised: 09/29/2015 Document Reviewed: 10/09/2012 Elsevier Interactive Patient Education  2017 [employer]  

## 2017-03-20 NOTE — Progress Notes (Signed)
   PRENATAL VISIT NOTE  Subjective:  Andrea Foster is a 26 y.o. G1P0000 at 8235w3d being seen today for ongoing prenatal care.  She is currently monitored for the following issues for this high-risk pregnancy and has Supervision of high risk pregnancy, antepartum; Polyhydramnios in third trimester, antepartum complication; and Late prenatal care affecting pregnancy on their problem list.  Patient reports no complaints.  Contractions: Not present. Vag. Bleeding: None.  Movement: Present. Denies leaking of fluid.   The following portions of the patient's history were reviewed and updated as appropriate: allergies, current medications, past family history, past medical history, past social history, past surgical history and problem list. Problem list updated.  Objective:   Vitals:   03/20/17 1116  BP: 107/71  Pulse: 78  Weight: 179 lb 8 oz (81.4 kg)    Fetal Status: Fetal Heart Rate (bpm): 147 Fundal Height: 35 cm Movement: Present     General:  Alert, oriented and cooperative. Patient is in no acute distress.  Skin: Skin is warm and dry. No rash noted.   Cardiovascular: Normal heart rate noted  Respiratory: Normal respiratory effort, no problems with respiration noted  Abdomen: Soft, gravid, appropriate for gestational age.  Pain/Pressure: Present     Pelvic: Cervical exam deferred        Extremities: Normal range of motion.  Edema: None  Mental Status:  Normal mood and affect. Normal behavior. Normal judgment and thought content.   Assessment and Plan:  Pregnancy: G1P0000 at 3135w3d  1. Supervision of high risk pregnancy, antepartum Continue prenatal care.  2. Late prenatal care affecting pregnancy in third trimester  Preterm labor symptoms and general obstetric precautions including but not limited to vaginal bleeding, contractions, leaking of fluid and fetal movement were reviewed in detail with the patient. Please refer to After Visit Summary for other counseling  recommendations.  Return in 2 weeks (on 04/03/2017).   Reva Boresanya S Saleem Coccia, MD

## 2017-03-27 ENCOUNTER — Ambulatory Visit (HOSPITAL_COMMUNITY): Payer: Federal, State, Local not specified - PPO

## 2017-03-28 ENCOUNTER — Encounter (HOSPITAL_COMMUNITY): Payer: Self-pay | Admitting: *Deleted

## 2017-03-28 ENCOUNTER — Other Ambulatory Visit: Payer: Self-pay

## 2017-03-28 ENCOUNTER — Inpatient Hospital Stay (HOSPITAL_COMMUNITY)
Admission: AD | Admit: 2017-03-28 | Discharge: 2017-03-29 | Disposition: A | Discharge status: Home / Self Care | Payer: Federal, State, Local not specified - PPO | Source: Ambulatory Visit | Attending: Obstetrics & Gynecology | Admitting: Obstetrics & Gynecology

## 2017-03-28 DIAGNOSIS — Z3A35 35 weeks gestation of pregnancy: Secondary | ICD-10-CM | POA: Insufficient documentation

## 2017-03-28 DIAGNOSIS — O26893 Other specified pregnancy related conditions, third trimester: Secondary | ICD-10-CM | POA: Insufficient documentation

## 2017-03-28 DIAGNOSIS — O9A213 Injury, poisoning and certain other consequences of external causes complicating pregnancy, third trimester: Secondary | ICD-10-CM

## 2017-03-28 DIAGNOSIS — O368130 Decreased fetal movements, third trimester, not applicable or unspecified: Secondary | ICD-10-CM | POA: Insufficient documentation

## 2017-03-28 DIAGNOSIS — W19XXXD Unspecified fall, subsequent encounter: Secondary | ICD-10-CM

## 2017-03-28 DIAGNOSIS — W010XXA Fall on same level from slipping, tripping and stumbling without subsequent striking against object, initial encounter: Secondary | ICD-10-CM | POA: Insufficient documentation

## 2017-03-28 DIAGNOSIS — Z79899 Other long term (current) drug therapy: Secondary | ICD-10-CM | POA: Insufficient documentation

## 2017-03-28 DIAGNOSIS — R109 Unspecified abdominal pain: Secondary | ICD-10-CM | POA: Insufficient documentation

## 2017-03-28 DIAGNOSIS — Y92009 Unspecified place in unspecified non-institutional (private) residence as the place of occurrence of the external cause: Secondary | ICD-10-CM

## 2017-03-28 NOTE — MAU Note (Signed)
Pt tripped over baby equipment and caught herself with her left arm. Since then,  she has had pain on her lower left side and left groin. She states not feeling the baby move as much since the fall. Denies any vag bleeding or leaking.

## 2017-03-29 ENCOUNTER — Inpatient Hospital Stay (HOSPITAL_COMMUNITY): Payer: Federal, State, Local not specified - PPO

## 2017-03-29 DIAGNOSIS — O9A213 Injury, poisoning and certain other consequences of external causes complicating pregnancy, third trimester: Secondary | ICD-10-CM | POA: Diagnosis not present

## 2017-03-29 DIAGNOSIS — O26893 Other specified pregnancy related conditions, third trimester: Secondary | ICD-10-CM | POA: Diagnosis not present

## 2017-03-29 DIAGNOSIS — R109 Unspecified abdominal pain: Secondary | ICD-10-CM | POA: Diagnosis not present

## 2017-03-29 DIAGNOSIS — Z79899 Other long term (current) drug therapy: Secondary | ICD-10-CM | POA: Diagnosis not present

## 2017-03-29 DIAGNOSIS — W010XXA Fall on same level from slipping, tripping and stumbling without subsequent striking against object, initial encounter: Secondary | ICD-10-CM | POA: Diagnosis not present

## 2017-03-29 DIAGNOSIS — O368130 Decreased fetal movements, third trimester, not applicable or unspecified: Secondary | ICD-10-CM | POA: Diagnosis not present

## 2017-03-29 DIAGNOSIS — Z3A35 35 weeks gestation of pregnancy: Secondary | ICD-10-CM

## 2017-03-29 MED ORDER — CYCLOBENZAPRINE HCL 10 MG PO TABS: 10.0000 mg | ORAL_TABLET | Freq: Two times a day (BID) | ORAL | 0 refills | Status: DC | PRN

## 2017-03-29 MED ORDER — LACTATED RINGERS IV BOLUS (SEPSIS)
1000.0000 mL | Freq: Once | INTRAVENOUS | Status: AC
  Administered 2017-03-29: 1000 mL via INTRAVENOUS

## 2017-03-29 NOTE — Discharge Instructions (Signed)
Preventing Injuries During Pregnancy °Injuries can happen during pregnancy. Minor falls and accidents usually do not harm you or your baby. But some injuries can harm you and your baby. Tell your doctor about any injury you suffer. °What can I do to avoid injuries? °Safety °· Remove rugs and loose objects on the floor. °· Wear comfortable shoes that have a good grip. Do not wear shoes that have high heels. °· Always wear your seat belt in the car. The lap belt should be below your belly. Always drive safely. °· Do not ride on a motorcycle. °Activity °· Do not take part in rough and violent activities or sports. °· Avoid: °? Walking on wet or slippery floors. °? Lifting heavy pots of boiling or hot liquids. °? Fixing electrical problems. °? Being near fires. °General instructions °· Take over-the-counter and prescription medicines only as told by your doctor. °· Know your blood type and the blood type of the baby's father. °· If you are a victim of domestic violence: °? Call your local emergency services (911 in the [place] °? Contact the National Domestic Violence Hotline for help and support. °Get help right away if: °· You fall on your belly or receive any serious blow to your belly. °· You have a stiff neck or neck pain after a fall or an injury. °· You get a headache or have problems with vision after an injury. °· You do not feel the baby move or the baby is not moving as much as normal. °· You have been a victim of domestic violence or any other kind of attack. °· You have been in a car accident. °· You have bleeding from your vagina. °· Fluid is leaking from your vagina. °· You start to have cramping or pain in your belly (contractions). °· You have very bad pain in your lower back. °· You feel weak or pass out (faint). °· You start to throw up (vomit) after an injury. °· You have been burned. °Summary °· Some injuries that happen during pregnancy can do harm to the baby. °· Tell your doctor about any  injury. °· Take steps to avoid injury. This includes removing rugs and loose objects on the floor. Always wear your seat belt in the car. °· Do not take part in rough and violent activities or sports. °· Get help right away if you have any serious accident or injury. °This information is not intended to replace advice given to you by your health care provider. Make sure you discuss any questions you have with your health care provider. °Document Released: 05/20/2010 Document Revised: 04/26/2016 Document Reviewed: 04/26/2016 °Elsevier Interactive Patient Education © 2017 [employer] °Fetal Movement Counts °Patient Name: ________________________________________________ Patient Due Date: ____________________ °What is a fetal movement count? °A fetal movement count is the number of times that you feel your baby move during a certain amount of time. This may also be called a fetal kick count. A fetal movement count is recommended for every pregnant woman. You may be asked to start counting fetal movements as early as week 28 of your pregnancy. °Pay attention to when your baby is most active. You may notice your baby's sleep and wake cycles. You may also notice things that make your baby move more. You should do a fetal movement count: °· When your baby is normally most active. °· At the same time each day. ° °A good time to count movements is while you are resting, after having something to eat   and drink. °How do I count fetal movements? °1. Find a quiet, comfortable area. Sit, or lie down on your side. °2. Write down the date, the start time and stop time, and the number of movements that you felt between those two times. Take this information with you to your health care visits. °3. For 2 hours, count kicks, flutters, swishes, rolls, and jabs. You should feel at least 10 movements during 2 hours. °4. You may stop counting after you have felt 10 movements. °5. If you do not feel 10 movements in 2 hours, have something  to eat and drink. Then, keep resting and counting for 1 hour. If you feel at least 4 movements during that hour, you may stop counting. °Contact a health care provider if: °· You feel fewer than 4 movements in 2 hours. °· Your baby is not moving like he or she usually does. °Date: ____________ Start time: ____________ Stop time: ____________ Movements: ____________ °Date: ____________ Start time: ____________ Stop time: ____________ Movements: ____________ °Date: ____________ Start time: ____________ Stop time: ____________ Movements: ____________ °Date: ____________ Start time: ____________ Stop time: ____________ Movements: ____________ °Date: ____________ Start time: ____________ Stop time: ____________ Movements: ____________ °Date: ____________ Start time: ____________ Stop time: ____________ Movements: ____________ °Date: ____________ Start time: ____________ Stop time: ____________ Movements: ____________ °Date: ____________ Start time: ____________ Stop time: ____________ Movements: ____________ °Date: ____________ Start time: ____________ Stop time: ____________ Movements: ____________ °This information is not intended to replace advice given to you by your health care provider. Make sure you discuss any questions you have with your health care provider. °Document Released: 05/17/2006 Document Revised: 12/15/2015 Document Reviewed: 05/27/2015 °Elsevier Interactive Patient Education © 2018 [employer] ° °

## 2017-03-29 NOTE — MAU Provider Note (Signed)
History     CSN: [id number]  Arrival date and time: 03/28/17 2326   First Provider Initiated Contact with Patient 03/28/17 2355     Chief Complaint  Patient presents with  . Fall  . Abdominal Pain   HPI Andrea Foster is a 26 y.o. G1P0000 at 6358w5d who presents with decreased fetal movement and abdominal pain after a fall tonight. She states she tripped over some furniture in her living room, fell backwards and hit the left side of her abdomen before she was able to catch herself. Since then, she states she has not felt the baby move. She also reports feeling sore in her left lower abdomen and left leg. She rates the pain a 5/10 and has not tried any medication for it. She also reports feeling tightening in her abdomen every 5-10 minutes since the fall. She denies any vaginal bleeding, leaking or abnormal discharge.  OB History    Gravida Para Term Preterm AB Living   1 0 0 0 0 0   SAB TAB Ectopic Multiple Live Births   0 0 0 0 0      Past Medical History:  Diagnosis Date  . Asthma    seasonal as a child  . Gonorrhea   . Hx of chlamydia infection 2013    Past Surgical History:  Procedure Laterality Date  . NO PAST SURGERIES      Family History  Problem Relation Age of Onset  . Cancer Maternal Grandfather        ? agent orange  . Asthma Neg Hx   . Diabetes Neg Hx   . Heart disease Neg Hx   . Hypertension Neg Hx   . Stroke Neg Hx     Social History   Tobacco Use  . Smoking status: Never Smoker  . Smokeless tobacco: Never Used  Substance Use Topics  . Alcohol use: No    Alcohol/week: 0.6 oz    Types: 1 Shots of liquor per week    Comment: rare, spicial occasions, not while preg  . Drug use: No    Allergies:  Allergies  Allergen Reactions  . Latex Hives    Medications Prior to Admission  Medication Sig Dispense Refill Last Dose  . prenatal vitamin w/FE, FA (NATACHEW) 29-1 MG CHEW chewable tablet Chew 1 tablet by mouth daily at 12 noon.   More than a  month at Unknown time    Review of Systems  Constitutional: Negative.  Negative for fatigue and fever.  HENT: Negative.   Respiratory: Negative.  Negative for shortness of breath.   Cardiovascular: Negative.  Negative for chest pain.  Gastrointestinal: Positive for abdominal pain. Negative for constipation, diarrhea, nausea and vomiting.  Genitourinary: Negative.  Negative for dysuria, vaginal bleeding and vaginal discharge.  Neurological: Negative.  Negative for dizziness and headaches.   Physical Exam   Blood pressure 114/78, pulse (!) 123, temperature 98.5 F (36.9 C), temperature source Oral, resp. rate 16, height 5\' 3"  (1.6 m), weight 179 lb (81.2 kg), last menstrual period 07/22/2016, SpO2 100 %.  Physical Exam  Nursing note and vitals reviewed. Constitutional: She is oriented to person, place, and time. She appears well-developed and well-nourished. No distress.  HENT:  Head: Normocephalic.  Eyes: Pupils are equal, round, and reactive to light.  Cardiovascular: Normal rate, regular rhythm and normal heart sounds.  Respiratory: Effort normal and breath sounds normal. No respiratory distress.  GI: Soft. Bowel sounds are normal. She exhibits no distension. There is  no tenderness.  Neurological: She is alert and oriented to person, place, and time.  Skin: Skin is warm and dry.  Psychiatric: She has a normal mood and affect. Her behavior is normal. Judgment and thought content normal.   Fetal Tracing:  Baseline: 140 Variability: moderate Accels: 15x15 Decels: none  Toco: 2-4  Dilation: 1 Effacement (%): Thick Cervical Position: Middle Station: -3 Presentation: Vertex Exam by:: Ma Hillock. Ezekeil Bethel CNM  MAU Course  Procedures  MDM NST 4 hours of monitoring IV bolus US MFM OB Limited- posterior placenta, no evidence of abruption or previa No cervical change over 2 hours and contractions resolved  Patient reports feeling FM since being in MAU Patient declines any pain  medication- patient does not like to take medication  Cat 1 FHR tracing throughout 4 hours of monitoring. No contractions after IV fluid bolus. Patient reports normal fetal movement and no pain.  This is the patient's 4th visit to MAU for a fall in this pregnancy. Patient denies any abuse and insists the falls are accidental.   Assessment and Plan   1. Decreased fetal movements in third trimester, single or unspecified fetus   2. Traumatic injury during pregnancy in third trimester   3. [redacted] weeks gestation of pregnancy   4. Fall as cause of accidental injury in home as place of occurrence, subsequent encounter    -Discharge home in stable condition -Rx for flexeril given to patient -Preterm labor precautions discussed -[facility] Patient may return to MAU as needed or if her condition were to change or worsen   Rolm BookbinderCaroline M Loukisha Gunnerson CNM 03/29/2017, 12:16 AM

## 2017-04-03 ENCOUNTER — Other Ambulatory Visit (HOSPITAL_COMMUNITY): Payer: Federal, State, Local not specified - PPO

## 2017-04-04 ENCOUNTER — Ambulatory Visit (INDEPENDENT_AMBULATORY_CARE_PROVIDER_SITE_OTHER): Payer: Federal, State, Local not specified - PPO | Admitting: Family Medicine

## 2017-04-04 VITALS — BP 108/74 | HR 104 | Wt 180.0 lb

## 2017-04-04 DIAGNOSIS — O0990 Supervision of high risk pregnancy, unspecified, unspecified trimester: Secondary | ICD-10-CM

## 2017-04-04 DIAGNOSIS — O403XX0 Polyhydramnios, third trimester, not applicable or unspecified: Secondary | ICD-10-CM

## 2017-04-04 LAB — OB RESULTS CONSOLE GBS: GBS: NEGATIVE

## 2017-04-04 NOTE — Progress Notes (Signed)
   PRENATAL VISIT NOTE  Subjective:  Andrea Foster is a 26 y.o. G1P0000 at 6016w4d being seen today for ongoing prenatal care.  She is currently monitored for the following issues for this high-risk pregnancy and has Supervision of high risk pregnancy, antepartum; Polyhydramnios in third trimester, antepartum complication; and Late prenatal care affecting pregnancy on their problem list.  Patient reports no complaints.  Contractions: Not present. Vag. Bleeding: None.  Movement: Present. Denies leaking of fluid.   The following portions of the patient's history were reviewed and updated as appropriate: allergies, current medications, past family history, past medical history, past social history, past surgical history and problem list. Problem list updated.  Objective:   Vitals:   04/04/17 1401  BP: 108/74  Pulse: (!) 104  Weight: 180 lb 0.6 oz (81.7 kg)    Fetal Status: Fetal Heart Rate (bpm): 164 Fundal Height: 38 cm Movement: Present  Presentation: Vertex  General:  Alert, oriented and cooperative. Patient is in no acute distress.  Skin: Skin is warm and dry. No rash noted.   Cardiovascular: Normal heart rate noted  Respiratory: Normal respiratory effort, no problems with respiration noted  Abdomen: Soft, gravid, appropriate for gestational age.  Pain/Pressure: Present     Pelvic: Cervical exam performed Dilation: 1.5 Effacement (%): 60 Station: -3  Extremities: Normal range of motion.  Edema: None  Mental Status:  Normal mood and affect. Normal behavior. Normal judgment and thought content.   Assessment and Plan:  Pregnancy: G1P0000 at 8916w4d  1. Supervision of high risk pregnancy, antepartum FHT and FH normal - Culture, beta strep (group b only)  2. Polyhydramnios in third trimester, antepartum complication No testing   Preterm labor symptoms and general obstetric precautions including but not limited to vaginal bleeding, contractions, leaking of fluid and fetal movement  were reviewed in detail with the patient. Please refer to After Visit Summary for other counseling recommendations.  Return in about 1 week (around 04/11/2017) for OB f/u.   Levie HeritageJacob J Ledora Delker, DO

## 2017-04-04 NOTE — Progress Notes (Signed)
Pt.states some Pressure below belly.

## 2017-04-08 LAB — CULTURE, BETA STREP (GROUP B ONLY): Strep Gp B Culture: NEGATIVE

## 2017-04-10 ENCOUNTER — Other Ambulatory Visit (HOSPITAL_COMMUNITY): Payer: Self-pay | Admitting: Obstetrics and Gynecology

## 2017-04-10 ENCOUNTER — Encounter (HOSPITAL_COMMUNITY): Payer: Self-pay

## 2017-04-10 ENCOUNTER — Other Ambulatory Visit (HOSPITAL_COMMUNITY): Payer: Federal, State, Local not specified - PPO

## 2017-04-10 ENCOUNTER — Ambulatory Visit (HOSPITAL_COMMUNITY)
Admission: RE | Admit: 2017-04-10 | Discharge: 2017-04-10 | Disposition: A | Discharge status: Home / Self Care | Payer: Federal, State, Local not specified - PPO | Source: Ambulatory Visit | Attending: Obstetrics & Gynecology | Admitting: Obstetrics & Gynecology

## 2017-04-10 DIAGNOSIS — O403XX0 Polyhydramnios, third trimester, not applicable or unspecified: Secondary | ICD-10-CM | POA: Diagnosis not present

## 2017-04-10 DIAGNOSIS — Z3A37 37 weeks gestation of pregnancy: Secondary | ICD-10-CM

## 2017-04-10 DIAGNOSIS — O409XX0 Polyhydramnios, unspecified trimester, not applicable or unspecified: Secondary | ICD-10-CM

## 2017-04-10 DIAGNOSIS — Z362 Encounter for other antenatal screening follow-up: Secondary | ICD-10-CM | POA: Diagnosis not present

## 2017-04-10 NOTE — Addendum Note (Signed)
Encounter addended by: Levonne HubertStalter, Mariachristina Holle M, RDMS, RVT on: 04/10/2017 4:26 PM  Actions taken: Imaging Exam ended

## 2017-04-12 ENCOUNTER — Telehealth: Payer: Self-pay

## 2017-04-12 NOTE — Telephone Encounter (Signed)
Pt requests results of GBS.  Results sent via MyChart to pt.

## 2017-04-16 ENCOUNTER — Ambulatory Visit (INDEPENDENT_AMBULATORY_CARE_PROVIDER_SITE_OTHER): Payer: Federal, State, Local not specified - PPO | Admitting: Obstetrics & Gynecology

## 2017-04-16 VITALS — BP 134/69 | HR 72 | Wt 182.0 lb

## 2017-04-16 DIAGNOSIS — O403XX0 Polyhydramnios, third trimester, not applicable or unspecified: Secondary | ICD-10-CM

## 2017-04-16 DIAGNOSIS — O0990 Supervision of high risk pregnancy, unspecified, unspecified trimester: Secondary | ICD-10-CM

## 2017-04-16 DIAGNOSIS — O0933 Supervision of pregnancy with insufficient antenatal care, third trimester: Secondary | ICD-10-CM

## 2017-04-16 NOTE — Progress Notes (Signed)
   PRENATAL VISIT NOTE  Subjective:  Andrea Foster is a 26 y.o. G1P0000 at 2318w2d being seen today for ongoing prenatal care.  She is currently monitored for the following issues for this high-risk pregnancy and has Supervision of high risk pregnancy, antepartum; Polyhydramnios in third trimester, antepartum complication; and Late prenatal care affecting pregnancy on their problem list.  Patient reports occasional contractions.  Contractions: Irritability. Vag. Bleeding: None.  Movement: Present. Denies leaking of fluid.   The following portions of the patient's history were reviewed and updated as appropriate: allergies, current medications, past family history, past medical history, past social history, past surgical history and problem list. Problem list updated.  Objective:   Vitals:   04/16/17 1518  BP: 134/69  Pulse: 72  Weight: 182 lb (82.6 kg)    Fetal Status: Fetal Heart Rate (bpm): 152   Movement: Present     General:  Alert, oriented and cooperative. Patient is in no acute distress.  Skin: Skin is warm and dry. No rash noted.   Cardiovascular: Normal heart rate noted  Respiratory: Normal respiratory effort, no problems with respiration noted  Abdomen: Soft, gravid, appropriate for gestational age.  Pain/Pressure: Present     Pelvic: Cervical exam deferred        Extremities: Normal range of motion.  Edema: None  Mental Status:  Normal mood and affect. Normal behavior. Normal judgment and thought content.   Assessment and Plan:  Pregnancy: G1P0000 at 2718w2d  1. Supervision of high risk pregnancy, antepartum  2. Polyhydramnios in third trimester, antepartum complication 04/10/2017 21.95  3. Late prenatal care affecting pregnancy in third trimester   Term labor symptoms and general obstetric precautions including but not limited to vaginal bleeding, contractions, leaking of fluid and fetal movement were reviewed in detail with the patient. Please refer to After Visit  Summary for other counseling recommendations.  Return in about 1 week (around 04/23/2017).  Willodean Rosenthalarolyn Harraway-Smith, MD

## 2017-04-17 ENCOUNTER — Ambulatory Visit (HOSPITAL_COMMUNITY): Payer: Federal, State, Local not specified - PPO

## 2017-04-25 ENCOUNTER — Other Ambulatory Visit: Payer: Self-pay | Admitting: Obstetrics and Gynecology

## 2017-04-25 ENCOUNTER — Ambulatory Visit (INDEPENDENT_AMBULATORY_CARE_PROVIDER_SITE_OTHER): Payer: Federal, State, Local not specified - PPO | Admitting: Obstetrics & Gynecology

## 2017-04-25 VITALS — BP 124/84 | HR 90 | Wt 187.2 lb

## 2017-04-25 DIAGNOSIS — O403XX0 Polyhydramnios, third trimester, not applicable or unspecified: Secondary | ICD-10-CM

## 2017-04-25 DIAGNOSIS — O0993 Supervision of high risk pregnancy, unspecified, third trimester: Secondary | ICD-10-CM

## 2017-04-25 DIAGNOSIS — O0990 Supervision of high risk pregnancy, unspecified, unspecified trimester: Secondary | ICD-10-CM

## 2017-04-25 LAB — POCT URINALYSIS DIP (DEVICE)
Bilirubin Urine: NEGATIVE
Glucose, UA: NEGATIVE mg/dL
Hgb urine dipstick: NEGATIVE
Ketones, ur: NEGATIVE mg/dL
Leukocytes, UA: NEGATIVE
Nitrite: NEGATIVE
Protein, ur: NEGATIVE mg/dL
Specific Gravity, Urine: 1.015 (ref 1.005–1.030)
Urobilinogen, UA: 0.2 mg/dL (ref 0.0–1.0)
pH: 7 (ref 5.0–8.0)

## 2017-04-25 NOTE — Progress Notes (Signed)
PRENATAL VISIT NOTE  Subjective:  Andrea Foster is a 26 y.o. G1P0000 at 628w4d being seen today for ongoing prenatal care.  She is currently monitored for the following issues for this high-risk pregnancy and has Supervision of high risk pregnancy, antepartum; Polyhydramnios in third trimester, antepartum complication; and Late prenatal care affecting pregnancy on their problem list.  Patient reports occasional contractions.  Contractions: Irregular. Vag. Bleeding: None.  Movement: Present. Denies leaking of fluid.   The following portions of the patient's history were reviewed and updated as appropriate: allergies, current medications, past family history, past medical history, past social history, past surgical history and problem list. Problem list updated.  Objective:   Vitals:   04/25/17 1353  BP: 124/84  Pulse: 90  Weight: 187 lb 3.2 oz (84.9 kg)    Fetal Status: Fetal Heart Rate (bpm): 140 Fundal Height: 42 cm Movement: Present  Presentation: Vertex  General:  Alert, oriented and cooperative. Patient is in no acute distress.  Skin: Skin is warm and dry. No rash noted.   Cardiovascular: Normal heart rate noted  Respiratory: Normal respiratory effort, no problems with respiration noted  Abdomen: Soft, gravid, appropriate for gestational age.  Pain/Pressure: Present     Pelvic: Cervical exam performed Dilation: 4 Effacement (%): 60 Station: -3  Extremities: Normal range of motion.  Edema: Trace  Mental Status:  Normal mood and affect. Normal behavior. Normal judgment and thought content.   [place] Mfm Ob Follow Up  Result Date: 04/10/2017 ----------------------------------------------------------------------  OBSTETRICS REPORT                      (Signed Final 04/10/2017 04:22 pm) ---------------------------------------------------------------------- Patient Info  ID #:       [id number]                          D.O.B.:  1990-07-06 (26 yrs)  Name:       Andrea Foster                  Visit Date: 04/10/2017 03:44 pm ---------------------------------------------------------------------- Performed By  Performed By:     Vivien Rotaachael H Small        Ref. Address:     [facility]                    [facility]                                                             [facility]                                                             [address]                                                             [address]  [place], [place]                                                             [address]  Attending:        Charlsie Merles MD         Secondary Phy.:   MAU Nursing-                                                             MAU/Triage  Referred By:      [facility]       Location:         [facility] for                    [facility]                    Healthcare ---------------------------------------------------------------------- Orders   #  Description                                 Code   1  [place] MFM OB FOLLOW UP                         [id number]  ----------------------------------------------------------------------   #  Ordered By               Order #        Accession #    Episode #   1  MARK Ezzard Standing              [id number]      [id number]     [id number]  ---------------------------------------------------------------------- Indications   [redacted] weeks gestation of pregnancy                Z3A.37   Polyhydramnios, third trimester, antepartum    O40.3XX0   condition or complication, unspecified fetus   Encounter for other antenatal screening        Z36.2   follow-up  ---------------------------------------------------------------------- OB History  Gravidity:    1         Term:   0        Prem:   0        SAB:   0  TOP:          0       Ectopic:  0        Living: 0 ---------------------------------------------------------------------- Fetal Evaluation  Num Of Fetuses:     1  Fetal Heart         151  Rate(bpm):   Cardiac Activity:   Observed  Presentation:       Cephalic  Placenta:           Posterior Fundal, above cervical os  P. Cord Insertion:  Previously Visualized  Amniotic Fluid  AFI FV:      Subjectively upper-normal  AFI Sum(cm)     %  Tile       Largest Pocket(cm)  21.95           86          9.06  RUQ(cm)       RLQ(cm)       LUQ(cm)        LLQ(cm)  3.72          9.06          5.48           3.69 ---------------------------------------------------------------------- Biometry  BPD:      95.9  mm     G. Age:  39w 1d         96  %    CI:        79.73   %    70 - 86                                                          FL/HC:      13.6   %    20.8 - 22.6  HC:      339.4  mm     G. Age:  39w 0d         65  %    HC/AC:      0.98        0.92 - 1.05  AC:      347.9  mm     G. Age:  38w 5d         91  %    FL/BPD:     48.0   %    71 - 87  FL:         46  mm     G. Age:  25w 2d        < 3  %    FL/AC:      13.2   %    20 - 24  HUM:        60  mm     G. Age:  34w 6d         23  %  Est. FW:    2742  gm      6 lb 1 oz     32  % ---------------------------------------------------------------------- Gestational Age  LMP:           37w 3d        Date:  07/22/16                 EDD:   04/28/17  U/S Today:     35w 4d                                        EDD:   05/11/17  Best:          37w 3d     Det. By:  LMP  (07/22/16)          EDD:   04/28/17 ---------------------------------------------------------------------- Anatomy  Cranium:               Appears normal         Aortic Arch:            Previously seen  Cavum:                 Previously seen        Ductal Arch:            Previously seen  Ventricles:            Appears normal         Diaphragm:              Previously seen  Choroid Plexus:        Previously seen        Stomach:                Appears normal, left                                                                        sided  Cerebellum:            Previously seen        Abdomen:                Previously seen   Posterior Fossa:       Previously seen        Abdominal Wall:         Previously seen  Nuchal Fold:           Not applicable (>20    Cord Vessels:           Previously seen                         wks GA)  Face:                  Orbits and profile     Kidneys:                Appear normal                         previously seen  Lips:                  Previously seen        Bladder:                Appears normal  Thoracic:              Appears normal         Spine:                  Previously seen  Heart:                 Appears normal         Upper Extremities:      Previously seen                         (4CH, axis, and situs  RVOT:                  Previously seen        Lower Extremities:      Previously seen  LVOT:  Appears normal  Other:  Heels previously visualized. 5th digit previously visualized. Nasal          bone previously visualized. Technically difficult due to fetal position. ---------------------------------------------------------------------- Cervix Uterus Adnexa  Cervix  Not visualized (advanced GA >29wks)  Uterus  No abnormality visualized.  Left Ovary  Not visualized.  Right Ovary  Not visualized.  Adnexa:       No abnormality visualized. No adnexal mass                visualized. ---------------------------------------------------------------------- Impression  Single living intrauterine pregnancy at 37+3 weeks with  borderline polyhydramnios.  Appropriate interval fetal growth in the 32nd percentile.  Normal amniotic fluid volume at the upper limit of normal with  AFI 22 cm  Normal interval fetal anatomy. ---------------------------------------------------------------------- Recommendations  Follow-up ultrasounds as clinically indicated. ----------------------------------------------------------------------                 Charlsie Merles, MD Electronically Signed Final Report   04/10/2017 04:22 pm ----------------------------------------------------------------------  [place] Mfm Ob  Limited  Result Date: 03/29/2017 ----------------------------------------------------------------------  OBSTETRICS REPORT                      (Signed Final 03/29/2017 08:31 am) ---------------------------------------------------------------------- Patient Info  ID #:       [id number]                          D.O.B.:  Aug 10, 1990 (26 yrs)  Name:       Andrea Foster                 Visit Date: 03/29/2017 12:26 am ---------------------------------------------------------------------- Performed By  Performed By:     Marcellina Millin          Ref. Address:     [facility]                                                             OB/Gyn Clinic                                                             [address]                                                             [place], [place]  [address]  Attending:        Particia Nearing MD       Secondary Phy.:   MAU Nursing-                                                             MAU/Triage  Referred By:      [facility]       Location:         [facility] for                    [facility]                    Healthcare ---------------------------------------------------------------------- Orders   #  Description                                 Code   1  [place] MFM OB LIMITED                           60454.09  ----------------------------------------------------------------------   #  Ordered By               Order #        Accession #    Episode #   1  Cleone Slim           [id number]      [id number]     [id number]  ---------------------------------------------------------------------- Indications   [redacted] weeks gestation of pregnancy                Z3A.35   Traumatic injury during pregnancy (fall)       O9A.219 T14.90  ---------------------------------------------------------------------- OB  History  Gravidity:    1         Term:   0        Prem:   0        SAB:   0  TOP:          0       Ectopic:  0        Living: 0 ---------------------------------------------------------------------- Fetal Evaluation  Num Of Fetuses:     1  Fetal Heart         157  Rate(bpm):  Cardiac Activity:   Observed  Presentation:       Cephalic  Placenta:           Posterior Fundal, above cervical os  P. Cord Insertion:  Visualized  Amniotic Fluid  AFI FV:      Subjectively within normal limits  AFI Sum(cm)     %Tile       Largest Pocket(cm)  17.59           65          6.13  RUQ(cm)       RLQ(cm)       LUQ(cm)        LLQ(cm)  6.13          4.87          3.93  2.66  Comment:    No placental abruption or previa identified. ---------------------------------------------------------------------- Gestational Age  LMP:           35w 5d        Date:  07/22/16                 EDD:   04/28/17  Best:          35w 5d     Det. By:  LMP  (07/22/16)          EDD:   04/28/17 ---------------------------------------------------------------------- Anatomy  Stomach:               Appears normal, left   Bladder:                Appears normal                         sided ---------------------------------------------------------------------- Cervix Uterus Adnexa  Cervix  Not visualized (advanced GA >29wks)  Adnexa:       No abnormality visualized. ---------------------------------------------------------------------- Impression  SIUP at 35+5 weeks  Normal amniotic fluid volume  Posterior/fundal placenta; no previa; no subchorionic fluid  collections/hemorrhage identified ---------------------------------------------------------------------- Recommendations  Follow-up as clinically indicated ----------------------------------------------------------------------                 Particia NearingMartha Decker, MD Electronically Signed Final Report   03/29/2017 08:31 am ----------------------------------------------------------------------   Assessment and  Plan:  Pregnancy: G1P0000 at 7162w4d  1. Polyhydramnios in third trimester, antepartum complication Still had mild polyhydramnios with SDP of 9 cm at 37 weeks, still has elevated fundal height. IOL scheduled at 40 weeks, 04/28/17 at 0800. Patient has a very favorable cervix, may go into spontaneous labor before then.   2. Supervision of high risk pregnancy, antepartum Term labor symptoms and general obstetric precautions including but not limited to vaginal bleeding, contractions, leaking of fluid and fetal movement were reviewed in detail with the patient. Please refer to After Visit Summary for other counseling recommendations.  Return in about 5 weeks (around 05/30/2017) for Postpartum check.   Jaynie CollinsUgonna Najmah Carradine, MD

## 2017-04-25 NOTE — Progress Notes (Signed)
Scheduled for IOL 04/28/17 0800.

## 2017-04-25 NOTE — Patient Instructions (Signed)
Return to clinic for any scheduled appointments or obstetric concerns, or go to MAU for evaluation  

## 2017-04-26 ENCOUNTER — Telehealth (HOSPITAL_COMMUNITY): Payer: Self-pay | Admitting: *Deleted

## 2017-04-26 ENCOUNTER — Encounter (HOSPITAL_COMMUNITY): Payer: Self-pay

## 2017-04-26 ENCOUNTER — Inpatient Hospital Stay (HOSPITAL_COMMUNITY)
Admission: AD | Admit: 2017-04-26 | Discharge: 2017-04-28 | DRG: 807 | Disposition: A | Discharge status: Home / Self Care | Payer: Federal, State, Local not specified - PPO | Source: Ambulatory Visit | Attending: Family Medicine | Admitting: Family Medicine

## 2017-04-26 ENCOUNTER — Inpatient Hospital Stay (HOSPITAL_COMMUNITY): Payer: Federal, State, Local not specified - PPO | Admitting: Anesthesiology

## 2017-04-26 DIAGNOSIS — O9902 Anemia complicating childbirth: Secondary | ICD-10-CM | POA: Diagnosis present

## 2017-04-26 DIAGNOSIS — O139 Gestational [pregnancy-induced] hypertension without significant proteinuria, unspecified trimester: Secondary | ICD-10-CM

## 2017-04-26 DIAGNOSIS — O0933 Supervision of pregnancy with insufficient antenatal care, third trimester: Secondary | ICD-10-CM | POA: Diagnosis not present

## 2017-04-26 DIAGNOSIS — D649 Anemia, unspecified: Secondary | ICD-10-CM | POA: Diagnosis present

## 2017-04-26 DIAGNOSIS — O403XX0 Polyhydramnios, third trimester, not applicable or unspecified: Secondary | ICD-10-CM | POA: Diagnosis not present

## 2017-04-26 DIAGNOSIS — O134 Gestational [pregnancy-induced] hypertension without significant proteinuria, complicating childbirth: Secondary | ICD-10-CM | POA: Diagnosis not present

## 2017-04-26 DIAGNOSIS — Z3A39 39 weeks gestation of pregnancy: Secondary | ICD-10-CM | POA: Diagnosis not present

## 2017-04-26 DIAGNOSIS — Z3483 Encounter for supervision of other normal pregnancy, third trimester: Secondary | ICD-10-CM | POA: Diagnosis present

## 2017-04-26 DIAGNOSIS — O0930 Supervision of pregnancy with insufficient antenatal care, unspecified trimester: Secondary | ICD-10-CM

## 2017-04-26 LAB — COMPREHENSIVE METABOLIC PANEL
ALT: 20 U/L (ref 14–54)
AST: 26 U/L (ref 15–41)
Albumin: 2.9 g/dL — ABNORMAL LOW (ref 3.5–5.0)
Alkaline Phosphatase: 189 U/L — ABNORMAL HIGH (ref 38–126)
Anion gap: 9 (ref 5–15)
BUN: 7 mg/dL (ref 6–20)
CO2: 21 mmol/L — ABNORMAL LOW (ref 22–32)
Calcium: 8.9 mg/dL (ref 8.9–10.3)
Chloride: 106 mmol/L (ref 101–111)
Creatinine, Ser: 0.65 mg/dL (ref 0.44–1.00)
GFR calc Af Amer: >60 mL/min (ref >60)
GFR calc non Af Amer: >60 mL/min (ref >60)
Glucose, Bld: 98 mg/dL (ref 65–99)
Potassium: 4.7 mmol/L (ref 3.5–5.1)
Sodium: 136 mmol/L (ref 135–145)
Total Bilirubin: 0.4 mg/dL (ref 0.3–1.2)
Total Protein: 6.5 g/dL (ref 6.5–8.1)

## 2017-04-26 LAB — TYPE AND SCREEN
ABO/RH(D): O POS
Antibody Screen: NEGATIVE

## 2017-04-26 LAB — PROTEIN / CREATININE RATIO, URINE
Creatinine, Urine: 54 mg/dL
Protein Creatinine Ratio: 0.26 mg/mg{Cre} — ABNORMAL HIGH (ref 0.00–0.15)
Total Protein, Urine: 14 mg/dL

## 2017-04-26 LAB — CBC
HCT: 30.9 % — ABNORMAL LOW (ref 36.0–46.0)
Hemoglobin: 9.6 g/dL — ABNORMAL LOW (ref 12.0–15.0)
MCH: 26.8 pg (ref 26.0–34.0)
MCHC: 31.1 g/dL (ref 30.0–36.0)
MCV: 86.3 fL (ref 78.0–100.0)
Platelets: 282 10*3/uL (ref 150–400)
RBC: 3.58 MIL/uL — ABNORMAL LOW (ref 3.87–5.11)
RDW: 14.4 % (ref 11.5–15.5)
WBC: 10.5 10*3/uL (ref 4.0–10.5)

## 2017-04-26 LAB — RPR: RPR Ser Ql: NONREACTIVE

## 2017-04-26 MED ORDER — SIMETHICONE 80 MG PO CHEW: 80.0000 mg | CHEWABLE_TABLET | ORAL | Status: DC | PRN

## 2017-04-26 MED ORDER — OXYCODONE-ACETAMINOPHEN 5-325 MG PO TABS: 2.0000 | ORAL_TABLET | ORAL | Status: DC | PRN

## 2017-04-26 MED ORDER — DIPHENHYDRAMINE HCL 25 MG PO CAPS: 25.0000 mg | ORAL_CAPSULE | Freq: Four times a day (QID) | ORAL | Status: DC | PRN

## 2017-04-26 MED ORDER — PHENYLEPHRINE 40 MCG/ML (10ML) SYRINGE FOR IV PUSH (FOR BLOOD PRESSURE SUPPORT)
80.0000 ug | PREFILLED_SYRINGE | INTRAVENOUS | Status: DC | PRN
  Filled 2017-04-26: qty 10
  Filled 2017-04-26: qty 5
  Filled 2017-04-26: qty 10

## 2017-04-26 MED ORDER — IBUPROFEN 600 MG PO TABS
600.0000 mg | ORAL_TABLET | Freq: Four times a day (QID) | ORAL | Status: DC
  Administered 2017-04-26 – 2017-04-28 (×7): 600 mg via ORAL
  Filled 2017-04-26 (×7): qty 1

## 2017-04-26 MED ORDER — LACTATED RINGERS IV SOLN
500.0000 mL | INTRAVENOUS | Status: DC | PRN
  Administered 2017-04-26: 1000 mL via INTRAVENOUS

## 2017-04-26 MED ORDER — HYDROXYZINE HCL 50 MG PO TABS
50.0000 mg | ORAL_TABLET | Freq: Four times a day (QID) | ORAL | Status: DC | PRN
  Filled 2017-04-26: qty 1

## 2017-04-26 MED ORDER — ZOLPIDEM TARTRATE 5 MG PO TABS: 5.0000 mg | ORAL_TABLET | Freq: Every evening | ORAL | Status: DC | PRN

## 2017-04-26 MED ORDER — SENNOSIDES-DOCUSATE SODIUM 8.6-50 MG PO TABS
2.0000 | ORAL_TABLET | ORAL | Status: DC
  Administered 2017-04-26 – 2017-04-28 (×2): 2 via ORAL
  Filled 2017-04-26 (×2): qty 2

## 2017-04-26 MED ORDER — PRENATAL MULTIVITAMIN CH
1.0000 | ORAL_TABLET | Freq: Every day | ORAL | Status: DC
  Administered 2017-04-27: 1 via ORAL
  Filled 2017-04-26: qty 1

## 2017-04-26 MED ORDER — ONDANSETRON HCL 4 MG PO TABS: 4.0000 mg | ORAL_TABLET | ORAL | Status: DC | PRN

## 2017-04-26 MED ORDER — LIDOCAINE HCL (PF) 1 % IJ SOLN
INTRAMUSCULAR | Status: DC | PRN
  Administered 2017-04-26: 6 mL via EPIDURAL
  Administered 2017-04-26: 8 mL via EPIDURAL
  Administered 2017-04-26: 4 mL via EPIDURAL
  Administered 2017-04-26: 4 mL

## 2017-04-26 MED ORDER — FENTANYL 2.5 MCG/ML BUPIVACAINE 1/10 % EPIDURAL INFUSION (WH - ANES)
14.0000 mL/h | INTRAMUSCULAR | Status: DC | PRN
  Administered 2017-04-26 (×3): 14 mL/h via EPIDURAL
  Filled 2017-04-26 (×2): qty 100

## 2017-04-26 MED ORDER — SOD CITRATE-CITRIC ACID 500-334 MG/5ML PO SOLN: 30.0000 mL | ORAL | Status: DC | PRN

## 2017-04-26 MED ORDER — ACETAMINOPHEN 325 MG PO TABS: 650.0000 mg | ORAL_TABLET | ORAL | Status: DC | PRN

## 2017-04-26 MED ORDER — EPHEDRINE 5 MG/ML INJ
10.0000 mg | INTRAVENOUS | Status: DC | PRN
  Filled 2017-04-26: qty 2

## 2017-04-26 MED ORDER — LACTATED RINGERS IV SOLN
500.0000 mL | Freq: Once | INTRAVENOUS | Status: AC
  Administered 2017-04-26: 500 mL via INTRAVENOUS

## 2017-04-26 MED ORDER — OXYTOCIN 40 UNITS IN LACTATED RINGERS INFUSION - SIMPLE MED
2.5000 [IU]/h | INTRAVENOUS | Status: DC
  Administered 2017-04-26: 2.5 [IU]/h via INTRAVENOUS

## 2017-04-26 MED ORDER — BENZOCAINE-MENTHOL 20-0.5 % EX AERO: 1.0000 "application " | INHALATION_SPRAY | CUTANEOUS | Status: DC | PRN

## 2017-04-26 MED ORDER — TETANUS-DIPHTH-ACELL PERTUSSIS 5-2.5-18.5 LF-MCG/0.5 IM SUSP: 0.5000 mL | Freq: Once | INTRAMUSCULAR | Status: DC

## 2017-04-26 MED ORDER — WITCH HAZEL-GLYCERIN EX PADS: 1.0000 "application " | MEDICATED_PAD | CUTANEOUS | Status: DC | PRN

## 2017-04-26 MED ORDER — LACTATED RINGERS IV SOLN
INTRAVENOUS | Status: DC
  Administered 2017-04-26: 12:00:00 via INTRAVENOUS

## 2017-04-26 MED ORDER — OXYTOCIN BOLUS FROM INFUSION
500.0000 mL | Freq: Once | INTRAVENOUS | Status: AC
  Administered 2017-04-26: 500 mL via INTRAVENOUS

## 2017-04-26 MED ORDER — ONDANSETRON HCL 4 MG/2ML IJ SOLN: 4.0000 mg | Freq: Four times a day (QID) | INTRAMUSCULAR | Status: DC | PRN

## 2017-04-26 MED ORDER — TERBUTALINE SULFATE 1 MG/ML IJ SOLN
0.2500 mg | Freq: Once | INTRAMUSCULAR | Status: DC | PRN
  Filled 2017-04-26: qty 1

## 2017-04-26 MED ORDER — COCONUT OIL OIL: 1.0000 "application " | TOPICAL_OIL | Status: DC | PRN

## 2017-04-26 MED ORDER — VITAMIN K1 1 MG/0.5ML IJ SOLN
INTRAMUSCULAR | Status: AC
  Filled 2017-04-26: qty 0.5

## 2017-04-26 MED ORDER — PHENYLEPHRINE 40 MCG/ML (10ML) SYRINGE FOR IV PUSH (FOR BLOOD PRESSURE SUPPORT)
80.0000 ug | PREFILLED_SYRINGE | INTRAVENOUS | Status: DC | PRN
  Filled 2017-04-26: qty 5

## 2017-04-26 MED ORDER — ONDANSETRON HCL 4 MG/2ML IJ SOLN: 4.0000 mg | INTRAMUSCULAR | Status: DC | PRN

## 2017-04-26 MED ORDER — DIPHENHYDRAMINE HCL 50 MG/ML IJ SOLN: 12.5000 mg | INTRAMUSCULAR | Status: DC | PRN

## 2017-04-26 MED ORDER — FLEET ENEMA 7-19 GM/118ML RE ENEM: 1.0000 | ENEMA | Freq: Every day | RECTAL | Status: DC | PRN

## 2017-04-26 MED ORDER — OXYCODONE-ACETAMINOPHEN 5-325 MG PO TABS: 1.0000 | ORAL_TABLET | ORAL | Status: DC | PRN

## 2017-04-26 MED ORDER — FENTANYL CITRATE (PF) 100 MCG/2ML IJ SOLN
50.0000 ug | INTRAMUSCULAR | Status: DC | PRN
  Administered 2017-04-26 (×2): 100 ug via INTRAVENOUS
  Filled 2017-04-26 (×2): qty 2

## 2017-04-26 MED ORDER — LIDOCAINE HCL (PF) 1 % IJ SOLN
30.0000 mL | INTRAMUSCULAR | Status: DC | PRN
  Filled 2017-04-26: qty 30

## 2017-04-26 MED ORDER — OXYTOCIN 40 UNITS IN LACTATED RINGERS INFUSION - SIMPLE MED
1.0000 m[IU]/min | INTRAVENOUS | Status: DC
  Administered 2017-04-26: 2 m[IU]/min via INTRAVENOUS
  Filled 2017-04-26: qty 1000

## 2017-04-26 MED ORDER — DIBUCAINE 1 % RE OINT: 1.0000 "application " | TOPICAL_OINTMENT | RECTAL | Status: DC | PRN

## 2017-04-26 NOTE — Anesthesia Preprocedure Evaluation (Signed)
Anesthesia Evaluation  Patient identified by MRN, date of birth, ID band Patient awake    Reviewed: Allergy & Precautions, NPO status , Patient's Chart, lab work & pertinent test results  Airway Mallampati: II  TM Distance: >3 FB Neck ROM: Full    Dental  (+) Dental Advisory Given   Pulmonary neg pulmonary ROS,  Seasonal asthma as a child   Pulmonary exam normal breath sounds clear to auscultation       Cardiovascular negative cardio ROS Normal cardiovascular exam Rhythm:Regular Rate:Normal     Neuro/Psych negative neurological ROS  negative psych ROS   GI/Hepatic negative GI ROS, Neg liver ROS,   Endo/Other  negative endocrine ROS  Renal/GU negative Renal ROS  negative genitourinary   Musculoskeletal negative musculoskeletal ROS (+)   Abdominal   Peds  Hematology  (+) anemia ,   Anesthesia Other Findings   Reproductive/Obstetrics (+) Pregnancy                             Anesthesia Physical Anesthesia Plan  ASA: II  Anesthesia Plan: Epidural   Post-op Pain Management:    Induction:   PONV Risk Score and Plan:   Airway Management Planned: Natural Airway  Additional Equipment:   Intra-op Plan:   Post-operative Plan:   Informed Consent: I have reviewed the patients History and Physical, chart, labs and discussed the procedure including the risks, benefits and alternatives for the proposed anesthesia with the patient or authorized representative who has indicated his/her understanding and acceptance.     Plan Discussed with:   Anesthesia Plan Comments: (Labs reviewed. Platelets acceptable, patient not taking any blood thinning medications. Risks and benefits discussed with patient, patient expressed understanding and wished to proceed.)        Anesthesia Quick Evaluation

## 2017-04-26 NOTE — Anesthesia Procedure Notes (Signed)
Epidural Patient location during procedure: OB Start time: 04/26/2017 6:45 AM End time: 04/26/2017 6:50 AM  Staffing Anesthesiologist: Beryle LatheBrock, Thomas E, MD Performed: anesthesiologist   Preanesthetic Checklist Completed: patient identified, pre-op evaluation, timeout performed, IV checked, risks and benefits discussed and monitors and equipment checked  Epidural Patient position: sitting Prep: DuraPrep Patient monitoring: continuous pulse ox and blood pressure Approach: midline Location: L2-L3 Injection technique: LOR saline  Needle:  Needle type: Tuohy  Needle gauge: 17 G Needle length: 9 cm Needle insertion depth: 6 cm Catheter size: 19 Gauge Catheter at skin depth: 11 cm Test dose: negative and Other (1% lidocaine)  Additional Notes Patient identified. Risks including, but not limited to, bleeding, infection, nerve damage, paralysis, inadequate analgesia, blood pressure changes, nausea, vomiting, allergic reaction, postpartum back pain, itching, and headache were discussed. Patient expressed understanding and wished to proceed. Sterile prep and drape, including hand hygiene, mask, and sterile gloves were used. The patient was positioned and the spine was prepped. The skin was anesthetized with lidocaine. No paraesthesia or other complication noted. The patient did not experience any signs of intravascular injection such as tinnitus or metallic taste in mouth, nor signs of intrathecal spread such as rapid motor block. Please see nursing notes for vital signs. The patient tolerated the procedure well.   Leslye Peerhomas Brock, MDReason for block:procedure for pain

## 2017-04-26 NOTE — H&P (Signed)
Obstetric History and Physical  Andrea Foster is a 26 y.o. G1P0000 with IUP at [redacted]w[redacted]d presenting for SOL. Patient states she has been having  Regular contractions, none vaginal bleeding, intact membranes, with active fetal movement.    Patient had elevated BPs on admission. No blurry vision, headaches or peripheral edema, and RUQ pain.   Prenatal Course Source of Care: [facility] Dating: By LMP --->  Estimated Date of Delivery: 04/28/17 Pregnancy complications or risks: Patient Active Problem List   Diagnosis Date Noted  . Late prenatal care affecting pregnancy 01/31/2017  . Supervision of high risk pregnancy, antepartum 01/15/2017  . Polyhydramnios in third trimester, antepartum complication 01/15/2017   She plans to breastfeed She is undecided for postpartum contraception.   Sono:    @[redacted]w[redacted]d , CWD, normal anatomy, cephalic presentation, posterior placenta, 2742g, 32% EFW, borderline polyhydramnios - AFI 22  Prenatal labs and studies: ABO, Rh: --/--/O POS (09/19 1940) Antibody: NEG (09/19 1940) Rubella: Nonimmune (07/02 0000) RPR: Nonreactive (07/02 0000)  HBsAg: Negative (07/02 0000)  HIV:   Negative GBS: Negative 1 hr Glucola  84 Genetic screening normal Anatomy US normal; polyhydraminos  Prenatal Transfer Tool  Maternal Diabetes: No Genetic Screening: Normal Maternal Ultrasounds/Referrals: Abnormal:  Findings:   Other: Polyhydraminos Fetal Ultrasounds or other Referrals:  None Maternal Substance Abuse:  No Significant Maternal Medications:  None Significant Maternal Lab Results: None  Past Medical History:  Diagnosis Date  . Asthma    seasonal as a child  . Gonorrhea   . Hx of chlamydia infection 2013    Past Surgical History:  Procedure Laterality Date  . NO PAST SURGERIES      OB History  Gravida Para Term Preterm AB Living  1 0 0 0 0 0  SAB TAB Ectopic Multiple Live Births  0 0 0 0 0    # Outcome Date GA Lbr Len/2nd Weight Sex Delivery Anes PTL Lv  1  Current               Social History   Socioeconomic History  . Marital status: Single    Spouse name: None  . Number of children: None  . Years of education: None  . Highest education level: None  Social Needs  . Financial resource strain: None  . Food insecurity - worry: None  . Food insecurity - inability: None  . Transportation needs - medical: None  . Transportation needs - non-medical: None  Occupational History  . None  Tobacco Use  . Smoking status: Never Smoker  . Smokeless tobacco: Never Used  Substance and Sexual Activity  . Alcohol use: No    Alcohol/week: 0.6 oz    Types: 1 Shots of liquor per week    Comment: rare, spicial occasions, not while preg  . Drug use: No  . Sexual activity: Yes    Birth control/protection: None  Other Topics Concern  . None  Social History Narrative  . None    Family History  Problem Relation Age of Onset  . Cancer Maternal Grandfather        ? agent orange  . Asthma Neg Hx   . Diabetes Neg Hx   . Heart disease Neg Hx   . Hypertension Neg Hx   . Stroke Neg Hx     Medications Prior to Admission  Medication Sig Dispense Refill Last Dose  . cyclobenzaprine (FLEXERIL) 10 MG tablet Take 1 tablet (10 mg total) by mouth 2 (two) times daily as needed for muscle spasms. (  Patient not taking: Reported on 04/25/2017) 5 tablet 0 Not Taking  . prenatal vitamin w/FE, FA (NATACHEW) 29-1 MG CHEW chewable tablet Chew 1 tablet by mouth daily at 12 noon.   Taking    Allergies  Allergen Reactions  . Latex Hives    Review of Systems: Negative except for what is mentioned in HPI.  Physical Exam: BP (!) 143/88   Pulse 66   Temp 98.3 F (36.8 C) (Oral)   Resp 20   LMP 07/22/2016   SpO2 100%  CONSTITUTIONAL: Well-developed, well-nourished female in no acute distress.  HENT:  Normocephalic, atraumatic, External right and left ear normal. Oropharynx is clear and moist EYES: Conjunctivae and EOM are normal. Pupils are equal, round,  and reactive to light. No scleral icterus.  NECK: Normal range of motion, supple, no masses SKIN: Skin is warm and dry. No rash noted. Not diaphoretic. No erythema. No pallor. NEUROLOGIC: Alert and oriented to person, place, and time. Normal reflexes, muscle tone coordination. No cranial nerve deficit noted. PSYCHIATRIC: Normal mood and affect. Normal behavior. Normal judgment and thought content. CARDIOVASCULAR: Normal heart rate noted, regular rhythm RESPIRATORY: Effort and breath sounds normal, no problems with respiration noted ABDOMEN: Soft, nontender, nondistended, gravid. MUSCULOSKELETAL: Normal range of motion. No edema and no tenderness. 2+ distal pulses.  Cervical Exam: Dilation: 5 Effacement (%): 100 Cervical Position: Middle Station: -1 Presentation: Vertex Exam by:: Dr.J. Phelps  Presentation: cephalic FHT:  Baseline rate 150 bpm   Variability moderate  Accelerations present   Decelerations none Contractions: Every 2-4 mins   Pertinent Labs/Studies:   Results for orders placed or performed during the hospital encounter of 04/26/17 (from the past 24 hour(s))  Protein / creatinine ratio, urine     Status: Abnormal   Collection Time: 04/26/17 12:38 AM  Result Value Ref Range   Creatinine, Urine 54.00 mg/dL   Total Protein, Urine 14 mg/dL   Protein Creatinine Ratio 0.26 (H) 0.00 - 0.15 mg/mg[Cre]  CBC     Status: Abnormal   Collection Time: 04/26/17  1:17 AM  Result Value Ref Range   WBC 10.5 4.0 - 10.5 K/uL   RBC 3.58 (L) 3.87 - 5.11 MIL/uL   Hemoglobin 9.6 (L) 12.0 - 15.0 g/dL   HCT 16.130.9 (L) 09.636.0 - 04.546.0 %   MCV 86.3 78.0 - 100.0 fL   MCH 26.8 26.0 - 34.0 pg   MCHC 31.1 30.0 - 36.0 g/dL   RDW 40.914.4 81.111.5 - 91.415.5 %   Platelets 282 150 - 400 K/uL  Comprehensive metabolic panel     Status: Abnormal   Collection Time: 04/26/17  1:17 AM  Result Value Ref Range   Sodium 136 135 - 145 mmol/L   Potassium 4.7 3.5 - 5.1 mmol/L   Chloride 106 101 - 111 mmol/L   CO2 21 (L)  22 - 32 mmol/L   Glucose, Bld 98 65 - 99 mg/dL   BUN 7 6 - 20 mg/dL   Creatinine, Ser 7.820.65 0.44 - 1.00 mg/dL   Calcium 8.9 8.9 - 95.610.3 mg/dL   Total Protein 6.5 6.5 - 8.1 g/dL   Albumin 2.9 (L) 3.5 - 5.0 g/dL   AST 26 15 - 41 U/L   ALT 20 14 - 54 U/L   Alkaline Phosphatase 189 (H) 38 - 126 U/L   Total Bilirubin 0.4 0.3 - 1.2 mg/dL   GFR calc non Af Amer >60 >60 mL/min   GFR calc Af Amer >60 >60 mL/min   Anion  gap 9 5 - 15    Assessment : Earl GalaJocelyn Kosmicki is a 26 y.o. G1P0000 at 2777w5d being admitted for labor. Elevated BPs. Not yet meeting criteria for gHTN.   Plan: Labor: Expectant management. Augmentation as needed. Analgesia as needed; requesting IV pain medicines Elevated BPs. Continue to monitor. PIH labs wnl; no symptoms. FWB: Reassuring fetal heart tracing.   GBS negative Delivery plan: Hopeful for vaginal delivery   Caryl AdaJazma Phelps, DO OB Fellow Faculty Practice, [facility] [place] 04/26/2017, 2:23 AM

## 2017-04-26 NOTE — MAU Note (Signed)
Pt reports contractions that started at 8am this morning and stopped around noon. States contractions started around 8pm and now every 5 mins. States cervix was 4cm today at office. Pt denies LOF or vaginal bleeding. Reports good fetal movement.

## 2017-04-26 NOTE — Anesthesia Procedure Notes (Signed)
Epidural Patient location during procedure: OB Start time: 04/26/2017 8:53 AM  Staffing Anesthesiologist: Cristela BlueJackson, Minami Arriaga, MD  Preanesthetic Checklist Completed: patient identified, pre-op evaluation, timeout performed, IV checked, risks and benefits discussed and monitors and equipment checked  Epidural Patient position: sitting Prep: DuraPrep Patient monitoring: blood pressure and continuous pulse ox Approach: right paramedian Location: L3-L4 Injection technique: LOR air  Needle:  Needle type: Tuohy  Needle gauge: 17 G Needle insertion depth: 5 cm Catheter type: closed end flexible Catheter size: 19 Gauge Catheter at skin depth: 10 cm Test dose: negative  Assessment Sensory level: T8  Additional Notes   Dosing of Epidural:  1st dose, through catheter ............................................Marland Kitchen.  Xylocaine 40 mg  2nd dose, through catheter, after waiting 3 minutes........Marland Kitchen.Xylocaine 60 mg    As each dose occurred, patient was free of IV sx; and patient exhibited no evidence of SA injection.  Patient is more comfortable after epidural dosed. Please see RN's note for documentation of vital signs,and FHR which are stable.  Patient reminded not to try to ambulate with numb legs, and that an RN must be present when she attempts to get up.

## 2017-04-26 NOTE — Anesthesia Pain Management Evaluation Note (Signed)
  CRNA Pain Management Visit Note  Patient: Andrea Foster, 26 y.o., female  "Hello I am a member of the anesthesia team [facility]. We have an anesthesia team available at all times to provide care throughout the hospital, including epidural management and anesthesia for C-section. I don't know your plan for the delivery whether it a natural birth, water birth, IV sedation, nitrous supplementation, doula or epidural, but we want to meet your pain goals."   1.Was your pain managed to your expectations on prior hospitalizations?   Yes   2.What is your expectation for pain management during this hospitalization?     Epidural  3.How can we help you reach that goal? epidural  Record the patient's initial score and the patient's pain goal.   Pain: 0  Pain Goal: 4 [facility] wants you to be able to say your pain was always managed very well.  [name] 04/26/2017

## 2017-04-26 NOTE — Telephone Encounter (Signed)
Preadmission screen  

## 2017-04-27 NOTE — Plan of Care (Signed)
  Activity: Will verbalize the importance of balancing activity with adequate rest periods 04/27/2017 0956 by Karn Cassissborne, Gordy Goar H, RN Note Patient comfortable, ambulating well and comfortable with baby care. Patient has good support from significant other.

## 2017-04-27 NOTE — Progress Notes (Signed)
Post Partum Day 1 Subjective: no complaints, up ad lib, voiding and tolerating PO, small lochia, plans to bottle feed, undecided  Objective: Blood pressure 117/69, pulse 76, temperature 98.4 F (36.9 C), temperature source Oral, resp. rate 16, height 5\' 3"  (1.6 m), weight 80.9 kg (178 lb 6.4 oz), last menstrual period 07/22/2016, SpO2 96 %, unknown if currently breastfeeding.  Physical Exam:  General: alert, cooperative and no distress Lochia:normal flow Chest: CTAB Heart: RRR no m/r/g Abdomen: +BS, soft, nontender,  Uterine Fundus: firm DVT Evaluation: No evidence of DVT seen on physical exam. Extremities: trace edema  Recent Labs    04/26/17 0117  HGB 9.6*  HCT 30.9*    Assessment/Plan: Plan for discharge tomorrow   LOS: 1 day   CRESENZO-DISHMAN,Puanani Gene 04/27/2017, 7:26 AM

## 2017-04-27 NOTE — Anesthesia Postprocedure Evaluation (Signed)
Anesthesia Post Note  Patient: Andrea GalaJocelyn Foster  Procedure(s) Performed: AN AD HOC LABOR EPIDURAL     Patient location during evaluation: Mother Baby Anesthesia Type: Epidural Level of consciousness: awake, awake and alert, oriented and patient cooperative Pain management: pain level controlled Vital Signs Assessment: post-procedure vital signs reviewed and stable Respiratory status: spontaneous breathing, nonlabored ventilation and respiratory function stable Cardiovascular status: stable Postop Assessment: no headache, no backache, patient able to bend at knees and no apparent nausea or vomiting Anesthetic complications: no    Last Vitals:  Vitals:   04/26/17 1835 04/26/17 2322  BP: 128/78 117/69  Pulse: 73 76  Resp: 18 16  Temp: 36.9 C 36.9 C  SpO2:  96%    Last Pain:  Vitals:   04/26/17 2322  TempSrc: Oral  PainSc: 0-No pain   Pain Goal: Patients Stated Pain Goal: 8 (04/26/17 0725)               Candies Palm L

## 2017-04-28 ENCOUNTER — Inpatient Hospital Stay (HOSPITAL_COMMUNITY): Payer: Federal, State, Local not specified - PPO

## 2017-04-28 DIAGNOSIS — O139 Gestational [pregnancy-induced] hypertension without significant proteinuria, unspecified trimester: Secondary | ICD-10-CM

## 2017-04-28 MED ORDER — MEASLES, MUMPS & RUBELLA VAC ~~LOC~~ INJ
0.5000 mL | INJECTION | Freq: Once | SUBCUTANEOUS | Status: AC
  Administered 2017-04-28: 0.5 mL via SUBCUTANEOUS
  Filled 2017-04-28 (×2): qty 0.5

## 2017-04-28 MED ORDER — IBUPROFEN 600 MG PO TABS: 600.0000 mg | ORAL_TABLET | Freq: Four times a day (QID) | ORAL | 0 refills | Status: DC

## 2017-04-28 NOTE — Discharge Instructions (Signed)
Postpartum Care After Vaginal Delivery °The period of time right after you deliver your newborn is called the postpartum period. °What kind of medical care will I receive? °· You may continue to receive fluids and medicines through an IV tube inserted into one of your veins. °· If an incision was made near your vagina (episiotomy) or if you had some vaginal tearing during delivery, cold compresses may be placed on your episiotomy or your tear. This helps to reduce pain and swelling. °· You may be given a squirt bottle to use when you go to the bathroom. You may use this until you are comfortable wiping as usual. To use the squirt bottle, follow these steps: °? Before you urinate, fill the squirt bottle with warm water. Do not use hot water. °? After you urinate, while you are sitting on the toilet, use the squirt bottle to rinse the area around your urethra and vaginal opening. This rinses away any urine and blood. °? You may do this instead of wiping. As you start healing, you may use the squirt bottle before wiping yourself. Make sure to wipe gently. °? Fill the squirt bottle with clean water every time you use the bathroom. °· You will be given sanitary pads to wear. °How can I expect to feel? °· You may not feel the need to urinate for several hours after delivery. °· You will have some soreness and pain in your abdomen and vagina. °· If you are breastfeeding, you may have uterine contractions every time you breastfeed for up to several weeks postpartum. Uterine contractions help your uterus return to its normal size. °· It is normal to have vaginal bleeding (lochia) after delivery. The amount and appearance of lochia is often similar to a menstrual period in the first week after delivery. It will gradually decrease over the next few weeks to a dry, yellow-brown discharge. For most women, lochia stops completely by 6-8 weeks after delivery. Vaginal bleeding can vary from woman to woman. °· Within the first few  days after delivery, you may have breast engorgement. This is when your breasts feel heavy, full, and uncomfortable. Your breasts may also throb and feel hard, tightly stretched, warm, and tender. After this occurs, you may have milk leaking from your breasts. Your health care provider can help you relieve discomfort due to breast engorgement. Breast engorgement should go away within a few days. °· You may feel more sad or worried than normal due to hormonal changes after delivery. These feelings should not last more than a few days. If these feelings do not go away after several days, speak with your health care provider. °How should I care for myself? °· Tell your health care provider if you have pain or discomfort. °· Drink enough water to keep your urine clear or pale yellow. °· Wash your hands thoroughly with soap and water for at least 20 seconds after changing your sanitary pads, after using the toilet, and before holding or feeding your baby. °· If you are not breastfeeding, avoid touching your breasts a lot. Doing this can make your breasts produce more milk. °· If you become weak or lightheaded, or you feel like you might faint, ask for help before: °? Getting out of bed. °? Showering. °· Change your sanitary pads frequently. Watch for any changes in your flow, such as a sudden increase in volume, a change in color, the passing of large blood clots. If you pass a blood clot from your vagina, save it   to show to your health care provider. Do not flush blood clots down the toilet without having your health care provider look at them. °· Make sure that all your vaccinations are up to date. This can help protect you and your baby from getting certain diseases. You may need to have immunizations done before you leave the hospital. °· If desired, talk with your health care provider about methods of family planning or birth control (contraception). °How can I start bonding with my baby? °Spending as much time as  possible with your baby is very important. During this time, you and your baby can get to know each other and develop a bond. Having your baby stay with you in your room (rooming in) can give you time to get to know your baby. Rooming in can also help you become comfortable caring for your baby. Breastfeeding can also help you bond with your baby. °How can I plan for returning home with my baby? °· Make sure that you have a car seat installed in your vehicle. °? Your car seat should be checked by a certified car seat installer to make sure that it is installed safely. °? Make sure that your baby fits into the car seat safely. °· Ask your health care provider any questions you have about caring for yourself or your baby. Make sure that you are able to contact your health care provider with any questions after leaving the hospital. °This information is not intended to replace advice given to you by your health care provider. Make sure you discuss any questions you have with your health care provider. °Document Released: 02/12/2007 Document Revised: 09/20/2015 Document Reviewed: 03/22/2015 °Elsevier Interactive Patient Education © 2018 [employer] Inc. ° ° °Contraception Choices °Contraception, also called birth control, means things to use or ways to try not to get pregnant. °Hormonal birth control °This kind of birth control uses hormones. Here are some types of hormonal birth control: °· A tube that is put under skin of the arm (implant). The tube can stay in for as long as 3 years. °· Shots to get every 3 months (injections). °· Pills to take every day (birth control pills). °· A patch to change 1 time each week for 3 weeks (birth control patch). After that, the patch is taken off for 1 week. °· A ring to put in the vagina. The ring is left in for 3 weeks. Then it is taken out of the vagina for 1 week. Then a new ring is put in. °· Pills to take after unprotected sex (emergency birth control pills). ° °Barrier birth  control °Here are some types of barrier birth control: °· A thin covering that is put on the penis before sex (female condom). The covering is thrown away after sex. °· A soft, loose covering that is put in the vagina before sex (female condom). The covering is thrown away after sex. °· A rubber bowl that sits over the cervix (diaphragm). The bowl must be made for you. The bowl is put into the vagina before sex. The bowl is left in for 6-8 hours after sex. It is taken out within 24 hours. °· A small, soft cup that fits over the cervix (cervical cap). The cup must be made for you. The cup can be left in for 6-8 hours after sex. It is taken out within 48 hours. °· A sponge that is put into the vagina before sex. It must be left in for at least 6   hours after sex. It must be taken out within 30 hours. Then it is thrown away.  A chemical that kills or stops sperm from getting into the uterus (spermicide). It may be a pill, cream, jelly, or foam to put in the vagina. The chemical should be used at least 10-15 minutes before sex.  IUD (intrauterine) birth control An IUD is a small, T-shaped piece of plastic. It is put inside the uterus. There are two kinds:  Hormone IUD. This kind can stay in for 3-5 years.  Copper IUD. This kind can stay in for 10 years.  Permanent birth control Here are some types of permanent birth control:  Surgery to block the fallopian tubes.  Having an insert put into each fallopian tube.  Surgery to tie off the tubes that carry sperm (vasectomy).  Natural planning birth control Here are some types of natural planning birth control:  Not having sex on the days the woman could get pregnant.  Using a calendar: ? To keep track of the length of each period. ? To find out what days pregnancy can happen. ? To plan to not have sex on days when pregnancy can happen.  Watching for symptoms of ovulation and not having sex during ovulation. One way the woman can check for ovulation  is to check her temperature.  Waiting to have sex until after ovulation.  Summary  Contraception, also called birth control, means things to use or ways to try not to get pregnant.  Hormonal methods of birth control include implants, injections, pills, patches, vaginal rings, and emergency birth control pills.  Barrier methods of birth control can include female condoms, female condoms, diaphragms, cervical caps, sponges, and spermicides.  There are two types of IUD (intrauterine device) birth control. An IUD can be put in a woman's uterus to prevent pregnancy for 3-5 years.  Permanent sterilization can be done through a procedure for males, females, or both.  Natural planning methods involve not having sex on the days when the woman could get pregnant. This information is not intended to replace advice given to you by your health care provider. Make sure you discuss any questions you have with your health care provider. Document Released: 02/12/2009 Document Revised: 04/27/2016 Document Reviewed: 04/27/2016 Elsevier Interactive Patient Education  2017 [employer]

## 2017-04-28 NOTE — Discharge Summary (Signed)
OB Discharge Summary     Patient Name: Andrea Foster DOB: 05-25-1990 MRN: [id number]  Date of admission: 04/26/2017 Delivering MD: Levie HeritageSTINSON, JACOB J   Date of discharge: 04/28/2017  Admitting diagnosis: 39 WEEKS CTX Intrauterine pregnancy: 5710w5d     Secondary diagnosis:  Principal Problem:   SVD (spontaneous vaginal delivery) Active Problems:   Polyhydramnios in third trimester, antepartum complication   Late prenatal care affecting pregnancy   Indication for care in labor or delivery   Gestational hypertension   Discharge diagnosis: Term Pregnancy Delivered and Gestational Hypertension                                                                                             Post partum procedures:none  Augmentation: AROM and Pitocin  Complications: None  Hospital course:  Onset of Labor With Vaginal Delivery     26 y.o. yo G1P1001 at 6910w5d was admitted in Active Labor on 04/26/2017. Patient had an uncomplicated labor course as follows:  Membrane Rupture Time/Date: 7:32 AM ,04/26/2017   Intrapartum Procedures: Episiotomy: None [1]                                         Lacerations:  None [1]  Patient had a delivery of a Viable infant. 04/26/2017  Information for the patient's newborn:  Denton BrickFrazier, Girl Yitta [[id number]][[id number]]  Delivery Method: Vaginal, Spontaneous(Filed from Delivery Summary)    Pateint had an uncomplicated postpartum course.  She is ambulating, tolerating a regular diet, passing flatus, and urinating well. Patient is discharged home in stable condition on 04/28/17.   Physical exam  Vitals:   04/27/17 0534 04/27/17 0800 04/27/17 2225 04/28/17 0500  BP:  119/73 119/74 126/79  Pulse:  67 88 (!) 57  Resp:  18 18 18   Temp:  98.5 F (36.9 C) 97.7 F (36.5 C) (!) 97.4 F (36.3 C)  TempSrc:  Oral Oral   SpO2:      Weight: 178 lb 6.4 oz (80.9 kg)   177 lb 12.8 oz (80.6 kg)  Height:       General: alert, cooperative and no distress Lochia:  appropriate Uterine Fundus: firm Incision: N/A DVT Evaluation: No evidence of DVT seen on physical exam. No significant calf/ankle edema. Labs: Lab Results  Component Value Date   WBC 10.5 04/26/2017   HGB 9.6 (L) 04/26/2017   HCT 30.9 (L) 04/26/2017   MCV 86.3 04/26/2017   PLT 282 04/26/2017   CMP Latest Ref Rng & Units 04/26/2017  Glucose 65 - 99 mg/dL 98  BUN 6 - 20 mg/dL 7  Creatinine 2.950.44 - 2.841.00 mg/dL 1.320.65  Sodium 440135 - 102145 mmol/L 136  Potassium 3.5 - 5.1 mmol/L 4.7  Chloride 101 - 111 mmol/L 106  CO2 22 - 32 mmol/L 21(L)  Calcium 8.9 - 10.3 mg/dL 8.9  Total Protein 6.5 - 8.1 g/dL 6.5  Total Bilirubin 0.3 - 1.2 mg/dL 0.4  Alkaline Phos 38 - 126 U/L 189(H)  AST 15 - 41 U/L 26  ALT  14 - 54 U/L 20    Discharge instruction: per After Visit Summary and "Baby and Me Booklet".  After visit meds:  Allergies as of 04/28/2017      Reactions   Latex Hives   Other Anaphylaxis   walnuts      Medication List    STOP taking these medications   cyclobenzaprine 10 MG tablet Commonly known as:  FLEXERIL     TAKE these medications   albuterol 108 (90 Base) MCG/ACT inhaler Commonly known as:  PROVENTIL HFA;VENTOLIN HFA Inhale 1-2 puffs into the lungs every 6 (six) hours as needed for wheezing or shortness of breath.   fluticasone 50 MCG/ACT nasal spray Commonly known as:  FLONASE Place 1 spray into both nostrils daily as needed for allergies or rhinitis.   ibuprofen 600 MG tablet Commonly known as:  ADVIL,MOTRIN Take 1 tablet (600 mg total) by mouth every 6 (six) hours.   prenatal vitamin w/FE, FA 29-1 MG Chew chewable tablet Chew 1 tablet by mouth daily at 12 noon.       Diet: routine diet  Activity: Advance as tolerated. Pelvic rest for 6 weeks.   Outpatient follow up: 1 week BP check (message sent to admin pool) Postpartum contraception: Undecided  Newborn Data: Live born female  Birth Weight: 8 lb 4.5 oz (3756 g) APGAR: 8, 9  Newborn Delivery    Birth date/time:  04/26/2017 15:00:00 Delivery type:  Vaginal, Spontaneous     Baby Feeding: Bottle Disposition:home with mother   04/28/2017 Frederik PearJulie P Rosendo Couser, MD

## 2017-05-03 ENCOUNTER — Ambulatory Visit: Payer: Federal, State, Local not specified - PPO | Admitting: *Deleted

## 2017-05-03 ENCOUNTER — Encounter: Payer: Self-pay | Admitting: *Deleted

## 2017-05-03 VITALS — BP 127/87

## 2017-05-03 DIAGNOSIS — Z0130 Encounter for examination of blood pressure without abnormal findings: Secondary | ICD-10-CM

## 2017-05-03 NOTE — Progress Notes (Signed)
Pt presents for blood pressure check, 1 week postpartum. Denies any headaches, blurry vision or URQ abd pain. BP WNL. Reviewed s/s of preeclampsia, understanding voiced.

## 2017-05-08 ENCOUNTER — Encounter: Payer: Self-pay | Admitting: *Deleted

## 2017-05-31 ENCOUNTER — Ambulatory Visit: Payer: Federal, State, Local not specified - PPO | Admitting: Student

## 2017-06-01 ENCOUNTER — Ambulatory Visit: Payer: Federal, State, Local not specified - PPO | Admitting: Obstetrics and Gynecology

## 2017-06-01 ENCOUNTER — Encounter: Payer: Self-pay | Admitting: General Practice

## 2017-06-05 ENCOUNTER — Encounter: Payer: Self-pay | Admitting: Family Medicine

## 2017-06-05 ENCOUNTER — Ambulatory Visit (INDEPENDENT_AMBULATORY_CARE_PROVIDER_SITE_OTHER): Payer: Federal, State, Local not specified - PPO | Admitting: Family Medicine

## 2017-06-05 DIAGNOSIS — Z1389 Encounter for screening for other disorder: Secondary | ICD-10-CM

## 2017-06-05 NOTE — Progress Notes (Signed)
Post Partum Exam  Andrea Foster is a 27 y.o. 991P1001 female who presents for a postpartum visit. She is 5 weeks postpartum following a spontaneous vaginal delivery. I have fully reviewed the prenatal and intrapartum course. Postpartum course has been normal. Baby's course has been normal. Baby is feeding by breast. Bleeding thin lochia. Bowel function is normal. Bladder function is normal. Patient is sexually active. Contraception method is condoms. Postpartum depression screening:neg   Review of Systems Pertinent items are noted in HPI.    Objective:  Blood pressure 111/74, pulse 87, height 5\' 3"  (1.6 m), weight 167 lb 6.4 oz (75.9 kg), last menstrual period 05/31/2017, not currently breastfeeding.  General:  alert, cooperative and appears stated age   Breasts:  inspection negative, no nipple discharge or bleeding, no masses or nodularity palpable  Lungs: clear to auscultation bilaterally and normal percussion bilaterally  Heart:  regular rate and rhythm, S1, S2 normal, no murmur, click, rub or gallop  Abdomen: soft, non-tender; bowel sounds normal; no masses,  no organomegaly  Psych  no signs of depression or anxiety                       Assessment:    6 week postpartum exam. Pap smear not done at today's visit. Last pap smear 10/30/2016.  Plan:   1. Contraception: condoms. Plans to get pregnant in 1.5 years. Advised daily folic acid.  2. Blood pressure well controlled without meds. 3. Follow up as needed.

## 2017-11-26 ENCOUNTER — Encounter: Payer: Self-pay | Admitting: General Practice

## 2017-11-26 ENCOUNTER — Ambulatory Visit (INDEPENDENT_AMBULATORY_CARE_PROVIDER_SITE_OTHER): Payer: Federal, State, Local not specified - PPO | Admitting: General Practice

## 2017-11-26 DIAGNOSIS — Z3201 Encounter for pregnancy test, result positive: Secondary | ICD-10-CM | POA: Diagnosis not present

## 2017-11-26 LAB — POCT PREGNANCY, URINE: Preg Test, Ur: POSITIVE — AB

## 2017-11-26 MED ORDER — PRENATAL VITAMINS 0.8 MG PO TABS: 1.0000 | ORAL_TABLET | Freq: Every day | ORAL | 2 refills | Status: DC

## 2017-11-26 NOTE — Progress Notes (Signed)
Chart reviewed for nurse visit. Agree with plan of care.   Judeth HornLawrence, Necia Kamm, NP 11/26/2017 4:38 PM

## 2017-11-26 NOTE — Progress Notes (Signed)
Patient presents to office today for UPT. UPT +. Patient reports first positive home test last Thursday. LMP 10/19/17 EDD 07/26/18 6558w3d. Patient denies taking any meds or vitamins- would like PNV rx. Rx sent in per protocol & recommended she begin OB care. Patient verbalized understanding & had no questions.

## 2017-12-08 IMAGING — US US MFM FETAL BPP W/O NON-STRESS
1 series · 12 of 19 positions shown · non-contrast
Comparison: none

[Series 1: us mfm fetal bpp w/o non-stress · 19 acquisitions, 12 frames shown]
[im 1/19]
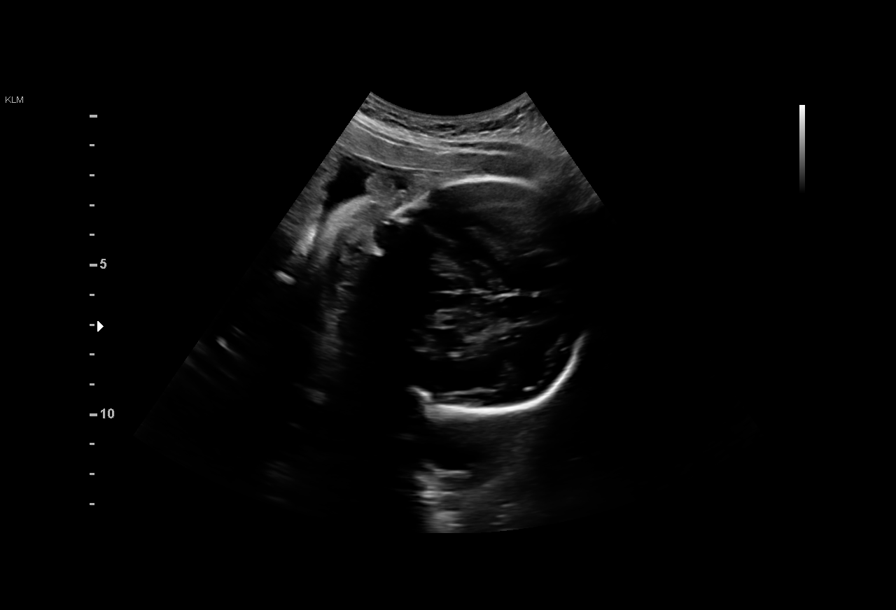
[im 3/19]
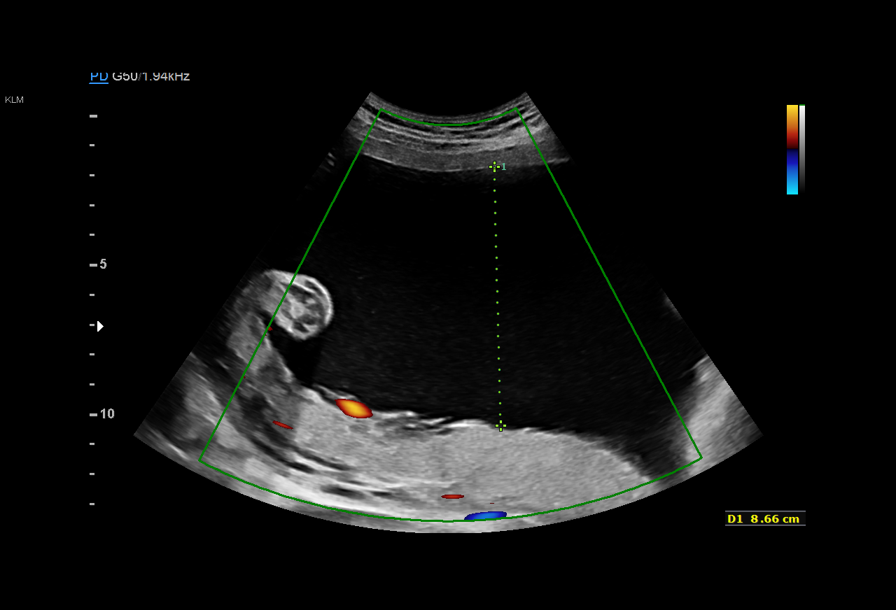
[im 4/19]
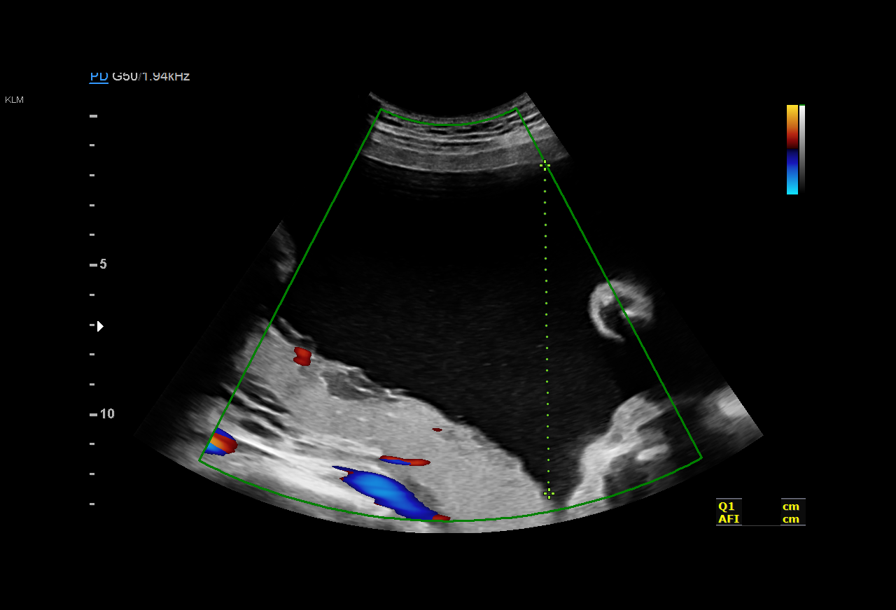
[im 6/19]
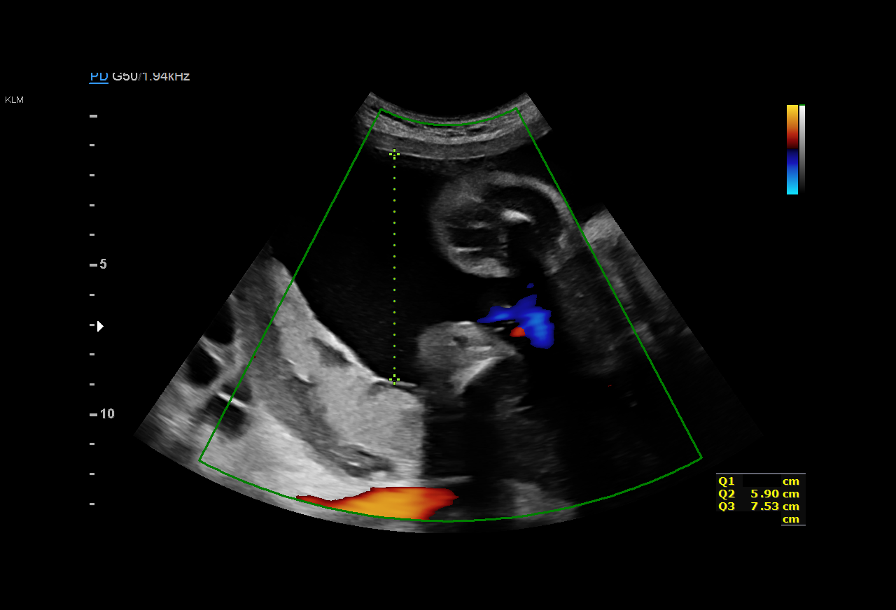
[im 8/19]
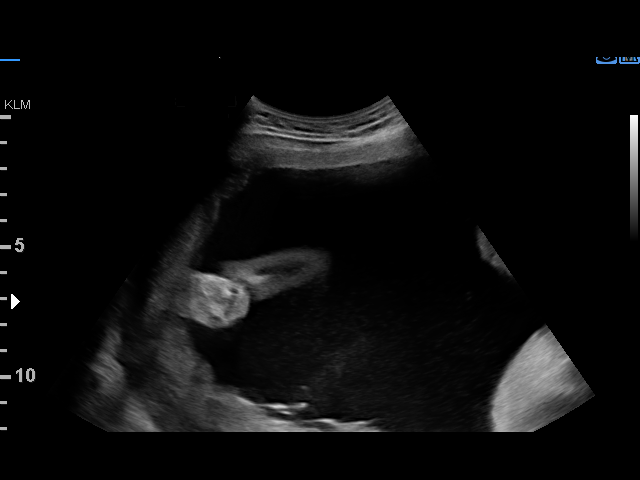
[im 9/19]
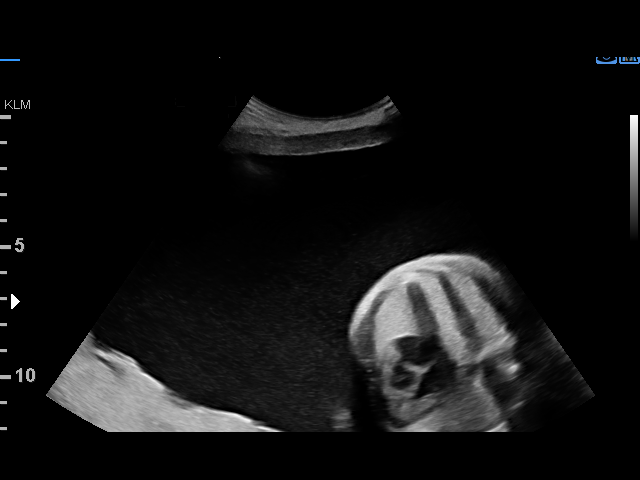
[im 11/19]
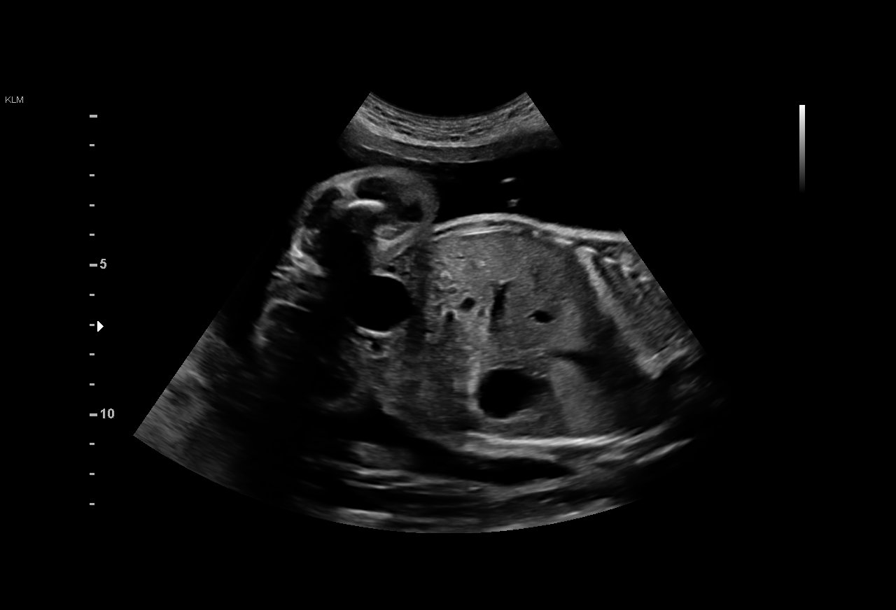
[im 12/19]
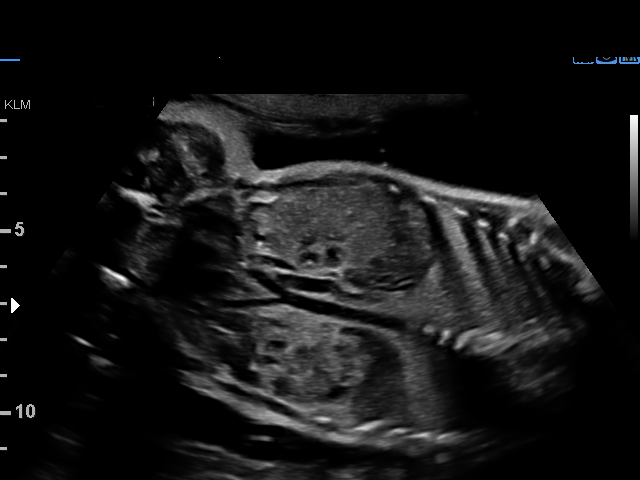
[im 14/19]
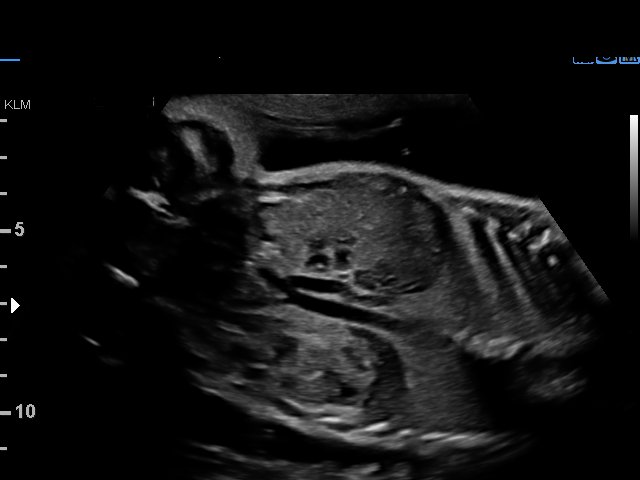
[im 16/19]
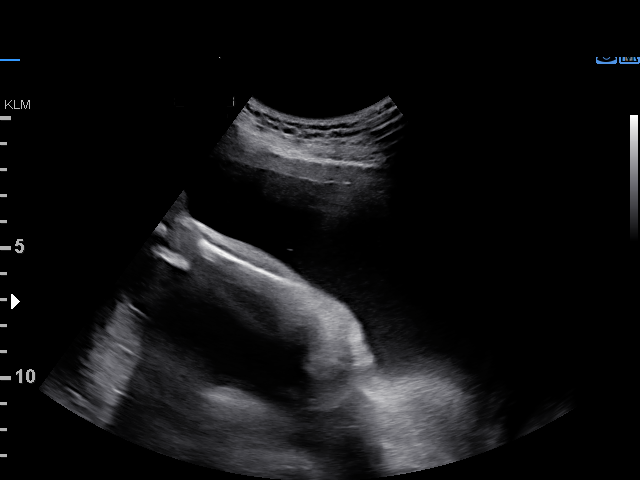
[im 17/19]
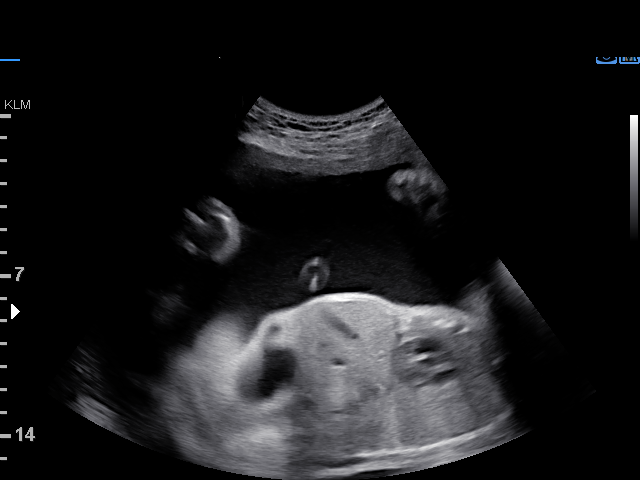
[im 19/19]
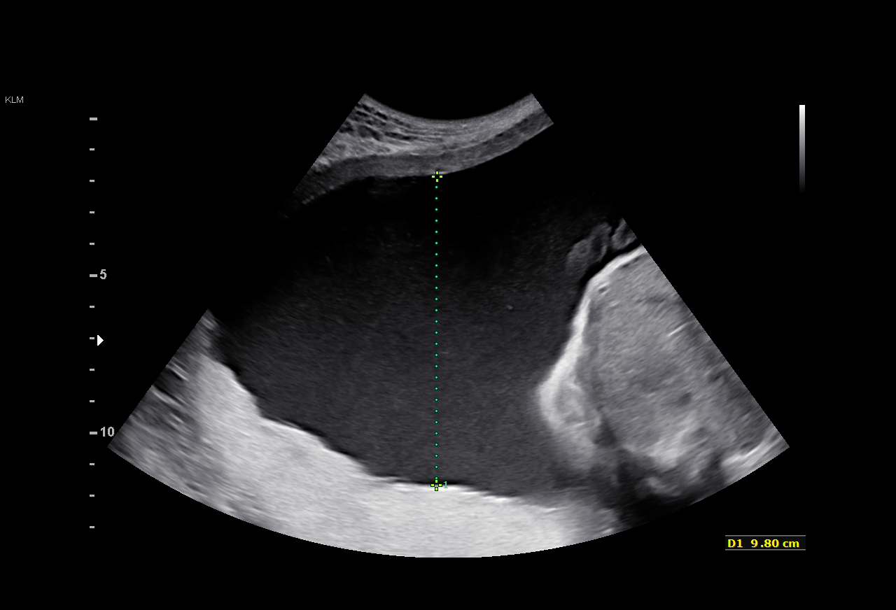

[12 of 19 positions shown; findings below may reference images not displayed]

[facility] Clinic

1  LORENZ JUMPER           [id number]      [id number]     [id number]
Indications

28 weeks gestation of pregnancy
Polyhydramnios, third trimester, antepartum
condition or complication, unspecified fetus
OB History

Gravidity:    1         Term:   0        Prem:   0        SAB:   0
TOP:          0       Ectopic:  0        Living: 0
Fetal Evaluation

Num Of Fetuses:     1
Fetal Heart         150
Rate(bpm):
Cardiac Activity:   Observed
Presentation:       Cephalic

Amniotic Fluid
AFI FV:      Polyhydramnios

AFI Sum(cm)     %Tile       Largest Pocket(cm)
28.1            > 97

RUQ(cm)       RLQ(cm)       LUQ(cm)        LLQ(cm)
10.97

Comment:    [DATE] BPP IN 9 MINS.
Biophysical Evaluation

Amniotic F.V:   Polyhydramnios             F. Tone:        Observed
F. Movement:    Observed                   Score:          [DATE]
F. Breathing:   Observed
Gestational Age

LMP:           28w 3d        Date:  07/22/16                 EDD:   04/28/17
Best:          28w 3d     Det. By:  LMP  (07/22/16)          EDD:   04/28/17
Impression

Singleton intrauterine pregnancy at 28 weeks 3 days
gestation with fetal cardiac activity
Cephalic presentation
Polyhydramnios; normal stomach
BPP [DATE]
Recommendations

Follow-up ultrasound in 2 weeks to reassess growth and AFV

## 2017-12-09 ENCOUNTER — Inpatient Hospital Stay (HOSPITAL_COMMUNITY)
Admission: AD | Admit: 2017-12-09 | Discharge: 2017-12-09 | Disposition: A | Discharge status: Home / Self Care | Payer: Federal, State, Local not specified - PPO | Source: Ambulatory Visit | Attending: Obstetrics and Gynecology | Admitting: Obstetrics and Gynecology

## 2017-12-09 ENCOUNTER — Encounter (HOSPITAL_COMMUNITY): Payer: Self-pay | Admitting: *Deleted

## 2017-12-09 ENCOUNTER — Other Ambulatory Visit: Payer: Self-pay

## 2017-12-09 DIAGNOSIS — O219 Vomiting of pregnancy, unspecified: Secondary | ICD-10-CM | POA: Insufficient documentation

## 2017-12-09 DIAGNOSIS — R42 Dizziness and giddiness: Secondary | ICD-10-CM | POA: Diagnosis not present

## 2017-12-09 DIAGNOSIS — O99511 Diseases of the respiratory system complicating pregnancy, first trimester: Secondary | ICD-10-CM | POA: Insufficient documentation

## 2017-12-09 DIAGNOSIS — Z7951 Long term (current) use of inhaled steroids: Secondary | ICD-10-CM | POA: Diagnosis not present

## 2017-12-09 DIAGNOSIS — O26891 Other specified pregnancy related conditions, first trimester: Secondary | ICD-10-CM | POA: Insufficient documentation

## 2017-12-09 DIAGNOSIS — Z3A01 Less than 8 weeks gestation of pregnancy: Secondary | ICD-10-CM | POA: Insufficient documentation

## 2017-12-09 DIAGNOSIS — J45909 Unspecified asthma, uncomplicated: Secondary | ICD-10-CM | POA: Diagnosis not present

## 2017-12-09 DIAGNOSIS — O9989 Other specified diseases and conditions complicating pregnancy, childbirth and the puerperium: Secondary | ICD-10-CM

## 2017-12-09 DIAGNOSIS — Z79899 Other long term (current) drug therapy: Secondary | ICD-10-CM | POA: Insufficient documentation

## 2017-12-09 DIAGNOSIS — R55 Syncope and collapse: Secondary | ICD-10-CM | POA: Insufficient documentation

## 2017-12-09 LAB — URINALYSIS, ROUTINE W REFLEX MICROSCOPIC
Bilirubin Urine: NEGATIVE
Glucose, UA: NEGATIVE mg/dL
Hgb urine dipstick: NEGATIVE
Ketones, ur: 20 mg/dL — AB
Nitrite: NEGATIVE
Protein, ur: 30 mg/dL — AB
Specific Gravity, Urine: 1.032 — ABNORMAL HIGH (ref 1.005–1.030)
pH: 6 (ref 5.0–8.0)

## 2017-12-09 LAB — COMPREHENSIVE METABOLIC PANEL
ALT: 14 U/L (ref 0–44)
AST: 16 U/L (ref 15–41)
Albumin: 3.6 g/dL (ref 3.5–5.0)
Alkaline Phosphatase: 65 U/L (ref 38–126)
Anion gap: 8 (ref 5–15)
BUN: 9 mg/dL (ref 6–20)
CO2: 21 mmol/L — ABNORMAL LOW (ref 22–32)
Calcium: 9 mg/dL (ref 8.9–10.3)
Chloride: 104 mmol/L (ref 98–111)
Creatinine, Ser: 0.65 mg/dL (ref 0.44–1.00)
GFR calc Af Amer: >60 mL/min (ref >60)
GFR calc non Af Amer: >60 mL/min (ref >60)
Glucose, Bld: 114 mg/dL — ABNORMAL HIGH (ref 70–99)
Potassium: 3.8 mmol/L (ref 3.5–5.1)
Sodium: 133 mmol/L — ABNORMAL LOW (ref 135–145)
Total Bilirubin: 0.7 mg/dL (ref 0.3–1.2)
Total Protein: 6.8 g/dL (ref 6.5–8.1)

## 2017-12-09 LAB — CBC
HCT: 33.3 % — ABNORMAL LOW (ref 36.0–46.0)
Hemoglobin: 11.5 g/dL — ABNORMAL LOW (ref 12.0–15.0)
MCH: 28.8 pg (ref 26.0–34.0)
MCHC: 34.5 g/dL (ref 30.0–36.0)
MCV: 83.5 fL (ref 78.0–100.0)
Platelets: 237 10*3/uL (ref 150–400)
RBC: 3.99 MIL/uL (ref 3.87–5.11)
RDW: 12.8 % (ref 11.5–15.5)
WBC: 6.6 10*3/uL (ref 4.0–10.5)

## 2017-12-09 MED ORDER — PROMETHAZINE HCL 25 MG/ML IJ SOLN
25.0000 mg | Freq: Once | INTRAVENOUS | Status: AC
  Administered 2017-12-09: 25 mg via INTRAVENOUS
  Filled 2017-12-09: qty 1

## 2017-12-09 MED ORDER — PROMETHAZINE HCL 25 MG PO TABS: 25.0000 mg | ORAL_TABLET | Freq: Four times a day (QID) | ORAL | 2 refills | Status: DC | PRN

## 2017-12-09 NOTE — MAU Note (Signed)
Pt presents with c/o N/V, can't keep anything down.  Reports has vomited 3 times in 24 hours.  Denies VB or abdominal cramping.

## 2017-12-09 NOTE — Discharge Instructions (Signed)
Morning Sickness AMorning sickness is when you feel sick to your stomach (nauseous) during pregnancy. This nauseous feeling may or may not come with vomiting. It often occurs in the morning but can be a problem any time of day. Morning sickness is most common during the first trimester, but it may continue throughout pregnancy. While morning sickness is unpleasant, it is usually harmless unless you develop severe and continual vomiting (hyperemesis gravidarum). This condition requires more intense treatment. What are the causes? The cause of morning sickness is not completely known but seems to be related to normal hormonal changes that occur in pregnancy. What increases the risk? You are at greater risk if you:  Experienced nausea or vomiting before your pregnancy.  Had morning sickness during a previous pregnancy.  Are pregnant with more than one baby, such as twins.  How is this treated? Do not use any medicines (prescription, over-the-counter, or herbal) for morning sickness without first talking to your health care provider. Your health care provider may prescribe or recommend:  Vitamin B6 supplements.  Anti-nausea medicines.  The herbal medicine ginger.  Follow these instructions at home:  Only take over-the-counter or prescription medicines as directed by your health care provider.  Taking multivitamins before getting pregnant can prevent or decrease the severity of morning sickness in most women.  Eat a piece of dry toast or unsalted crackers before getting out of bed in the morning.  Eat five or six small meals a day.  Eat dry and bland foods (rice, baked potato). Foods high in carbohydrates are often helpful.  Do not drink liquids with your meals. Drink liquids between meals.  Avoid greasy, fatty, and spicy foods.  Get someone to cook for you if the smell of any food causes nausea and vomiting.  If you feel nauseous after taking prenatal vitamins, take the vitamins  at night or with a snack.  Snack on protein foods (nuts, yogurt, cheese) between meals if you are hungry.  Eat unsweetened gelatins for desserts.  Wearing an acupressure wristband (worn for sea sickness) may be helpful.  Acupuncture may be helpful.  Do not smoke.  Get a humidifier to keep the air in your house free of odors.  Get plenty of fresh air. Contact a health care provider if:  Your home remedies are not working, and you need medicine.  You feel dizzy or lightheaded.  You are losing weight. Get help right away if:  You have persistent and uncontrolled nausea and vomiting.  You pass out (faint). This information is not intended to replace advice given to you by your health care provider. Make sure you discuss any questions you have with your health care provider. Document Released: 06/08/2006 Document Revised: 09/23/2015 Document Reviewed: 10/02/2012 Elsevier Interactive Patient Education  2017 [employer]   Dizziness Dizziness is a common problem. It is a feeling of unsteadiness or light-headedness. You may feel like you are about to faint. Dizziness can lead to injury if you stumble or fall. Anyone can become dizzy, but dizziness is more common in older adults. This condition can be caused by a number of things, including medicines, dehydration, or illness. Follow these instructions at home: Eating and drinking  Drink enough fluid to keep your urine clear or pale yellow. This helps to keep you from becoming dehydrated. Try to drink more clear fluids, such as water.  Do not drink alcohol.  Limit your caffeine intake if told to do so by your health care provider. Check ingredients and  nutrition facts to see if a food or beverage contains caffeine.  Limit your salt (sodium) intake if told to do so by your health care provider. Check ingredients and nutrition facts to see if a food or beverage contains sodium. Activity  Avoid making quick movements. ? Rise  slowly from chairs and steady yourself until you feel okay. ? In the morning, first sit up on the side of the bed. When you feel okay, stand slowly while you hold onto something until you know that your balance is fine.  If you need to stand in one place for a long time, move your legs often. Tighten and relax the muscles in your legs while you are standing.  Do not drive or use heavy machinery if you feel dizzy.  Avoid bending down if you feel dizzy. Place items in your home so that they are easy for you to reach without leaning over. Lifestyle  Do not use any products that contain nicotine or tobacco, such as cigarettes and e-cigarettes. If you need help quitting, ask your health care provider.  Try to reduce your stress level by using methods such as yoga or meditation. Talk with your health care provider if you need help to manage your stress. General instructions  Watch your dizziness for any changes.  Take over-the-counter and prescription medicines only as told by your health care provider. Talk with your health care provider if you think that your dizziness is caused by a medicine that you are taking.  Tell a friend or a family member that you are feeling dizzy. If he or she notices any changes in your behavior, have this person call your health care provider.  Keep all follow-up visits as told by your health care provider. This is important. Contact a health care provider if:  Your dizziness does not go away.  Your dizziness or light-headedness gets worse.  You feel nauseous.  You have reduced hearing.  You have new symptoms.  You are unsteady on your feet or you feel like the room is spinning. Get help right away if:  You vomit or have diarrhea and are unable to eat or drink anything.  You have problems talking, walking, swallowing, or using your arms, hands, or legs.  You feel generally weak.  You are not thinking clearly or you have trouble forming sentences. It  may take a friend or family member to notice this.  You have chest pain, abdominal pain, shortness of breath, or sweating.  Your vision changes.  You have any bleeding.  You have a severe headache.  You have neck pain or a stiff neck.  You have a fever. These symptoms may represent a serious problem that is an emergency. Do not wait to see if the symptoms will go away. Get medical help right away. Call your local emergency services (911 in the [place] Do not drive yourself to the hospital. Summary  Dizziness is a feeling of unsteadiness or light-headedness. This condition can be caused by a number of things, including medicines, dehydration, or illness.  Anyone can become dizzy, but dizziness is more common in older adults.  Drink enough fluid to keep your urine clear or pale yellow. Do not drink alcohol.  Avoid making quick movements if you feel dizzy. Monitor your dizziness for any changes. This information is not intended to replace advice given to you by your health care provider. Make sure you discuss any questions you have with your health care provider. Document Released: 10/11/2000  Document Revised: 05/20/2016 Document Reviewed: 05/20/2016 Elsevier Interactive Patient Education  [employer].

## 2017-12-09 NOTE — MAU Provider Note (Signed)
Chief Complaint: Emesis; Nausea; and Fainting Spells   First Provider Initiated Contact with Patient 12/09/17 1659     SUBJECTIVE HPI: Andrea Foster is a 27 y.o. G2P1001 at 4763w2d who presents to Maternity Admissions reporting N/V, near-syncope. N/V have been gon on for about 2 weeks. Over the past few days N/V have worsened and pt reports vision going black when going from sitting to standing on a few occassions. Was able to sit down when this occurred without falling. Doesn't think she lost consciousness. No Hx syncope or near-syncope. Didn't have significant NV w/ previous pregnancy.   Modifying factors: Hasn't tried any meds to Tx N/V.  Associated signs and symptoms: Neg for fever, chills, diarrhea, sick contacts, HA, weakness, difficulties w/ speech or gait. Brief sensation of vision going black, which returned to normal after sitting.   Past Medical History:  Diagnosis Date  . Asthma    seasonal as a child  . Gonorrhea   . Hx of chlamydia infection 2013   OB History  Gravida Para Term Preterm AB Living  2 1 1  0 0 1  SAB TAB Ectopic Multiple Live Births  0 0 0 0 1    # Outcome Date GA Lbr Len/2nd Weight Sex Delivery Anes PTL Lv  2 Current           1 Term 04/26/17 5859w5d 13:32 / 00:27 3756 g F Vag-Spont EPI  LIV     Birth Comments: caput   Past Surgical History:  Procedure Laterality Date  . NO PAST SURGERIES     Social History   Socioeconomic History  . Marital status: Single    Spouse name: Not on file  . Number of children: Not on file  . Years of education: Not on file  . Highest education level: Not on file  Occupational History  . Not on file  Social Needs  . Financial resource strain: Not on file  . Food insecurity:    Worry: Not on file    Inability: Not on file  . Transportation needs:    Medical: Not on file    Non-medical: Not on file  Tobacco Use  . Smoking status: Never Smoker  . Smokeless tobacco: Never Used  Substance and Sexual Activity  .  Alcohol use: No    Alcohol/week: 1.0 standard drinks    Types: 1 Shots of liquor per week    Comment: rare, spicial occasions, not while preg  . Drug use: No  . Sexual activity: Yes    Birth control/protection: None  Lifestyle  . Physical activity:    Days per week: Not on file    Minutes per session: Not on file  . Stress: Not on file  Relationships  . Social connections:    Talks on phone: Not on file    Gets together: Not on file    Attends religious service: Not on file    Active member of club or organization: Not on file    Attends meetings of clubs or organizations: Not on file    Relationship status: Not on file  . Intimate partner violence:    Fear of current or ex partner: Not on file    Emotionally abused: Not on file    Physically abused: Not on file    Forced sexual activity: Not on file  Other Topics Concern  . Not on file  Social History Narrative  . Not on file   Family History  Problem Relation Age of Onset  .  Cancer Maternal Grandfather        ? agent orange  . Asthma Neg Hx   . Diabetes Neg Hx   . Heart disease Neg Hx   . Hypertension Neg Hx   . Stroke Neg Hx    No current facility-administered medications on file prior to encounter.    Current Outpatient Medications on File Prior to Encounter  Medication Sig Dispense Refill  . albuterol (PROVENTIL HFA;VENTOLIN HFA) 108 (90 Base) MCG/ACT inhaler Inhale 1-2 puffs into the lungs every 6 (six) hours as needed for wheezing or shortness of breath.    . fluticasone (FLONASE) 50 MCG/ACT nasal spray Place 1 spray into both nostrils daily as needed for allergies or rhinitis.    . Multiple Vitamins-Minerals (MULTIVITAMIN WITH MINERALS) tablet Take 1 tablet by mouth daily.    . Prenatal Multivit-Min-Fe-FA (PRENATAL VITAMINS) 0.8 MG tablet Take 1 tablet by mouth daily. 30 tablet 2   Allergies  Allergen Reactions  . Latex Hives  . Other Anaphylaxis    walnuts    I have reviewed patient's Past Medical Hx,  Surgical Hx, Family Hx, Social Hx, medications and allergies.   Review of Systems  Constitutional: Negative for appetite change, chills and fever.  Eyes: Positive for visual disturbance.  Cardiovascular: Negative for palpitations.  Gastrointestinal: Positive for nausea and vomiting. Negative for abdominal pain, constipation and diarrhea.  Genitourinary: Negative for vaginal bleeding.  Neurological: Positive for light-headedness. Negative for seizures, syncope, facial asymmetry, speech difficulty, weakness, numbness and headaches.  Psychiatric/Behavioral: Negative for confusion.    OBJECTIVE Patient Vitals for the past 24 hrs:  BP Temp Temp src Pulse Resp SpO2 Height Weight  12/09/17 1502 103/67 98.2 F (36.8 C) Oral 99 17 98 % 5\' 3"  (1.6 m) 72.8 kg   Constitutional: Well-developed, well-nourished female in no acute distress.  Head: Mucus membranes moist Skin: No pallor Cardiovascular: normal rate Respiratory: normal rate and effort.  Neurologic: Alert and oriented x 4. Nml speech and gait GU: Deferred  LAB RESULTS Results for orders placed or performed during the hospital encounter of 12/09/17 (from the past 24 hour(s))  Urinalysis, Routine w reflex microscopic     Status: Abnormal   Collection Time: 12/09/17  3:29 PM  Result Value Ref Range   Color, Urine AMBER (A) YELLOW   APPearance HAZY (A) CLEAR   Specific Gravity, Urine 1.032 (H) 1.005 - 1.030   pH 6.0 5.0 - 8.0   Glucose, UA NEGATIVE NEGATIVE mg/dL   Hgb urine dipstick NEGATIVE NEGATIVE   Bilirubin Urine NEGATIVE NEGATIVE   Ketones, ur 20 (A) NEGATIVE mg/dL   Protein, ur 30 (A) NEGATIVE mg/dL   Nitrite NEGATIVE NEGATIVE   Leukocytes, UA TRACE (A) NEGATIVE   RBC / HPF 0-5 0 - 5 RBC/hpf   WBC, UA 6-10 0 - 5 WBC/hpf   Bacteria, UA RARE (A) NONE SEEN   Squamous Epithelial / LPF 0-5 0 - 5   Mucus PRESENT   Comprehensive metabolic panel     Status: Abnormal   Collection Time: 12/09/17  7:10 PM  Result Value Ref Range    Sodium 133 (L) 135 - 145 mmol/L   Potassium 3.8 3.5 - 5.1 mmol/L   Chloride 104 98 - 111 mmol/L   CO2 21 (L) 22 - 32 mmol/L   Glucose, Bld 114 (H) 70 - 99 mg/dL   BUN 9 6 - 20 mg/dL   Creatinine, Ser 1.61 0.44 - 1.00 mg/dL   Calcium 9.0 8.9 - 10.3  mg/dL   Total Protein 6.8 6.5 - 8.1 g/dL   Albumin 3.6 3.5 - 5.0 g/dL   AST 16 15 - 41 U/L   ALT 14 0 - 44 U/L   Alkaline Phosphatase 65 38 - 126 U/L   Total Bilirubin 0.7 0.3 - 1.2 mg/dL   GFR calc non Af Amer >60 >60 mL/min   GFR calc Af Amer >60 >60 mL/min   Anion gap 8 5 - 15  CBC     Status: Abnormal   Collection Time: 12/09/17  7:10 PM  Result Value Ref Range   WBC 6.6 4.0 - 10.5 K/uL   RBC 3.99 3.87 - 5.11 MIL/uL   Hemoglobin 11.5 (L) 12.0 - 15.0 g/dL   HCT 16.1 (L) 09.6 - 04.5 %   MCV 83.5 78.0 - 100.0 fL   MCH 28.8 26.0 - 34.0 pg   MCHC 34.5 30.0 - 36.0 g/dL   RDW 40.9 81.1 - 91.4 %   Platelets 237 150 - 400 K/uL    IMAGING No results found.  MAU COURSE Orders Placed This Encounter  Procedures  . Urinalysis, Routine w reflex microscopic  . Comprehensive metabolic panel  . CBC  . Discharge patient   Meds ordered this encounter  Medications  . promethazine (PHENERGAN) 25 mg in dextrose 5% lactated ringers 1,000 mL infusion  . promethazine (PHENERGAN) 25 MG tablet    Sig: Take 1 tablet (25 mg total) by mouth every 6 (six) hours as needed for nausea or vomiting.    Dispense:  30 tablet    Refill:  2    Order Specific Question:   Supervising Provider    Answer:   Tilda Burrow [[id number]]   Feeling much better after IV bolus and phenergan. Able to ambulate w/out dizziness.   MDM - N/V or pregnancy well-controlled w/ phenergan. Rx Phenergan tablets. - Orthostatic dizziness 2/2 N/V, dehydration, poor PO intake. Resolved w/ fluid bolus. Rx phenergan to enable pt to eat and drink adequately.   ASSESSMENT 1. Nausea and vomiting during pregnancy   2. Postural dizziness with near syncope     PLAN Discharge home  in stable condition. Syncope and hyperemesis precautions. Advance diet slowly. Encouraged small, frequent drinks and snacks. List of Ob/Gyn providers given.  Follow-up Information    [facility] Follow up.   Specialty:  Obstetrics and Gynecology Why:  start prenatal care Contact information: [address] [place] [address] [phone]       [facility] Follow up.   Specialty:  Obstetrics and Gynecology Why:  Start prenatal care Contact information: [address] [place] [address] [phone]         Allergies as of 12/09/2017      Reactions   Latex Hives   Other Anaphylaxis   walnuts      Medication List    TAKE these medications   albuterol 108 (90 Base) MCG/ACT inhaler Commonly known as:  PROVENTIL HFA;VENTOLIN HFA Inhale 1-2 puffs into the lungs every 6 (six) hours as needed for wheezing or shortness of breath.   fluticasone 50 MCG/ACT nasal spray Commonly known as:  FLONASE Place 1 spray into both nostrils daily as needed for allergies or rhinitis.   multivitamin with minerals tablet Take 1 tablet by mouth daily.   Prenatal Vitamins 0.8 MG tablet Take 1 tablet by mouth daily.   promethazine 25 MG tablet Commonly known as:  PHENERGAN  Take 1 tablet (25 mg total) by mouth every 6 (six) hours as needed for nausea or vomiting.        Katrinka Blazing, [place], [place] 12/09/2017  7:15 PM

## 2017-12-22 IMAGING — US US MFM OB FOLLOW-UP
1 series · 14 of 28 positions shown · non-contrast
Comparison: none

[Series 1: us mfm ob follow-up · 49 acquisitions, 14 frames shown]
[im 2/49]
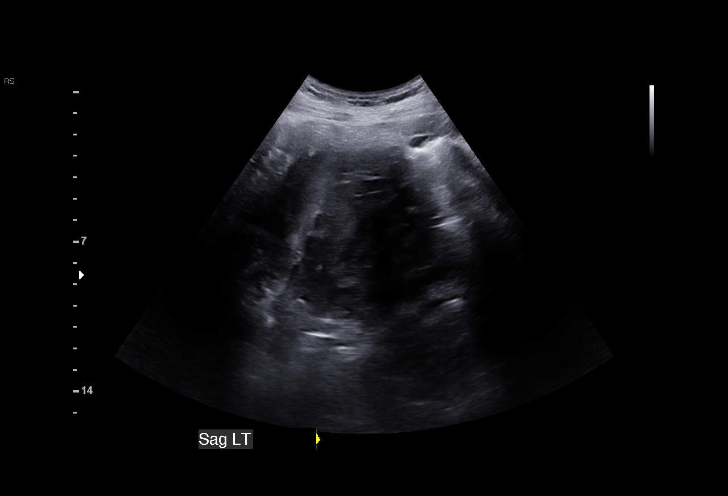
[im 6/49]
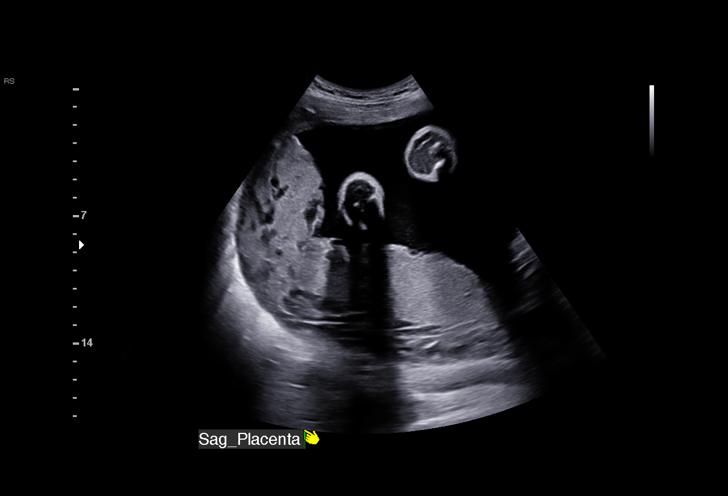
[im 9/49]
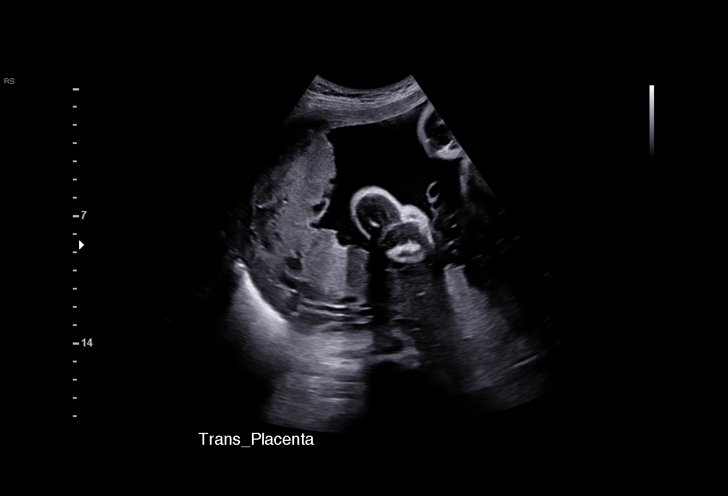
[im 13/49]
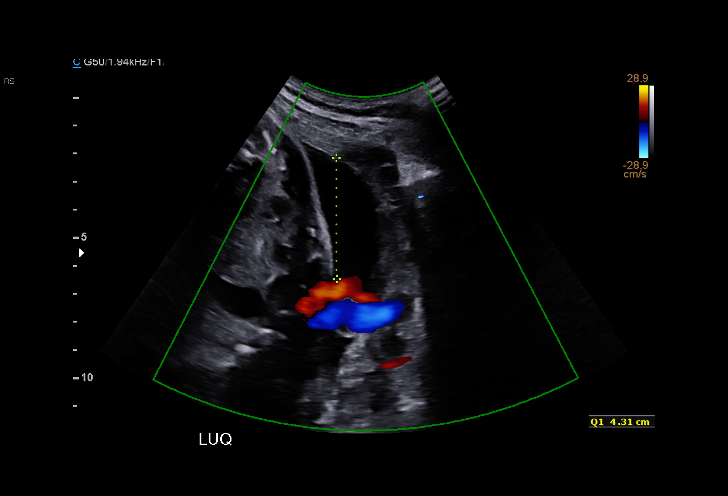
[im 17/49]
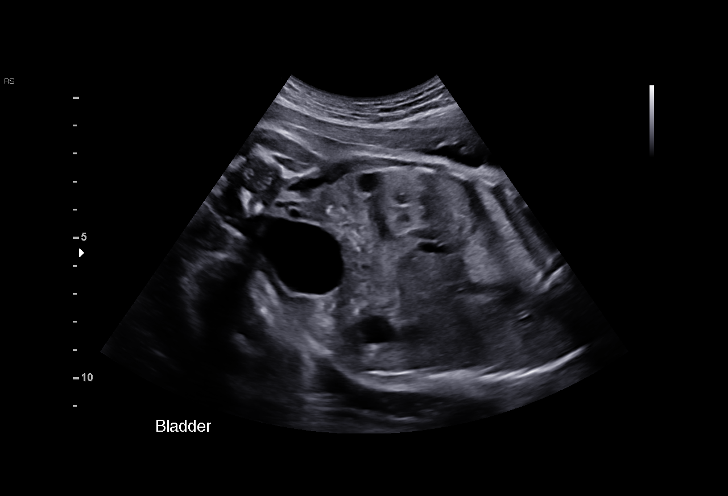
[im 20/49]
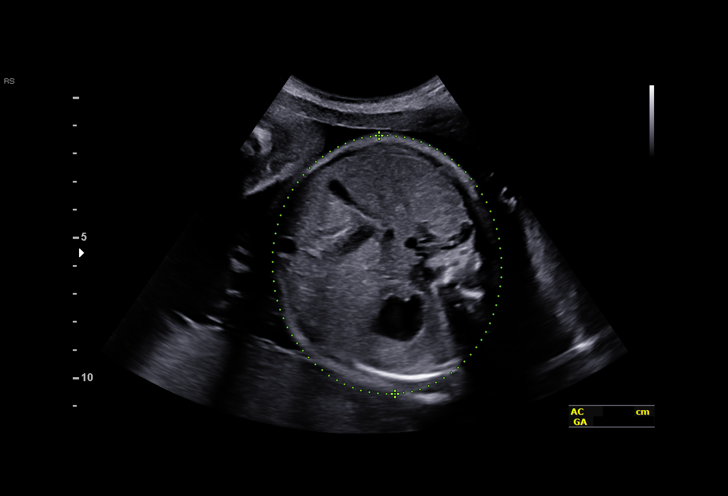
[im 24/49]
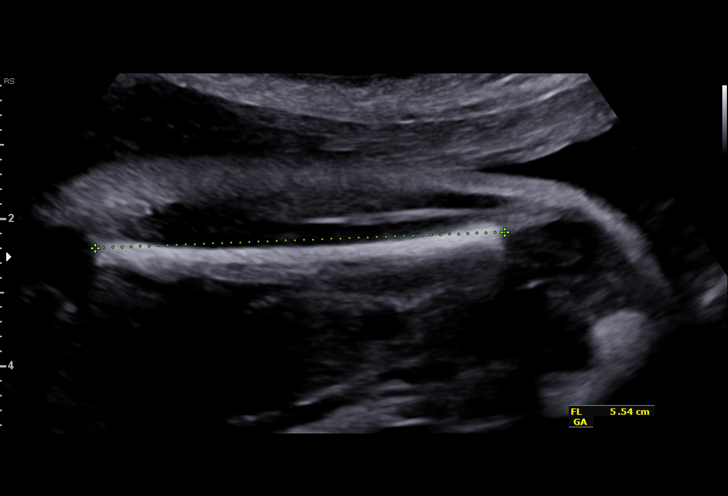
[im 27/49]
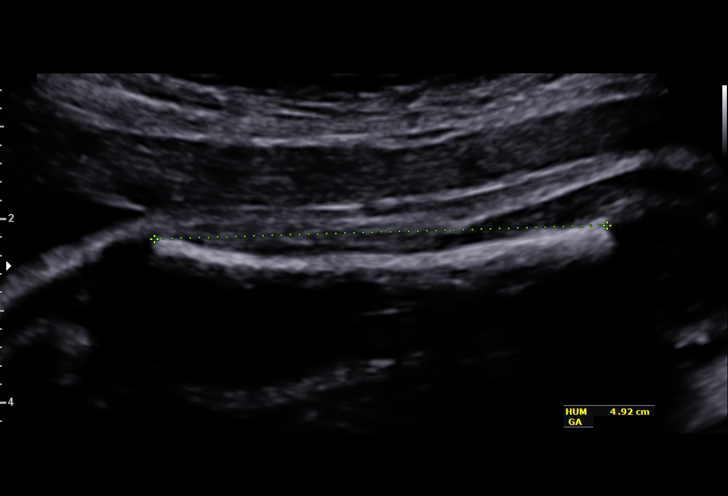
[im 31/49]
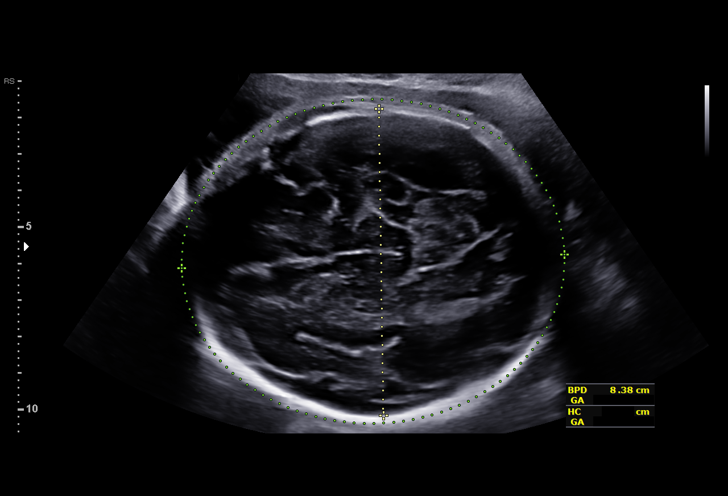
[im 34/49]
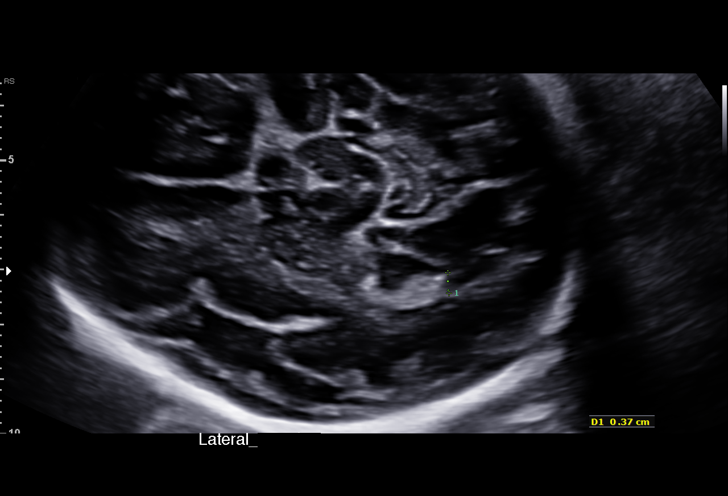
[im 38/49]
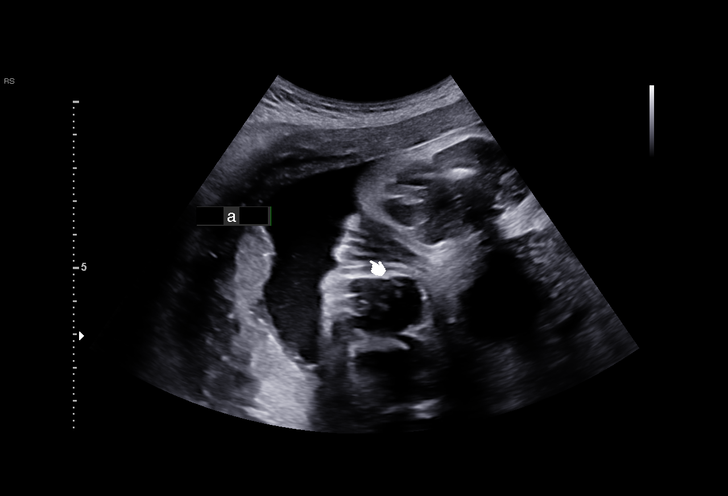
[im 41/49]
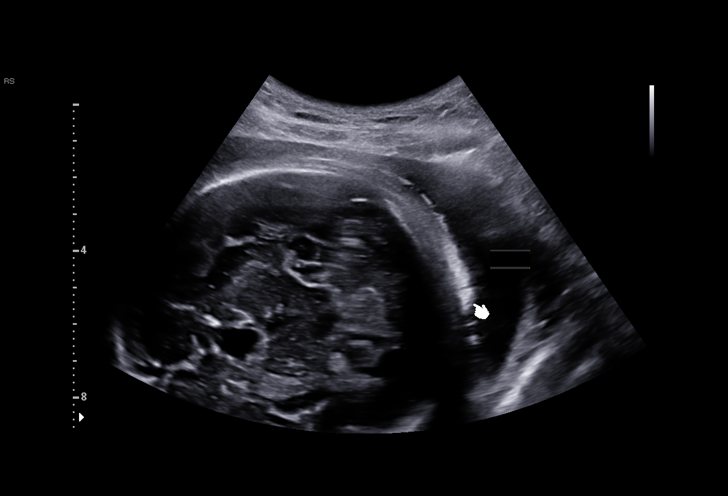
[im 45/49]
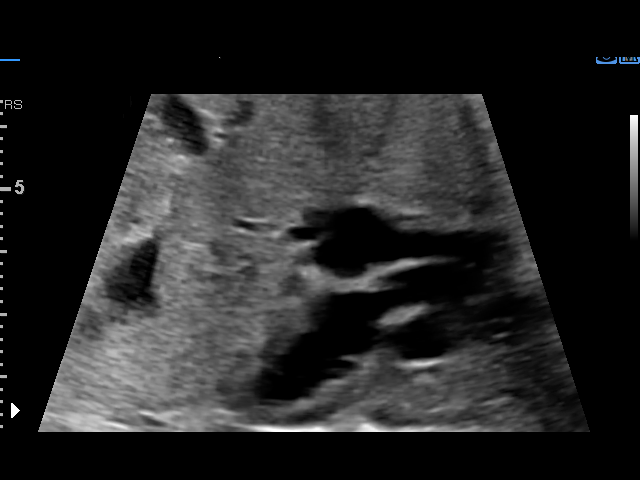
[im 49/49]
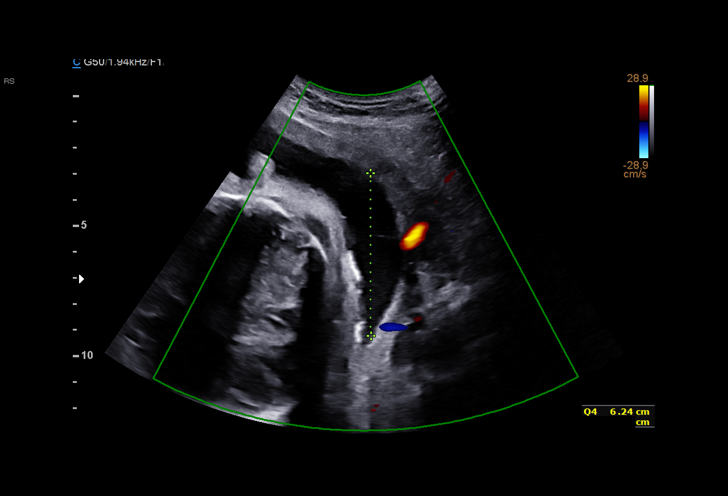

[14 of 28 positions shown; findings below may reference images not displayed]

OB/Gyn Clinic
Attending:        Nilda Ng      Secondary Phy.:   [facility]-
MAU/Triage

1  ZULEYKA MUSSER           [id number]      [phone]     [id number]
Indications

30 weeks gestation of pregnancy
Polyhydramnios, third trimester, antepartum
condition or complication, unspecified fetus
Encounter for other antenatal screening
follow-up
OB History

Gravidity:    1         Term:   0        Prem:   0        SAB:   0
TOP:          0       Ectopic:  0        Living: 0
Fetal Evaluation

Num Of Fetuses:     1
Fetal Heart         150
Rate(bpm):
Cardiac Activity:   Observed
Presentation:       Cephalic
Placenta:           Anterior, above cervical os
P. Cord Insertion:  Previously Visualized

Amniotic Fluid
AFI FV:      Polyhydramnios

AFI Sum(cm)     %Tile       Largest Pocket(cm)
27.12           > 97
RUQ(cm)       RLQ(cm)       LUQ(cm)        LLQ(cm)
7.55
Biometry

BPD:      83.6  mm     G. Age:  33w 4d         99  %    CI:        76.07   %    70 - 86
FL/HC:      18.3   %    19.2 -
HC:      303.8  mm     G. Age:  33w 5d         93  %    HC/AC:      1.10        0.99 -
AC:      275.1  mm     G. Age:  31w 4d         78  %    FL/BPD:     66.5   %    71 - 87
FL:       55.6  mm     G. Age:  29w 2d         10  %    FL/AC:      20.2   %    20 - 24
HUM:      49.7  mm     G. Age:  29w 1d         26  %

Est. FW:    7161  gm    3 lb 13 oz      70  %
Gestational Age

LMP:           30w 3d        Date:  07/22/16                 EDD:   04/28/17
U/S Today:     32w 0d                                        EDD:   04/17/17
Best:          30w 3d     Det. By:  LMP  (07/22/16)          EDD:   04/28/17
Anatomy

Cranium:               Appears normal         Aortic Arch:            Previously seen
Cavum:                 Previously seen        Ductal Arch:            Previously seen
Ventricles:            Appears normal         Diaphragm:              Previously seen
Choroid Plexus:        Previously seen        Stomach:                Appears normal, left
sided
Cerebellum:            Previously seen        Abdomen:                Appears normal
Posterior Fossa:       Previously seen        Abdominal Wall:         Previously seen
Nuchal Fold:           Not applicable (>20    Cord Vessels:           Previously seen
wks GA)
Face:                  Orbits and profile     Kidneys:                Appear normal
previously seen
Lips:                  Previously seen        Bladder:                Appears normal
Thoracic:              Appears normal         Spine:                  Previously seen
Heart:                 Previously seen        Upper Extremities:      Previously seen
RVOT:                  Previously seen        Lower Extremities:      Previously seen
LVOT:                  Appears normal

Other:  Heels previously visualized. 5th digit previously visualized. Nasal
bone previously visualized. Technically difficult due to fetal position.
Cervix Uterus Adnexa

Cervix
Not visualized (advanced GA >15wks)

Uterus
No abnormality visualized.

Left Ovary
Not visualized.

Right Ovary
Not visualized.

Adnexa:       No abnormality visualized. No adnexal mass
visualized.
Impression

Single living intrauterine pregnancy at 30 weeks 3 days.
Appropriate interval fetal growth (70%).
Normal amniotic fluid volume.
Normal interval fetal anatomy.
Recommendations

Recommend weekly BPPs starting in 2 weeks.

## 2018-01-02 ENCOUNTER — Encounter: Payer: Self-pay | Admitting: Family Medicine

## 2018-01-02 ENCOUNTER — Ambulatory Visit (INDEPENDENT_AMBULATORY_CARE_PROVIDER_SITE_OTHER): Payer: Federal, State, Local not specified - PPO

## 2018-01-02 ENCOUNTER — Ambulatory Visit: Payer: Federal, State, Local not specified - PPO | Admitting: Clinical

## 2018-01-02 VITALS — BP 104/62 | HR 81 | Wt 161.2 lb

## 2018-01-02 DIAGNOSIS — Z3401 Encounter for supervision of normal first pregnancy, first trimester: Secondary | ICD-10-CM | POA: Diagnosis not present

## 2018-01-02 DIAGNOSIS — Z3490 Encounter for supervision of normal pregnancy, unspecified, unspecified trimester: Secondary | ICD-10-CM | POA: Insufficient documentation

## 2018-01-02 DIAGNOSIS — Z3480 Encounter for supervision of other normal pregnancy, unspecified trimester: Secondary | ICD-10-CM

## 2018-01-02 DIAGNOSIS — O09891 Supervision of other high risk pregnancies, first trimester: Secondary | ICD-10-CM | POA: Insufficient documentation

## 2018-01-02 LAB — POCT URINALYSIS DIP (DEVICE)
Bilirubin Urine: NEGATIVE
Glucose, UA: NEGATIVE mg/dL
Hgb urine dipstick: NEGATIVE
Ketones, ur: 15 mg/dL — AB
Leukocytes, UA: NEGATIVE
Nitrite: NEGATIVE
Protein, ur: 30 mg/dL — AB
Specific Gravity, Urine: >=1.03 (ref 1.005–1.030)
Urobilinogen, UA: 1 mg/dL (ref 0.0–1.0)
pH: 6.5 (ref 5.0–8.0)

## 2018-01-02 MED ORDER — ONDANSETRON 8 MG PO TBDP: 8.0000 mg | ORAL_TABLET | Freq: Three times a day (TID) | ORAL | 1 refills | Status: DC | PRN

## 2018-01-02 NOTE — Progress Notes (Signed)
Subjective:   Andrea Foster is a 27 y.o. G2P1001 at [redacted]w[redacted]d by LMP being seen today for her first obstetrical visit.  Her obstetrical history is significant for gestational hypertension and polyhydramnios and has Short interval between pregnancies affecting pregnancy in first trimester, antepartum and Encounter for supervision of normal pregnancy on their problem list.. Patient does intend to breast feed. Pregnancy history fully reviewed.  Patient reports no complaints.  HISTORY: OB History  Gravida Para Term Preterm AB Living  2 1 1  0 0 1  SAB TAB Ectopic Multiple Live Births  0 0 0 0 1    # Outcome Date GA Lbr Len/2nd Weight Sex Delivery Anes PTL Lv  2 Current           1 Term 04/26/17 [redacted]w[redacted]d 13:32 / 00:27 8 lb 4.5 oz (3.756 kg) F Vag-Spont EPI  LIV     Birth Comments: caput     Name: Denton Brick     Apgar1: 8  Apgar5: 9   Past Medical History:  Diagnosis Date  . Asthma    seasonal as a child  . Gonorrhea   . Hx of chlamydia infection 2013   Past Surgical History:  Procedure Laterality Date  . NO PAST SURGERIES     Family History  Problem Relation Age of Onset  . Cancer Maternal Grandfather        ? agent orange  . Asthma Neg Hx   . Diabetes Neg Hx   . Heart disease Neg Hx   . Hypertension Neg Hx   . Stroke Neg Hx    Social History   Tobacco Use  . Smoking status: Never Smoker  . Smokeless tobacco: Never Used  Substance Use Topics  . Alcohol use: No    Alcohol/week: 1.0 standard drinks    Types: 1 Shots of liquor per week    Comment: rare, spicial occasions, not while preg  . Drug use: No   Allergies  Allergen Reactions  . Latex Hives  . Other Anaphylaxis    walnuts   Current Outpatient Medications on File Prior to Visit  Medication Sig Dispense Refill  . albuterol (PROVENTIL HFA;VENTOLIN HFA) 108 (90 Base) MCG/ACT inhaler Inhale 1-2 puffs into the lungs every 6 (six) hours as needed for wheezing or shortness of breath.    . Prenatal  Multivit-Min-Fe-FA (PRENATAL VITAMINS) 0.8 MG tablet Take 1 tablet by mouth daily. 30 tablet 2  . fluticasone (FLONASE) 50 MCG/ACT nasal spray Place 1 spray into both nostrils daily as needed for allergies or rhinitis.    . Multiple Vitamins-Minerals (MULTIVITAMIN WITH MINERALS) tablet Take 1 tablet by mouth daily.    . promethazine (PHENERGAN) 25 MG tablet Take 1 tablet (25 mg total) by mouth every 6 (six) hours as needed for nausea or vomiting. (Patient not taking: Reported on 01/02/2018) 30 tablet 2   No current facility-administered medications on file prior to visit.      Exam   Vitals:   01/02/18 1001  BP: 104/62  Pulse: 81  Weight: 161 lb 3.2 oz (73.1 kg)   Fetal Heart Rate (bpm): 165  Uterus:     Pelvic Exam: Perineum: no hemorrhoids, normal perineum   Vulva: normal external genitalia, no lesions   Vagina:  normal mucosa, normal discharge   Cervix: no lesions and normal, pap smear done.    Adnexa: normal adnexa and no mass, fullness, tenderness   Bony Pelvis: average  System: General: well-developed, well-nourished female in no  acute distress   Breast:  normal appearance, no masses or tenderness   Skin: normal coloration and turgor, no rashes   Neurologic: oriented, normal, negative, normal mood   Extremities: normal strength, tone, and muscle mass, ROM of all joints is normal   HEENT PERRLA, extraocular movement intact and sclera clear, anicteric   Mouth/Teeth mucous membranes moist, pharynx normal without lesions and dental hygiene good   Neck supple and no masses   Cardiovascular: regular rate and rhythm   Respiratory:  no respiratory distress, normal breath sounds   Abdomen: soft, non-tender; bowel sounds normal; no masses,  no organomegaly     Assessment:   Pregnancy: G2P1001 Patient Active Problem List   Diagnosis Date Noted  . Short interval between pregnancies affecting pregnancy in first trimester, antepartum 01/02/2018  . Encounter for supervision of  normal pregnancy 01/02/2018     Plan:  1. Encounter for supervision of normal pregnancy in first trimester - Culture, OB Urine - Cystic fibrosis gene test - Genetic Screening - Hemoglobinopathy Evaluation - Obstetric Panel, Including HIV - SMN1 COPY NUMBER ANALYSIS (SMA Carrier Screen) - [place] MFM OB COMP + 14 WK; Future - ondansetron (ZOFRAN ODT) 8 MG disintegrating tablet; Take 1 tablet (8 mg total) by mouth every 8 (eight) hours as needed for nausea or vomiting.  Dispense: 30 tablet; Refill: 1  2. Short interval between pregnancies affecting pregnancy in first trimester, antepartum -Other child 5 months old  Initial labs drawn. Continue prenatal vitamins. Genetic Screening discussed, NIPS: requested. Ultrasound discussed; fetal anatomic survey: requested. Problem list reviewed and updated. The nature of [facility] with multiple MDs and other Advanced Practice Providers was explained to patient; also emphasized that residents, students are part of our team. Routine obstetric precautions reviewed. Return in about 4 weeks (around 01/30/2018).   Rolm Bookbinder, CNM 01/02/18 10:30 AM

## 2018-01-02 NOTE — BH Specialist Note (Signed)
Integrated Behavioral Health Initial Visit  MRN: [id number] Name: Andrea Foster  Number of Integrated Behavioral Health Clinician visits:: 1/6 Session Start time: 10:36  Session End time: 10:44 Total time: 8 minutes  Type of Service: Integrated Behavioral Health- Individual/Family Interpretor:No. Interpretor Name and Language: n/a   Warm Hand Off Completed.       SUBJECTIVE: Andrea Foster is a 27 y.o. female accompanied by n/a Patient was referred by Cleone Slim for Initial OB introduction to integrated behavioral health services . Patient reports the following symptoms/concerns: Pt states no particular concern today. Duration of problem: n/a; Severity of problem: n/a  OBJECTIVE: Mood: Normal and Affect: Appropriate Risk of harm to self or others: No plan to harm self or others  LIFE CONTEXT: Family and Social: - School/Work: - Self-Care: - Life Changes: Current pregnancy  GOALS ADDRESSED: n/a INTERVENTIONS:  Standardized Assessments completed: GAD-7 and PHQ 9  ASSESSMENT: Patient currently experiencing Supervision of other normal pregnancy, first trimester.   Patient may benefit from Initial OB introduction to integrated behavioral health services .  PLAN: 1. Follow up with behavioral health clinician on : As needed 2. Behavioral recommendations:  -Continue taking prenatal vitamin, as recommended by medical provider 3. Referral(s): Integrated Behavioral Health Services (In Clinic) 4. "From scale of 1-10, how likely are you to follow plan?": 1-  Rae Lips, LCSW  Depression screen [facility] 2/9 01/02/2018 06/05/2017 04/04/2017 03/08/2017 03/02/2017  Decreased Interest 1 0 0 0 0  Down, Depressed, Hopeless 0 0 0 0 0  PHQ - 2 Score 1 0 0 0 0  Altered sleeping 0 0 0 0 0  Tired, decreased energy 1 0 0 0 0  Change in appetite 1 0 0 0 0  Feeling bad or failure about yourself  0 0 0 0 0  Trouble concentrating 0 0 - 0 0  Moving slowly or fidgety/restless 0 0 0 0 0   Suicidal thoughts 0 0 0 0 0  PHQ-9 Score 3 0 0 0 0   GAD 7 : Generalized Anxiety Score 01/02/2018 06/05/2017 04/04/2017 03/08/2017  Nervous, Anxious, on Edge 0 0 0 0  Control/stop worrying 0 0 0 0  Worry too much - different things 0 0 0 0  Trouble relaxing 0 0 0 0  Restless 0 0 0 0  Easily annoyed or irritable 1 0 0 1  Afraid - awful might happen 0 0 0 0  Total GAD 7 Score 1 0 0 1

## 2018-01-02 NOTE — Progress Notes (Signed)
Pt states needs something for nausea that does not make her so drowsy.

## 2018-01-02 NOTE — Patient Instructions (Addendum)
Safe Medications in Pregnancy  ° °Acne: °Benzoyl Peroxide °Salicylic Acid ° °Backache/Headache: °Tylenol: 2 regular strength every 4 hours OR °             2 Extra strength every 6 hours ° °Colds/Coughs/Allergies: °Benadryl (alcohol free) 25 mg every 6 hours as needed °Breath right strips °Claritin °Cepacol throat lozenges °Chloraseptic throat spray °Cold-Eeze- up to three times per day °Cough drops, alcohol free °Flonase (by prescription only) °Guaifenesin °Mucinex °Robitussin DM (plain only, alcohol free) °Saline nasal spray/drops °Sudafed (pseudoephedrine) & Actifed ** use only after [redacted] weeks gestation and if you do not have high blood pressure °Tylenol °Vicks Vaporub °Zinc lozenges °Zyrtec  ° °Constipation: °Colace °Ducolax suppositories °Fleet enema °Glycerin suppositories °Metamucil °Milk of magnesia °Miralax °Senokot °Smooth move tea ° °Diarrhea: °Kaopectate °Imodium A-D ° °*NO pepto Bismol ° °Hemorrhoids: °Anusol °Anusol HC °Preparation H °Tucks ° °Indigestion: °Tums °Maalox °Mylanta °Zantac  °Pepcid ° °Insomnia: °Benadryl (alcohol free) 25mg every 6 hours as needed °Tylenol PM °Unisom, no Gelcaps ° °Leg Cramps: °Tums °MagGel ° °Nausea/Vomiting:  °Bonine °Dramamine °Emetrol °Ginger extract °Sea bands °Meclizine  °Nausea medication to take during pregnancy:  °Unisom (doxylamine succinate 25 mg tablets) Take one tablet daily at bedtime. If symptoms are not adequately controlled, the dose can be increased to a maximum recommended dose of two tablets daily (1/2 tablet in the morning, 1/2 tablet mid-afternoon and one at bedtime). °Vitamin B6 100mg tablets. Take one tablet twice a day (up to 200 mg per day). ° °Skin Rashes: °Aveeno products °Benadryl cream or 25mg every 6 hours as needed °Calamine Lotion °1% cortisone cream ° °Yeast infection: °Gyne-lotrimin 7 °Monistat 7 ° ° °**If taking multiple medications, please check labels to avoid duplicating the same active ingredients °**take medication as directed on  the label °** Do not exceed 4000 mg of tylenol in 24 hours °**Do not take medications that contain aspirin or ibuprofen ° ° ° ° °First Trimester of Pregnancy °The first trimester of pregnancy is from week 1 until the end of week 13 (months 1 through 3). A week after a sperm fertilizes an egg, the egg will implant on the wall of the uterus. This embryo will begin to develop into a baby. Genes from you and your partner will form the baby. The female genes will determine whether the baby will be a boy or a girl. At 6-8 weeks, the eyes and face will be formed, and the heartbeat can be seen on ultrasound. At the end of 12 weeks, all the baby's organs will be formed. °Now that you are pregnant, you will want to do everything you can to have a healthy baby. Two of the most important things are to get good prenatal care and to follow your health care provider's instructions. Prenatal care is all the medical care you receive before the baby's birth. This care will help prevent, find, and treat any problems during the pregnancy and childbirth. °Body changes during your first trimester °Your body goes through many changes during pregnancy. The changes vary from woman to woman. °· You may gain or lose a couple of pounds at first. °· You may feel sick to your stomach (nauseous) and you may throw up (vomit). If the vomiting is uncontrollable, call your health care provider. °· You may tire easily. °· You may develop headaches that can be relieved by medicines. All medicines should be approved by your health care provider. °· You may urinate more often. Painful urination may mean you have   a bladder infection.  You may develop heartburn as a result of your pregnancy.  You may develop constipation because certain hormones are causing the muscles that push stool through your intestines to slow down.  You may develop hemorrhoids or swollen veins (varicose veins).  Your breasts may begin to grow larger and become tender. Your  nipples may stick out more, and the tissue that surrounds them (areola) may become darker.  Your gums may bleed and may be sensitive to brushing and flossing.  Dark spots or blotches (chloasma, mask of pregnancy) may develop on your face. This will likely fade after the baby is born.  Your menstrual periods will stop.  You may have a loss of appetite.  You may develop cravings for certain kinds of food.  You may have changes in your emotions from day to day, such as being excited to be pregnant or being concerned that something may go wrong with the pregnancy and baby.  You may have more vivid and strange dreams.  You may have changes in your hair. These can include thickening of your hair, rapid growth, and changes in texture. Some women also have hair loss during or after pregnancy, or hair that feels dry or thin. Your hair will most likely return to normal after your baby is born.  What to expect at prenatal visits During a routine prenatal visit:  You will be weighed to make sure you and the baby are growing normally.  Your blood pressure will be taken.  Your abdomen will be measured to track your baby's growth.  The fetal heartbeat will be listened to between weeks 10 and 14 of your pregnancy.  Test results from any previous visits will be discussed.  Your health care provider may ask you:  How you are feeling.  If you are feeling the baby move.  If you have had any abnormal symptoms, such as leaking fluid, bleeding, severe headaches, or abdominal cramping.  If you are using any tobacco products, including cigarettes, chewing tobacco, and electronic cigarettes.  If you have any questions.  Other tests that may be performed during your first trimester include:  Blood tests to find your blood type and to check for the presence of any previous infections. The tests will also be used to check for low iron levels (anemia) and protein on red blood cells (Rh antibodies).  Depending on your risk factors, or if you previously had diabetes during pregnancy, you may have tests to check for high blood sugar that affects pregnant women (gestational diabetes).  Urine tests to check for infections, diabetes, or protein in the urine.  An ultrasound to confirm the proper growth and development of the baby.  Fetal screens for spinal cord problems (spina bifida) and Down syndrome.  HIV (human immunodeficiency virus) testing. Routine prenatal testing includes screening for HIV, unless you choose not to have this test.  You may need other tests to make sure you and the baby are doing well.  Follow these instructions at home: Medicines  Follow your health care provider's instructions regarding medicine use. Specific medicines may be either safe or unsafe to take during pregnancy.  Take a prenatal vitamin that contains at least 600 micrograms (mcg) of folic acid.  If you develop constipation, try taking a stool softener if your health care provider approves. Eating and drinking  Eat a balanced diet that includes fresh fruits and vegetables, whole grains, good sources of protein such as meat, eggs, or tofu, and low-fat  dairy. Your health care provider will help you determine the amount of weight gain that is right for you.  Avoid raw meat and uncooked cheese. These carry germs that can cause birth defects in the baby.  Eating four or five small meals rather than three large meals a day may help relieve nausea and vomiting. If you start to feel nauseous, eating a few soda crackers can be helpful. Drinking liquids between meals, instead of during meals, also seems to help ease nausea and vomiting.  Limit foods that are high in fat and processed sugars, such as fried and sweet foods.  To prevent constipation: ? Eat foods that are high in fiber, such as fresh fruits and vegetables, whole grains, and beans. ? Drink enough fluid to keep your urine clear or pale  yellow. Activity  Exercise only as directed by your health care provider. Most women can continue their usual exercise routine during pregnancy. Try to exercise for 30 minutes at least 5 days a week. Exercising will help you: ? Control your weight. ? Stay in shape. ? Be prepared for labor and delivery.  Experiencing pain or cramping in the lower abdomen or lower back is a good sign that you should stop exercising. Check with your health care provider before continuing with normal exercises.  Try to avoid standing for long periods of time. Move your legs often if you must stand in one place for a long time.  Avoid heavy lifting.  Wear low-heeled shoes and practice good posture.  You may continue to have sex unless your health care provider tells you not to. Relieving pain and discomfort  Wear a good support bra to relieve breast tenderness.  Take warm sitz baths to soothe any pain or discomfort caused by hemorrhoids. Use hemorrhoid cream if your health care provider approves.  Rest with your legs elevated if you have leg cramps or low back pain.  If you develop varicose veins in your legs, wear support hose. Elevate your feet for 15 minutes, 3-4 times a day. Limit salt in your diet. Prenatal care  Schedule your prenatal visits by the twelfth week of pregnancy. They are usually scheduled monthly at first, then more often in the last 2 months before delivery.  Write down your questions. Take them to your prenatal visits.  Keep all your prenatal visits as told by your health care provider. This is important. Safety  Wear your seat belt at all times when driving.  Make a list of emergency phone numbers, including numbers for family, friends, the hospital, and police and fire departments. General instructions  Ask your health care provider for a referral to a local prenatal education class. Begin classes no later than the beginning of month 6 of your pregnancy.  Ask for help if  you have counseling or nutritional needs during pregnancy. Your health care provider can offer advice or refer you to specialists for help with various needs.  Do not use hot tubs, steam rooms, or saunas.  Do not douche or use tampons or scented sanitary pads.  Do not cross your legs for long periods of time.  Avoid cat litter boxes and soil used by cats. These carry germs that can cause birth defects in the baby and possibly loss of the fetus by miscarriage or stillbirth.  Avoid all smoking, herbs, alcohol, and medicines not prescribed by your health care provider. Chemicals in these products affect the formation and growth of the baby.  Do not use any products  that contain nicotine or tobacco, such as cigarettes and e-cigarettes. If you need help quitting, ask your health care provider. You may receive counseling support and other resources to help you quit.  Schedule a dentist appointment. At home, brush your teeth with a soft toothbrush and be gentle when you floss. Contact a health care provider if:  You have dizziness.  You have mild pelvic cramps, pelvic pressure, or nagging pain in the abdominal area.  You have persistent nausea, vomiting, or diarrhea.  You have a bad smelling vaginal discharge.  You have pain when you urinate.  You notice increased swelling in your face, hands, legs, or ankles.  You are exposed to fifth disease or chickenpox.  You are exposed to [place] measles (rubella) and have never had it. Get help right away if:  You have a fever.  You are leaking fluid from your vagina.  You have spotting or bleeding from your vagina.  You have severe abdominal cramping or pain.  You have rapid weight gain or loss.  You vomit blood or material that looks like coffee grounds.  You develop a severe headache.  You have shortness of breath.  You have any kind of trauma, such as from a fall or a car accident. Summary  The first trimester of pregnancy is  from week 1 until the end of week 13 (months 1 through 3).  Your body goes through many changes during pregnancy. The changes vary from woman to woman.  You will have routine prenatal visits. During those visits, your health care provider will examine you, discuss any test results you may have, and talk with you about how you are feeling. This information is not intended to replace advice given to you by your health care provider. Make sure you discuss any questions you have with your health care provider. Document Released: 04/11/2001 Document Revised: 03/29/2016 Document Reviewed: 03/29/2016 Elsevier Interactive Patient Education  2018 [employer].  Morning Sickness Morning sickness is when you feel sick to your stomach (nauseous) during pregnancy. You may feel sick to your stomach and throw up (vomit). You may feel sick in the morning, but you can feel this way any time of day. Some women feel very sick to their stomach and cannot stop throwing up (hyperemesis gravidarum). Follow these instructions at home:  Only take medicines as told by your doctor.  Take multivitamins as told by your doctor. Taking multivitamins before getting pregnant can stop or lessen the harshness of morning sickness.  Eat dry toast or unsalted crackers before getting out of bed.  Eat 5 to 6 small meals a day.  Eat dry and bland foods like rice and baked potatoes.  Do not drink liquids with meals. Drink between meals.  Do not eat greasy, fatty, or spicy foods.  Have someone cook for you if the smell of food causes you to feel sick or throw up.  If you feel sick to your stomach after taking prenatal vitamins, take them at night or with a snack.  Eat protein when you need a snack (nuts, yogurt, cheese).  Eat unsweetened gelatins for dessert.  Wear a bracelet used for sea sickness (acupressure wristband).  Go to a doctor that puts thin needles into certain body points (acupuncture) to improve how you  feel.  Do not smoke.  Use a humidifier to keep the air in your house free of odors.  Get lots of fresh air. Contact a doctor if:  You need medicine to feel better.  You feel dizzy or lightheaded.  You are losing weight. Get help right away if:  You feel very sick to your stomach and cannot stop throwing up.  You pass out (faint). This information is not intended to replace advice given to you by your health care provider. Make sure you discuss any questions you have with your health care provider. Document Released: 05/25/2004 Document Revised: 09/23/2015 Document Reviewed: 10/02/2012 Elsevier Interactive Patient Education  2017 [employer]. Places to have your son circumcised:    [facility] [phone] while you are in hospital  [facility] [phone] $244 by 4 wks  [facility] [phone] $175 by 2 wks  [facility] [phone] $250 by 7 days [facility] [phone] $269 by 4 wks  These prices sometimes change but are roughly what you can expect to pay. Please call and confirm pricing.   Circumcision is considered an elective/non-medically necessary procedure. There are many reasons parents decide to have their sons circumsized. During the first year of life circumcised males have a reduced risk of urinary tract infections but after this year the rates between circumcised males and uncircumcised males are the same.  It is safe to have your son circumcised outside of the hospital and the places above perform them regularly.   Deciding about Circumcision in Baby Boys  (Up-to-date The Basics)  What is circumcision?  Circumcision is a surgery that removes the skin that covers the tip of the penis, called the "foreskin" Circumcision is usually done when a boy is between 32 and 32 days old. In the [place],  circumcision is common. In some other countries, fewer boys are circumcised. Circumcision is a common tradition in some religions.  Should I have my baby boy circumcised?  There is no easy answer. Circumcision has some benefits. But it also has risks. After talking with your doctor, you will have to decide for yourself what is right for your family.  What are the benefits of circumcision?  Circumcised boys seem to have slightly lower rates of: ?Urinary tract infections ?Swelling of the opening at the tip of the penis Circumcised men seem to have slightly lower rates of: ?Urinary tract infections ?Swelling of the opening at the tip of the penis ?Penis cancer ?HIV and other infections that you catch during sex ?Cervical cancer in the women they have sex with Even so, in the [place], the risks of these problems are small - even in boys and men who have not been circumcised. Plus, boys and men who are not circumcised can reduce these extra risks by: ?Cleaning their penis well ?Using condoms during sex  What are the risks of circumcision?  Risks include: ?Bleeding or infection from the surgery ?Damage to or amputation of the penis ?A chance that the doctor will cut off too much or not enough of the foreskin ?A chance that sex won't feel as good later in life Only about 1 out of every 200 circumcisions leads to problems. There is also a chance that your health insurance won't pay for circumcision.  How is circumcision done in baby boys?  First, the baby gets medicine for pain relief. This might be a cream on the skin or a shot into the base of the penis. Next, the doctor cleans the baby's penis well. Then he or she uses special tools to cut off the foreskin. Finally, the doctor wraps a bandage (called gauze) around the baby's penis. If you have your baby circumcised, his doctor or nurse will give you  instructions on how to care for him after the surgery. It is important that you  follow those instructions carefully.

## 2018-01-04 DIAGNOSIS — S0035XA Superficial foreign body of nose, initial encounter: Secondary | ICD-10-CM | POA: Diagnosis not present

## 2018-01-04 LAB — CULTURE, OB URINE

## 2018-01-04 LAB — URINE CULTURE, OB REFLEX

## 2018-01-07 ENCOUNTER — Encounter: Payer: Self-pay | Admitting: *Deleted

## 2018-01-10 ENCOUNTER — Telehealth: Payer: Self-pay | Admitting: General Practice

## 2018-01-10 LAB — CYSTIC FIBROSIS GENE TEST

## 2018-01-10 LAB — OBSTETRIC PANEL, INCLUDING HIV
Antibody Screen: NEGATIVE
Basophils Absolute: 0 10*3/uL (ref 0.0–0.2)
Basos: 0 %
EOS (ABSOLUTE): 0.2 10*3/uL (ref 0.0–0.4)
Eos: 4 %
HIV Screen 4th Generation wRfx: NONREACTIVE
Hematocrit: 32.2 % — ABNORMAL LOW (ref 34.0–46.6)
Hemoglobin: 10.6 g/dL — ABNORMAL LOW (ref 11.1–15.9)
Hepatitis B Surface Ag: NEGATIVE
Immature Grans (Abs): 0 10*3/uL (ref 0.0–0.1)
Immature Granulocytes: 0 %
Lymphocytes Absolute: 1.7 10*3/uL (ref 0.7–3.1)
Lymphs: 29 %
MCH: 27.8 pg (ref 26.6–33.0)
MCHC: 32.9 g/dL (ref 31.5–35.7)
MCV: 85 fL (ref 79–97)
Monocytes Absolute: 0.4 10*3/uL (ref 0.1–0.9)
Monocytes: 7 %
Neutrophils Absolute: 3.7 10*3/uL (ref 1.4–7.0)
Neutrophils: 60 %
Platelets: 257 10*3/uL (ref 150–450)
RBC: 3.81 x10E6/uL (ref 3.77–5.28)
RDW: 14.5 % (ref 12.3–15.4)
RPR Ser Ql: NONREACTIVE
Rh Factor: POSITIVE
Rubella Antibodies, IGG: 2.61 index (ref >0.99)
WBC: 6 10*3/uL (ref 3.4–10.8)

## 2018-01-10 LAB — SMN1 COPY NUMBER ANALYSIS (SMA CARRIER SCREENING)

## 2018-01-10 LAB — HEMOGLOBINOPATHY EVALUATION
Ferritin: 22 ng/mL (ref 15–150)
Hgb A2 Quant: 2.4 % (ref 1.8–3.2)
Hgb A: 97.6 % (ref 96.4–98.8)
Hgb C: 0 %
Hgb F Quant: 0 % (ref 0.0–2.0)
Hgb S: 0 %
Hgb Solubility: NEGATIVE
Hgb Variant: 0 %

## 2018-01-10 NOTE — Telephone Encounter (Signed)
Patient called and left message on nurse voicemail line requesting test results.

## 2018-01-11 MED ORDER — FERROUS SULFATE 325 (65 FE) MG PO TABS
325.0000 mg | ORAL_TABLET | Freq: Every day | ORAL | 0 refills | Status: DC
Start: 2018-01-11 — End: 2018-01-19

## 2018-01-11 NOTE — Addendum Note (Signed)
Addended by: Rolm BookbinderNEILL, CAROLINE M on: 01/11/2018 09:57 AM   Modules accepted: Orders

## 2018-01-14 NOTE — Telephone Encounter (Signed)
Called pt and pt informed me that all her concerns have been addressed.  Pt did not have any other questions.

## 2018-01-19 ENCOUNTER — Inpatient Hospital Stay (HOSPITAL_COMMUNITY)
Admission: AD | Admit: 2018-01-19 | Discharge: 2018-01-19 | Disposition: A | Discharge status: Home / Self Care | Payer: Federal, State, Local not specified - PPO | Source: Ambulatory Visit | Attending: Obstetrics & Gynecology | Admitting: Obstetrics & Gynecology

## 2018-01-19 ENCOUNTER — Encounter (HOSPITAL_COMMUNITY): Payer: Self-pay | Admitting: *Deleted

## 2018-01-19 DIAGNOSIS — K5900 Constipation, unspecified: Secondary | ICD-10-CM | POA: Diagnosis not present

## 2018-01-19 DIAGNOSIS — M545 Low back pain: Secondary | ICD-10-CM | POA: Insufficient documentation

## 2018-01-19 DIAGNOSIS — Z3A13 13 weeks gestation of pregnancy: Secondary | ICD-10-CM | POA: Diagnosis not present

## 2018-01-19 DIAGNOSIS — O26899 Other specified pregnancy related conditions, unspecified trimester: Secondary | ICD-10-CM

## 2018-01-19 DIAGNOSIS — O2342 Unspecified infection of urinary tract in pregnancy, second trimester: Secondary | ICD-10-CM

## 2018-01-19 DIAGNOSIS — K429 Umbilical hernia without obstruction or gangrene: Secondary | ICD-10-CM

## 2018-01-19 DIAGNOSIS — R1030 Lower abdominal pain, unspecified: Secondary | ICD-10-CM | POA: Diagnosis not present

## 2018-01-19 DIAGNOSIS — R102 Pelvic and perineal pain: Secondary | ICD-10-CM

## 2018-01-19 DIAGNOSIS — O2341 Unspecified infection of urinary tract in pregnancy, first trimester: Secondary | ICD-10-CM | POA: Diagnosis not present

## 2018-01-19 DIAGNOSIS — O99612 Diseases of the digestive system complicating pregnancy, second trimester: Secondary | ICD-10-CM

## 2018-01-19 LAB — WET PREP, GENITAL
Sperm: NONE SEEN
Trich, Wet Prep: NONE SEEN
Yeast Wet Prep HPF POC: NONE SEEN

## 2018-01-19 LAB — URINALYSIS, ROUTINE W REFLEX MICROSCOPIC
Bilirubin Urine: NEGATIVE
Glucose, UA: NEGATIVE mg/dL
Hgb urine dipstick: NEGATIVE
Ketones, ur: 5 mg/dL — AB
Nitrite: NEGATIVE
Protein, ur: 30 mg/dL — AB
Specific Gravity, Urine: 1.028 (ref 1.005–1.030)
pH: 5 (ref 5.0–8.0)

## 2018-01-19 MED ORDER — CEPHALEXIN 500 MG PO CAPS: 500.0000 mg | ORAL_CAPSULE | Freq: Three times a day (TID) | ORAL | 0 refills | Status: AC

## 2018-01-19 MED ORDER — COMFORT FIT MATERNITY SUPP MED MISC: 1.0000 | Freq: Every day | 0 refills | Status: DC

## 2018-01-19 MED ORDER — DOCUSATE SODIUM 100 MG PO CAPS: 100.0000 mg | ORAL_CAPSULE | Freq: Two times a day (BID) | ORAL | 2 refills | Status: DC | PRN

## 2018-01-19 MED ORDER — CONCEPT OB 130-92.4-1 MG PO CAPS: 1.0000 | ORAL_CAPSULE | Freq: Every day | ORAL | 11 refills | Status: DC

## 2018-01-19 NOTE — Discharge Instructions (Signed)

## 2018-01-19 NOTE — MAU Provider Note (Signed)
Chief Complaint: Abdominal Pain   None     SUBJECTIVE HPI: Andrea Foster is a 27 y.o. G2P1001 at 176w1d by LMP who presents to maternity admissions reporting sharp bilateral lower abdominal pain with movement, low back pain, and pain and a knot at her umbilicus. She reports the pain started yesterday. Rest makes it better but does not resolve the pain.  Her back pain is intermittent but lasts a few hours then improves.  Her lower abdominal pain occurs when standing up from a chair or getting in or out of bed. It also happens at night when she is changing positions in bed.  She has not tried any treatments. There are no other associated symptoms.   She denies vaginal bleeding, vaginal itching/burning, urinary symptoms, h/a, dizziness, n/v, or fever/chills.     HPI  Past Medical History:  Diagnosis Date  . Asthma    seasonal as a child  . Gonorrhea   . Hx of chlamydia infection 2013   Past Surgical History:  Procedure Laterality Date  . NO PAST SURGERIES     Social History   Socioeconomic History  . Marital status: Single    Spouse name: Not on file  . Number of children: Not on file  . Years of education: Not on file  . Highest education level: Not on file  Occupational History  . Not on file  Social Needs  . Financial resource strain: Not on file  . Food insecurity:    Worry: Not on file    Inability: Not on file  . Transportation needs:    Medical: Not on file    Non-medical: Not on file  Tobacco Use  . Smoking status: Never Smoker  . Smokeless tobacco: Never Used  Substance and Sexual Activity  . Alcohol use: No    Alcohol/week: 1.0 standard drinks    Types: 1 Shots of liquor per week    Comment: rare, spicial occasions, not while preg  . Drug use: No  . Sexual activity: Yes    Birth control/protection: None  Lifestyle  . Physical activity:    Days per week: Not on file    Minutes per session: Not on file  . Stress: Not on file  Relationships  . Social  connections:    Talks on phone: Not on file    Gets together: Not on file    Attends religious service: Not on file    Active member of club or organization: Not on file    Attends meetings of clubs or organizations: Not on file    Relationship status: Not on file  . Intimate partner violence:    Fear of current or ex partner: Not on file    Emotionally abused: Not on file    Physically abused: Not on file    Forced sexual activity: Not on file  Other Topics Concern  . Not on file  Social History Narrative  . Not on file   No current facility-administered medications on file prior to encounter.    Current Outpatient Medications on File Prior to Encounter  Medication Sig Dispense Refill  . albuterol (PROVENTIL HFA;VENTOLIN HFA) 108 (90 Base) MCG/ACT inhaler Inhale 1-2 puffs into the lungs every 6 (six) hours as needed for wheezing or shortness of breath.    . fluticasone (FLONASE) 50 MCG/ACT nasal spray Place 1 spray into both nostrils daily as needed for allergies or rhinitis.    Marland Kitchen. ondansetron (ZOFRAN ODT) 8 MG disintegrating tablet Take 1  tablet (8 mg total) by mouth every 8 (eight) hours as needed for nausea or vomiting. 30 tablet 1  . promethazine (PHENERGAN) 25 MG tablet Take 1 tablet (25 mg total) by mouth every 6 (six) hours as needed for nausea or vomiting. (Patient not taking: Reported on 01/02/2018) 30 tablet 2   Allergies  Allergen Reactions  . Latex Hives  . Other Anaphylaxis    walnuts    ROS:  Review of Systems  Constitutional: Negative for chills, fatigue and fever.  Respiratory: Negative for shortness of breath.   Cardiovascular: Negative for chest pain.  Gastrointestinal: Positive for abdominal pain.  Genitourinary: Positive for pelvic pain. Negative for difficulty urinating, dysuria, flank pain, vaginal bleeding, vaginal discharge and vaginal pain.  Musculoskeletal: Positive for back pain.  Neurological: Negative for dizziness and headaches.   Psychiatric/Behavioral: Negative.      I have reviewed patient's Past Medical Hx, Surgical Hx, Family Hx, Social Hx, medications and allergies.   Physical Exam   Patient Vitals for the past 24 hrs:  BP Temp Temp src Pulse Resp SpO2 Height Weight  01/19/18 2303 108/63 97.9 F (36.6 C) Oral 71 18 - - -  01/19/18 2124 103/68 98 F (36.7 C) Oral (!) 123 18 100 % - -  01/19/18 2120 - - - - - - 5\' 3"  (1.6 m) 72.1 kg   Constitutional: Well-developed, well-nourished female in no acute distress.  Cardiovascular: normal rate Respiratory: normal effort GI: Abd soft, mild tenderness above umbilicus, small 1 x 1 cm raised soft area above umbilicus when pt sits up, tenderness in bilateral low abdomen/inguinal areas, no rebound tenderness or guarding, pos BS x 4 MS: Extremities nontender, no edema, normal ROM Neurologic: Alert and oriented x 4.  GU: Neg CVAT.  PELVIC EXAM: Cervix pink, visually closed, without lesion, scant white creamy discharge, vaginal walls and external genitalia normal Bimanual exam: Cervix 0/long/high, firm, anterior, neg CMT, uterus nontender,~ 13 week size, adnexa without tenderness, enlargement, or mass  FHT 156 by doppler  LAB RESULTS Results for orders placed or performed during the hospital encounter of 01/19/18 (from the past 24 hour(s))  Urinalysis, Routine w reflex microscopic     Status: Abnormal   Collection Time: 01/19/18  9:15 PM  Result Value Ref Range   Color, Urine AMBER (A) YELLOW   APPearance HAZY (A) CLEAR   Specific Gravity, Urine 1.028 1.005 - 1.030   pH 5.0 5.0 - 8.0   Glucose, UA NEGATIVE NEGATIVE mg/dL   Hgb urine dipstick NEGATIVE NEGATIVE   Bilirubin Urine NEGATIVE NEGATIVE   Ketones, ur 5 (A) NEGATIVE mg/dL   Protein, ur 30 (A) NEGATIVE mg/dL   Nitrite NEGATIVE NEGATIVE   Leukocytes, UA MODERATE (A) NEGATIVE   RBC / HPF 0-5 0 - 5 RBC/hpf   WBC, UA 21-50 0 - 5 WBC/hpf   Bacteria, UA RARE (A) NONE SEEN   Squamous Epithelial / LPF 6-10  0 - 5   Mucus PRESENT   Wet prep, genital     Status: Abnormal   Collection Time: 01/19/18  9:47 PM  Result Value Ref Range   Yeast Wet Prep HPF POC NONE SEEN NONE SEEN   Trich, Wet Prep NONE SEEN NONE SEEN   Clue Cells Wet Prep HPF POC PRESENT (A) NONE SEEN   WBC, Wet Prep HPF POC MANY (A) NONE SEEN   Sperm NONE SEEN     O/Positive/-- (09/04 1031)  IMAGING No results found.  MAU Management/MDM: UA, wet prep,  GC Evidence of mild abdominal hernia above umbilicus, not palpated or visible at rest but palpable when pt sits up.  No evidence of incarceration.  Pain in inguinal areas with movement c/w round ligament pain.   Treat constipation with Colace BID UA with possible UTI, will treat with Keflex TID Rest/ice/heat/warm bath/Tylenol/pregnancy support belt for pain Increase PO fluids  Pt discharged with strict return precautions.  ASSESSMENT 1. UTI (urinary tract infection) during pregnancy, second trimester   2. Constipation during pregnancy in second trimester   3. Pain of round ligament affecting pregnancy, antepartum   4. Umbilical hernia without obstruction and without gangrene     PLAN Discharge home Allergies as of 01/19/2018      Reactions   Latex Hives   Other Anaphylaxis   walnuts      Medication List    STOP taking these medications   ferrous sulfate 325 (65 FE) MG tablet   multivitamin with minerals tablet   Prenatal Vitamins 0.8 MG tablet     TAKE these medications   albuterol 108 (90 Base) MCG/ACT inhaler Commonly known as:  PROVENTIL HFA;VENTOLIN HFA Inhale 1-2 puffs into the lungs every 6 (six) hours as needed for wheezing or shortness of breath.   cephALEXin 500 MG capsule Commonly known as:  KEFLEX Take 1 capsule (500 mg total) by mouth 3 (three) times daily for 7 days.   COMFORT FIT MATERNITY SUPP MED Misc 1 Device by Does not apply route daily.   CONCEPT OB 130-92.4-1 MG Caps Take 1 capsule by mouth daily.   docusate sodium 100 MG  capsule Commonly known as:  COLACE Take 1 capsule (100 mg total) by mouth 2 (two) times daily as needed.   fluticasone 50 MCG/ACT nasal spray Commonly known as:  FLONASE Place 1 spray into both nostrils daily as needed for allergies or rhinitis.   ondansetron 8 MG disintegrating tablet Commonly known as:  ZOFRAN-ODT Take 1 tablet (8 mg total) by mouth every 8 (eight) hours as needed for nausea or vomiting.   promethazine 25 MG tablet Commonly known as:  PHENERGAN Take 1 tablet (25 mg total) by mouth every 6 (six) hours as needed for nausea or vomiting.        Sharen Counter Certified Nurse-Midwife 01/20/2018  5:03 AM

## 2018-01-19 NOTE — MAU Note (Signed)
Lower abdominal and back pain since yesterday. Also  "there is a knot on top of my belly button"

## 2018-01-21 LAB — CULTURE, OB URINE: Culture: NO GROWTH

## 2018-01-21 LAB — GC/CHLAMYDIA PROBE AMP (~~LOC~~) NOT AT ARMC
Chlamydia: NEGATIVE
Neisseria Gonorrhea: NEGATIVE

## 2018-01-30 ENCOUNTER — Ambulatory Visit (INDEPENDENT_AMBULATORY_CARE_PROVIDER_SITE_OTHER): Payer: Federal, State, Local not specified - PPO | Admitting: Nurse Practitioner

## 2018-01-30 VITALS — BP 102/68 | HR 90 | Wt 157.9 lb

## 2018-01-30 DIAGNOSIS — Z3480 Encounter for supervision of other normal pregnancy, unspecified trimester: Secondary | ICD-10-CM

## 2018-01-30 DIAGNOSIS — Z8759 Personal history of other complications of pregnancy, childbirth and the puerperium: Secondary | ICD-10-CM | POA: Insufficient documentation

## 2018-01-30 MED ORDER — ASPIRIN EC 81 MG PO TBEC: 81.0000 mg | DELAYED_RELEASE_TABLET | Freq: Every day | ORAL | 5 refills | Status: DC

## 2018-01-30 NOTE — Progress Notes (Signed)
    Subjective:  Andrea Foster is a 27 y.o. G2P1001 at [redacted]w[redacted]d being seen today for ongoing prenatal care.  She is currently monitored for the following issues for this low-risk pregnancy and has Short interval between pregnancies affecting pregnancy in first trimester, antepartum and Encounter for supervision of normal pregnancy on their problem list.  Patient reports no complaints.  Contractions: Not present. Vag. Bleeding: None.  Movement: Present. Denies leaking of fluid.   The following portions of the patient's history were reviewed and updated as appropriate: allergies, current medications, past family history, past medical history, past social history, past surgical history and problem list. Problem list updated.  Objective:   Vitals:   01/30/18 1106  BP: 102/68  Pulse: 90  Weight: 157 lb 14.4 oz (71.6 kg)    Fetal Status: Fetal Heart Rate (bpm): 147 Fundal Height: 17 cm Movement: Present     General:  Alert, oriented and cooperative. Patient is in no acute distress.  Skin: Skin is warm and dry. No rash noted.   Cardiovascular: Normal heart rate noted  Respiratory: Normal respiratory effort, no problems with respiration noted  Abdomen: Soft, gravid, appropriate for gestational age. Pain/Pressure: Absent     Pelvic:  Cervical exam deferred        Extremities: Normal range of motion.  Edema: None  Mental Status: Normal mood and affect. Normal behavior. Normal judgment and thought content.   Urinalysis:      Assessment and Plan:  Pregnancy: G2P1001 at [redacted]w[redacted]d  1. Supervision of other normal pregnancy, antepartum Doing well - reviewed BTL with her and will sign papers at 28 weeks.  Advised to consider alternative contraception if MD is not in agreement to perform BTL. Has ultrasound scheduled for 19 weeks.  2.  History of gestational hypertension Low dose aspirin - reviewed with Dr. Earlene Plater  Preterm labor symptoms and general obstetric precautions including but not limited to  vaginal bleeding, contractions, leaking of fluid and fetal movement were reviewed in detail with the patient. Please refer to After Visit Summary for other counseling recommendations.  Return in about 4 weeks (around 02/27/2018).  Nolene Bernheim, RN, MSN, NP-BC Nurse Practitioner, [facility] for [employer], [facility] 01/30/2018 11:40 AM

## 2018-01-30 NOTE — Progress Notes (Signed)
Medicaid Home Form Completed.  

## 2018-01-30 NOTE — Patient Instructions (Addendum)
Low dose aspirin - 81 mg one daily by mouth  Genetic Screening Results Information: To get your results, you need Internet access to a web browser to search [email or web address]/MyChart (the direct app on your phone will not give you these results).  Then select Lab Scanned and click on the blue hyper link that says View Image to see your Panorama results.  You can also use the directions on the purple card given to look up your results directly on the [facility] website.

## 2018-02-03 ENCOUNTER — Other Ambulatory Visit: Payer: Self-pay

## 2018-02-03 DIAGNOSIS — Z3401 Encounter for supervision of normal first pregnancy, first trimester: Secondary | ICD-10-CM

## 2018-02-07 ENCOUNTER — Encounter (HOSPITAL_COMMUNITY): Payer: Self-pay | Admitting: *Deleted

## 2018-02-07 ENCOUNTER — Inpatient Hospital Stay (HOSPITAL_COMMUNITY)
Admission: AD | Admit: 2018-02-07 | Discharge: 2018-02-07 | Disposition: A | Discharge status: Home / Self Care | Payer: Federal, State, Local not specified - PPO | Source: Ambulatory Visit | Attending: Obstetrics & Gynecology | Admitting: Obstetrics & Gynecology

## 2018-02-07 ENCOUNTER — Other Ambulatory Visit: Payer: Self-pay

## 2018-02-07 DIAGNOSIS — R109 Unspecified abdominal pain: Secondary | ICD-10-CM | POA: Insufficient documentation

## 2018-02-07 DIAGNOSIS — Z7982 Long term (current) use of aspirin: Secondary | ICD-10-CM | POA: Insufficient documentation

## 2018-02-07 DIAGNOSIS — Z3A15 15 weeks gestation of pregnancy: Secondary | ICD-10-CM | POA: Diagnosis not present

## 2018-02-07 DIAGNOSIS — O26899 Other specified pregnancy related conditions, unspecified trimester: Secondary | ICD-10-CM

## 2018-02-07 DIAGNOSIS — O26892 Other specified pregnancy related conditions, second trimester: Secondary | ICD-10-CM | POA: Diagnosis not present

## 2018-02-07 DIAGNOSIS — R102 Pelvic and perineal pain: Secondary | ICD-10-CM

## 2018-02-07 LAB — URINALYSIS, ROUTINE W REFLEX MICROSCOPIC
Bacteria, UA: NONE SEEN
Bilirubin Urine: NEGATIVE
Glucose, UA: NEGATIVE mg/dL
Hgb urine dipstick: NEGATIVE
Ketones, ur: NEGATIVE mg/dL
Nitrite: NEGATIVE
Protein, ur: NEGATIVE mg/dL
Specific Gravity, Urine: 1.02 (ref 1.005–1.030)
pH: 6 (ref 5.0–8.0)

## 2018-02-07 LAB — CBC
HCT: 31.3 % — ABNORMAL LOW (ref 36.0–46.0)
Hemoglobin: 10.6 g/dL — ABNORMAL LOW (ref 12.0–15.0)
MCH: 29.1 pg (ref 26.0–34.0)
MCHC: 33.9 g/dL (ref 30.0–36.0)
MCV: 86 fL (ref 80.0–100.0)
Platelets: 250 10*3/uL (ref 150–400)
RBC: 3.64 MIL/uL — ABNORMAL LOW (ref 3.87–5.11)
RDW: 13.3 % (ref 11.5–15.5)
WBC: 5.9 10*3/uL (ref 4.0–10.5)
nRBC: 0 % (ref 0.0–0.2)

## 2018-02-07 MED ORDER — CYCLOBENZAPRINE HCL 10 MG PO TABS
10.0000 mg | ORAL_TABLET | Freq: Three times a day (TID) | ORAL | Status: DC | PRN
  Administered 2018-02-07: 10 mg via ORAL
  Filled 2018-02-07: qty 1

## 2018-02-07 MED ORDER — ACETAMINOPHEN 500 MG PO TABS
1000.0000 mg | ORAL_TABLET | Freq: Four times a day (QID) | ORAL | Status: DC | PRN
  Administered 2018-02-07: 1000 mg via ORAL
  Filled 2018-02-07: qty 2

## 2018-02-07 NOTE — Discharge Instructions (Signed)

## 2018-02-07 NOTE — MAU Provider Note (Signed)
History     CSN: [id number]  Arrival date and time: 02/07/18 6578   First Provider Initiated Contact with Patient 02/07/18 1018      Chief Complaint  Patient presents with  . Abdominal Pain   G2P1001 @15 .6 wks here with lower abdominal cramping. Sx started last night. Describes as constant and worse on right. Rates pain 3/10. She tried lying down, hydration, and a shower but nothing has helped. Denies VB or vaginal discharge. Reports urinary urgency and frequency and denies other urinary sx. No fevers. Had some nausea last night but no vomiting. Denies constipation or diarrhea.    OB History    Gravida  2   Para  1   Term  1   Preterm  0   AB  0   Living  1     SAB  0   TAB  0   Ectopic  0   Multiple  0   Live Births  1           Past Medical History:  Diagnosis Date  . Asthma    seasonal as a child  . Gonorrhea   . Hx of chlamydia infection 2013    Past Surgical History:  Procedure Laterality Date  . NO PAST SURGERIES      Family History  Problem Relation Age of Onset  . Cancer Maternal Grandfather        ? agent orange  . Asthma Neg Hx   . Diabetes Neg Hx   . Heart disease Neg Hx   . Hypertension Neg Hx   . Stroke Neg Hx     Social History   Tobacco Use  . Smoking status: Never Smoker  . Smokeless tobacco: Never Used  Substance Use Topics  . Alcohol use: No    Alcohol/week: 1.0 standard drinks    Types: 1 Shots of liquor per week    Comment: rare, spicial occasions, not while preg  . Drug use: No    Allergies:  Allergies  Allergen Reactions  . Latex Hives  . Other Anaphylaxis    walnuts    No medications prior to admission.    Review of Systems  Constitutional: Negative for fever.  Gastrointestinal: Positive for abdominal pain and nausea. Negative for constipation, diarrhea and vomiting.  Genitourinary: Positive for frequency and urgency. Negative for dyspareunia, dysuria, hematuria, vaginal bleeding and vaginal  discharge.  Musculoskeletal: Positive for back pain.   Physical Exam   Blood pressure 100/61, pulse 73, temperature 98.1 F (36.7 C), temperature source Oral, resp. rate 20, last menstrual period 10/19/2017, SpO2 100 %, not currently breastfeeding.  Physical Exam  Constitutional: She is oriented to person, place, and time. She appears well-developed and well-nourished. No distress.  HENT:  Head: Normocephalic and atraumatic.  Neck: Normal range of motion.  Cardiovascular: Normal rate.  Respiratory: Effort normal. No respiratory distress.  GI: Soft. Bowel sounds are normal. She exhibits no distension and no mass. There is no tenderness. There is no rebound and no guarding.  Gravid below umbilicus  Genitourinary:  Genitourinary Comments: VE: closed/long  Musculoskeletal: Normal range of motion.  Neurological: She is alert and oriented to person, place, and time.  Skin: Skin is warm.  Psychiatric: She has a normal mood and affect.  FHT 152  Results for orders placed or performed during the hospital encounter of 02/07/18 (from the past 24 hour(s))  Urinalysis, Routine w reflex microscopic     Status: Abnormal   Collection Time:  02/07/18 10:32 AM  Result Value Ref Range   Color, Urine YELLOW YELLOW   APPearance HAZY (A) CLEAR   Specific Gravity, Urine 1.020 1.005 - 1.030   pH 6.0 5.0 - 8.0   Glucose, UA NEGATIVE NEGATIVE mg/dL   Hgb urine dipstick NEGATIVE NEGATIVE   Bilirubin Urine NEGATIVE NEGATIVE   Ketones, ur NEGATIVE NEGATIVE mg/dL   Protein, ur NEGATIVE NEGATIVE mg/dL   Nitrite NEGATIVE NEGATIVE   Leukocytes, UA SMALL (A) NEGATIVE   RBC / HPF 0-5 0 - 5 RBC/hpf   WBC, UA 6-10 0 - 5 WBC/hpf   Bacteria, UA NONE SEEN NONE SEEN   Squamous Epithelial / LPF 11-20 0 - 5   Mucus PRESENT   CBC     Status: Abnormal   Collection Time: 02/07/18 10:58 AM  Result Value Ref Range   WBC 5.9 4.0 - 10.5 K/uL   RBC 3.64 (L) 3.87 - 5.11 MIL/uL   Hemoglobin 10.6 (L) 12.0 - 15.0 g/dL    HCT 29.5 (L) 62.1 - 46.0 %   MCV 86.0 80.0 - 100.0 fL   MCH 29.1 26.0 - 34.0 pg   MCHC 33.9 30.0 - 36.0 g/dL   RDW 30.8 65.7 - 84.6 %   Platelets 250 150 - 400 K/uL   nRBC 0.0 0.0 - 0.2 %    MAU Course  Procedures Tylenol Flexeril Heating pad  MDM Labs ordered and reviewed. Recent GC neg therefore not repeated today. Pain improved from meds and heat. No evidence of UTI or threatened SAB. Pain likely d/t mild dehydration and/or RL. Discussed comfort measures. Stable for discharge home.  Assessment and Plan   1. [redacted] weeks gestation of pregnancy   2. Pain of round ligament during pregnancy    Discharge home Follow [facility] at [facility] as scheduled SAB/return precautions  Allergies as of 02/07/2018      Reactions   Latex Hives   Other Anaphylaxis   walnuts      Medication List    STOP taking these medications   docusate sodium 100 MG capsule Commonly known as:  COLACE   ondansetron 8 MG disintegrating tablet Commonly known as:  ZOFRAN-ODT   promethazine 25 MG tablet Commonly known as:  PHENERGAN     TAKE these medications   albuterol 108 (90 Base) MCG/ACT inhaler Commonly known as:  PROVENTIL HFA;VENTOLIN HFA Inhale 1-2 puffs into the lungs every 6 (six) hours as needed for wheezing or shortness of breath.   aspirin EC 81 MG tablet Take 1 tablet (81 mg total) by mouth daily.   COMFORT FIT MATERNITY SUPP MED Misc 1 Device by Does not apply route daily.   CONCEPT OB 130-92.4-1 MG Caps Take 1 capsule by mouth daily.      Donette Larry, CNM 02/07/2018, 12:02 PM

## 2018-02-07 NOTE — MAU Note (Signed)
Pain in lower abd started yesterday.  Cramping.  Still hurting when went to bed last night, thought it was gas.  Woke up with pain this morning, even when not cramping, still has pressure.  Denies any GI or GU complaints.

## 2018-02-22 ENCOUNTER — Encounter (HOSPITAL_COMMUNITY): Payer: Self-pay

## 2018-02-27 ENCOUNTER — Ambulatory Visit (INDEPENDENT_AMBULATORY_CARE_PROVIDER_SITE_OTHER): Payer: Federal, State, Local not specified - PPO | Admitting: Family Medicine

## 2018-02-27 VITALS — BP 90/67 | HR 85 | Wt 158.1 lb

## 2018-02-27 DIAGNOSIS — Z3480 Encounter for supervision of other normal pregnancy, unspecified trimester: Secondary | ICD-10-CM

## 2018-02-27 DIAGNOSIS — Z8759 Personal history of other complications of pregnancy, childbirth and the puerperium: Secondary | ICD-10-CM

## 2018-02-27 NOTE — Progress Notes (Signed)
Subjective:  Andrea Foster is a 27 y.o. G2P1001 at [redacted]w[redacted]d being seen today for ongoing prenatal care.  She is currently monitored for the following issues for this low-risk pregnancy and has Short interval between pregnancies affecting pregnancy in first trimester, antepartum; Encounter for supervision of normal pregnancy; and History of gestational hypertension on their problem list.  Patient reports one episode of bleeding last week following intercourse. No additional bleeding, and symptoms have resolved.  Contractions: Not present. Vag. Bleeding: None.  Movement: Present. Denies leaking of fluid.   Pt was taking gummy prenatal vitamins earlier in pregnancy. Was found to be anemic and started on iron supplementation. Has since switched to prenatal vitamin with iron and stopped separate iron supplement. No fatigue, dizziness, lightheadedness.   The following portions of the patient's history were reviewed and updated as appropriate: allergies, current medications, past family history, past medical history, past social history, past surgical history and problem list. Problem list updated.  Objective:   Vitals:   02/27/18 1108  BP: 90/67  Pulse: 85  Weight: 158 lb 1.6 oz (71.7 kg)    Fetal Status: Fetal Heart Rate (bpm): 152   Movement: Present     General:  Alert, oriented and cooperative. Patient is in no acute distress.  Skin: Skin is warm and dry. No rash noted.   Cardiovascular: Normal heart rate noted  Respiratory: Normal respiratory effort, no problems with respiration noted  Abdomen: Soft, gravid, appropriate for gestational age. Pain/Pressure: Absent     Pelvic: Vag. Bleeding: None     Cervical exam deferred        Extremities: Normal range of motion.  Edema: None  Mental Status: Normal mood and affect. Normal behavior. Normal judgment and thought content.    Assessment and Plan:  Pregnancy: G2P1001 at [redacted]w[redacted]d  1. Supervision of other normal pregnancy, antepartum Doing  well. Reviewed BTL and additional birth control methods, including IUD. Pt interested in BTL, and will plan to sign BTL paperwork at 28-week visit and continue to discuss alternative methods. - 19-week [place] scheduled 03/01/18 - Continue prenatal vitamin with iron daily - Will monitor CBC on future labs  2. History of gestational hypertension -  BP stable - Continue ASA 81 mcg daily for preeclampsia prophylaxis  Preterm labor symptoms and general obstetric precautions including but not limited to vaginal bleeding, contractions, leaking of fluid and fetal movement were reviewed in detail with the patient. Please refer to After Visit Summary for other counseling recommendations.   No follow-ups on file.  Hal Neer, Medical Student  Attestation: I have seen this patient and agree with the medical student's documentation. I have examined them separately, and we have discussed the plan of care.  Cristal Deer. Earlene Plater, DO OB/GYN Fellow

## 2018-02-27 NOTE — Patient Instructions (Addendum)
Deciding about Circumcision in Baby Boys  (The Basics)  What is circumcision?  Circumcision is a surgery that removes the skin that covers the tip of the penis, called the "foreskin" Circumcision is usually done when a boy is between 72 and 99 days old. In the [place], circumcision is common. In some other countries, fewer boys are circumcised. Circumcision is a common tradition in some religions.  Should I have my baby boy circumcised?  There is no easy answer. Circumcision has some benefits. But it also has risks. After talking with your doctor, you will have to decide for yourself what is right for your family.  What are the benefits of circumcision?  Circumcised boys seem to have slightly lower rates of: ?Urinary tract infections ?Swelling of the opening at the tip of the penis Circumcised men seem to have slightly lower rates of: ?Urinary tract infections ?Swelling of the opening at the tip of the penis ?Penis cancer ?HIV and other infections that you catch during sex ?Cervical cancer in the women they have sex with Even so, in the [place], the risks of these problems are small - even in boys and men who have not been circumcised. Plus, boys and men who are not circumcised can reduce these extra risks by: ?Cleaning their penis well ?Using condoms during sex  What are the risks of circumcision?  Risks include: ?Bleeding or infection from the surgery ?Damage to or amputation of the penis ?A chance that the doctor will cut off too much or not enough of the foreskin ?A chance that sex won't feel as good later in life Only about 1 out of every 200 circumcisions leads to problems. There is also a chance that your health insurance won't pay for circumcision.  How is circumcision done in baby boys?  First, the baby gets medicine for pain relief. This might be a cream on the skin or a shot into the base of the penis. Next, the doctor cleans the baby's penis well. Then  he or she uses special tools to cut off the foreskin. Finally, the doctor wraps a bandage (called gauze) around the baby's penis. If you have your baby circumcised, his doctor or nurse will give you instructions on how to care for him after the surgery. It is important that you follow those instructions carefully.    Second Trimester of Pregnancy The second trimester is from week 13 through week 28, month 4 through 6. This is often the time in pregnancy that you feel your best. Often times, morning sickness has lessened or quit. You may have more energy, and you may get hungry more often. Your unborn baby (fetus) is growing rapidly. At the end of the sixth month, he or she is about 9 inches long and weighs about 1 pounds. You will likely feel the baby move (quickening) between 18 and 20 weeks of pregnancy. Follow these instructions at home:  Avoid all smoking, herbs, and alcohol. Avoid drugs not approved by your doctor.  Do not use any tobacco products, including cigarettes, chewing tobacco, and electronic cigarettes. If you need help quitting, ask your doctor. You may get counseling or other support to help you quit.  Only take medicine as told by your doctor. Some medicines are safe and some are not during pregnancy.  Exercise only as told by your doctor. Stop exercising if you start having cramps.  Eat regular, healthy meals.  Wear a good support bra if your breasts are tender.  Do  not use hot tubs, steam rooms, or saunas.  Wear your seat belt when driving.  Avoid raw meat, uncooked cheese, and liter boxes and soil used by cats.  Take your prenatal vitamins.  Take 1500-2000 milligrams of calcium daily starting at the 20th week of pregnancy until you deliver your baby.  Try taking medicine that helps you poop (stool softener) as needed, and if your doctor approves. Eat more fiber by eating fresh fruit, vegetables, and whole grains. Drink enough fluids to keep your pee (urine) clear  or pale yellow.  Take warm water baths (sitz baths) to soothe pain or discomfort caused by hemorrhoids. Use hemorrhoid cream if your doctor approves.  If you have puffy, bulging veins (varicose veins), wear support hose. Raise (elevate) your feet for 15 minutes, 3-4 times a day. Limit salt in your diet.  Avoid heavy lifting, wear low heals, and sit up straight.  Rest with your legs raised if you have leg cramps or low back pain.  Visit your dentist if you have not gone during your pregnancy. Use a soft toothbrush to brush your teeth. Be gentle when you floss.  You can have sex (intercourse) unless your doctor tells you not to.  Go to your doctor visits. Get help if:  You feel dizzy.  You have mild cramps or pressure in your lower belly (abdomen).  You have a nagging pain in your belly area.  You continue to feel sick to your stomach (nauseous), throw up (vomit), or have watery poop (diarrhea).  You have bad smelling fluid coming from your vagina.  You have pain with peeing (urination). Get help right away if:  You have a fever.  You are leaking fluid from your vagina.  You have spotting or bleeding from your vagina.  You have severe belly cramping or pain.  You lose or gain weight rapidly.  You have trouble catching your breath and have chest pain.  You notice sudden or extreme puffiness (swelling) of your face, hands, ankles, feet, or legs.  You have not felt the baby move in over an hour.  You have severe headaches that do not go away with medicine.  You have vision changes. This information is not intended to replace advice given to you by your health care provider. Make sure you discuss any questions you have with your health care provider. Document Released: 07/12/2009 Document Revised: 09/23/2015 Document Reviewed: 06/18/2012 Elsevier Interactive Patient Education  2017 [employer].

## 2018-03-01 ENCOUNTER — Other Ambulatory Visit (HOSPITAL_COMMUNITY): Payer: Self-pay | Admitting: *Deleted

## 2018-03-01 ENCOUNTER — Ambulatory Visit (HOSPITAL_COMMUNITY)
Admission: RE | Admit: 2018-03-01 | Discharge: 2018-03-01 | Disposition: A | Discharge status: Home / Self Care | Payer: Federal, State, Local not specified - PPO | Source: Ambulatory Visit

## 2018-03-01 DIAGNOSIS — Z3A19 19 weeks gestation of pregnancy: Secondary | ICD-10-CM | POA: Insufficient documentation

## 2018-03-01 DIAGNOSIS — Z363 Encounter for antenatal screening for malformations: Secondary | ICD-10-CM | POA: Diagnosis not present

## 2018-03-01 DIAGNOSIS — Z3401 Encounter for supervision of normal first pregnancy, first trimester: Secondary | ICD-10-CM | POA: Insufficient documentation

## 2018-03-01 DIAGNOSIS — Z3689 Encounter for other specified antenatal screening: Secondary | ICD-10-CM

## 2018-03-27 ENCOUNTER — Encounter: Payer: Federal, State, Local not specified - PPO | Admitting: Advanced Practice Midwife

## 2018-04-01 ENCOUNTER — Ambulatory Visit (HOSPITAL_COMMUNITY)
Admission: RE | Admit: 2018-04-01 | Discharge: 2018-04-01 | Disposition: A | Discharge status: Home / Self Care | Payer: Federal, State, Local not specified - PPO | Source: Ambulatory Visit

## 2018-04-01 DIAGNOSIS — Z3A23 23 weeks gestation of pregnancy: Secondary | ICD-10-CM | POA: Diagnosis not present

## 2018-04-01 DIAGNOSIS — Z362 Encounter for other antenatal screening follow-up: Secondary | ICD-10-CM | POA: Insufficient documentation

## 2018-04-01 DIAGNOSIS — Z3689 Encounter for other specified antenatal screening: Secondary | ICD-10-CM

## 2018-04-10 ENCOUNTER — Encounter: Payer: Self-pay | Admitting: Nurse Practitioner

## 2018-04-10 ENCOUNTER — Ambulatory Visit (INDEPENDENT_AMBULATORY_CARE_PROVIDER_SITE_OTHER): Payer: Federal, State, Local not specified - PPO | Admitting: Nurse Practitioner

## 2018-04-10 VITALS — BP 104/65 | HR 79 | Wt 166.0 lb

## 2018-04-10 DIAGNOSIS — Z3480 Encounter for supervision of other normal pregnancy, unspecified trimester: Secondary | ICD-10-CM

## 2018-04-10 DIAGNOSIS — Z3482 Encounter for supervision of other normal pregnancy, second trimester: Secondary | ICD-10-CM

## 2018-04-10 NOTE — Patient Instructions (Addendum)
Deciding about Circumcision in Baby Boys  (The Basics)  What is circumcision?   Circumcision is a surgery that removes the skin that covers the tip of the penis, called the "foreskin" Circumcision is usually done when a boy is between 1 and 10 days old. In the [place], circumcision is common. In some other countries, fewer boys are circumcised. Circumcision is a common tradition in some religions.  Should I have my baby boy circumcised?   There is no easy answer. Circumcision has some benefits. But it also has risks. After talking with your doctor, you will have to decide for yourself what is right for your family.  What are the benefits of circumcision?   Circumcised boys seem to have slightly lower rates of: ?Urinary tract infections ?Swelling of the opening at the tip of the penis Circumcised men seem to have slightly lower rates of: ?Urinary tract infections ?Swelling of the opening at the tip of the penis ?Penis cancer ?HIV and other infections that you catch during sex ?Cervical cancer in the women they have sex with Even so, in the [place], the risks of these problems are small - even in boys and men who have not been circumcised. Plus, boys and men who are not circumcised can reduce these extra risks by: ?Cleaning their penis well ?Using condoms during sex  What are the risks of circumcision?  Risks include: ?Bleeding or infection from the surgery ?Damage to or amputation of the penis ?A chance that the doctor will cut off too much or not enough of the foreskin ?A chance that sex won't feel as good later in life Only about 1 out of every 200 circumcisions leads to problems. There is also a chance that your health insurance won't pay for circumcision.  How is circumcision done in baby boys?  First, the baby gets medicine for pain relief. This might be a cream on the skin or a shot into the base of the penis. Next, the doctor cleans the baby's penis well. Then  he or she uses special tools to cut off the foreskin. Finally, the doctor wraps a bandage (called gauze) around the baby's penis. If you have your baby circumcised, his doctor or nurse will give you instructions on how to care for him after the surgery. It is important that you follow those instructions carefully.  

## 2018-04-10 NOTE — Progress Notes (Signed)
    Subjective:  Andrea Foster is a 27 y.o. G2P1001 at 2448w5d being seen today for ongoing prenatal care.  She is currently monitored for the following issues for this low-risk pregnancy and has Short interval between pregnancies affecting pregnancy in first trimester, antepartum; Encounter for supervision of normal pregnancy; and History of gestational hypertension on their problem list.  Patient reports no complaints.  Contractions: Not present. Vag. Bleeding: None.  Movement: Present. Denies leaking of fluid.   The following portions of the patient's history were reviewed and updated as appropriate: allergies, current medications, past family history, past medical history, past social history, past surgical history and problem list. Problem list updated.  Objective:   Vitals:   04/10/18 0933  BP: 104/65  Pulse: 79  Weight: 166 lb (75.3 kg)    Fetal Status: Fetal Heart Rate (bpm): 147 Fundal Height: 28 cm Movement: Present     General:  Alert, oriented and cooperative. Patient is in no acute distress.  Skin: Skin is warm and dry. No rash noted.   Cardiovascular: Normal heart rate noted  Respiratory: Normal respiratory effort, no problems with respiration noted  Abdomen: Soft, gravid, appropriate for gestational age. Pain/Pressure: Absent     Pelvic:  Cervical exam deferred        Extremities: Normal range of motion.  Edema: None  Mental Status: Normal mood and affect. Normal behavior. Normal judgment and thought content.   Urinalysis:      Assessment and Plan:  Pregnancy: G2P1001 at 2448w5d  1. Supervision of other normal pregnancy, antepartum Sign tubal papers next visit  Preterm labor symptoms and general obstetric precautions including but not limited to vaginal bleeding, contractions, leaking of fluid and fetal movement were reviewed in detail with the patient. Please refer to After Visit Summary for other counseling recommendations.  Return in about 4 weeks (around  05/08/2018).  Nolene BernheimERRI BURLESON, RN, MSN, NP-BC Nurse Practitioner, [facility], [facility] 04/10/2018 9:57 AM

## 2018-04-26 ENCOUNTER — Encounter: Payer: Federal, State, Local not specified - PPO | Admitting: Student

## 2018-05-01 NOTE — L&D Delivery Note (Addendum)
OB/GYN Faculty Practice Delivery Note  Andrea Foster is a 28 y.o. G2P1001 s/p SVD at [redacted]w[redacted]d. She was admitted for SOL.   ROM: immediately prior to delivery, clear fluid GBS Status: negative Maximum Maternal Temperature: 98.2  Labor Progress: . Progressed to complete without augmentation   Delivery Date/Time: 0405 07/23/2018 Delivery: Called to room and patient was complete and pushing. Head delivered LOA. No nuchal cord present. Shoulder and body delivered in usual fashion. Infant with spontaneous cry, placed on mother's abdomen, dried and stimulated. Cord clamped x 2 after 1-minute delay, and cut by FOB. Cord blood drawn. Placenta delivered spontaneously with gentle cord traction. Fundus firm with massage and Pitocin. Labia, perineum, vagina, and cervix inspected inspected with bilateral labial abrasion, hemostatic, no repair indicated.   Placenta: delivered spontaneously, intact Complications: none Lacerations: labial abrasion, no repairs EBL: 65ml  Infant: vigorous bleeding  APGARs 8 & 9  weight pending  Burman Nieves, MD Family Medicine Resident    I was gloved and present for entire second stage and delivery SVD without incident No difficulty with shoulders Lacerations as listed above, repair not indicated  Clayton Bibles, CNM 07/23/18  4:28 AM

## 2018-05-08 ENCOUNTER — Encounter: Payer: Self-pay | Admitting: Medical

## 2018-05-08 ENCOUNTER — Ambulatory Visit (INDEPENDENT_AMBULATORY_CARE_PROVIDER_SITE_OTHER): Payer: Federal, State, Local not specified - PPO | Admitting: Medical

## 2018-05-08 VITALS — BP 114/70 | HR 133 | Wt 171.0 lb

## 2018-05-08 DIAGNOSIS — O99013 Anemia complicating pregnancy, third trimester: Secondary | ICD-10-CM

## 2018-05-08 DIAGNOSIS — Z23 Encounter for immunization: Secondary | ICD-10-CM | POA: Diagnosis not present

## 2018-05-08 DIAGNOSIS — Z3009 Encounter for other general counseling and advice on contraception: Secondary | ICD-10-CM

## 2018-05-08 DIAGNOSIS — Z3483 Encounter for supervision of other normal pregnancy, third trimester: Secondary | ICD-10-CM

## 2018-05-08 DIAGNOSIS — Z3A28 28 weeks gestation of pregnancy: Secondary | ICD-10-CM

## 2018-05-08 DIAGNOSIS — Z3480 Encounter for supervision of other normal pregnancy, unspecified trimester: Secondary | ICD-10-CM

## 2018-05-08 DIAGNOSIS — Z8759 Personal history of other complications of pregnancy, childbirth and the puerperium: Secondary | ICD-10-CM

## 2018-05-08 NOTE — Patient Instructions (Addendum)
Fetal Movement Counts Patient Name: ________________________________________________ Patient Due Date: ____________________ What is a fetal movement count?  A fetal movement count is the number of times that you feel your baby move during a certain amount of time. This may also be called a fetal kick count. A fetal movement count is recommended for every pregnant woman. You may be asked to start counting fetal movements as early as week 28 of your pregnancy. Pay attention to when your baby is most active. You may notice your baby's sleep and wake cycles. You may also notice things that make your baby move more. You should do a fetal movement count:  When your baby is normally most active.  At the same time each day. A good time to count movements is while you are resting, after having something to eat and drink. How do I count fetal movements? 1. Find a quiet, comfortable area. Sit, or lie down on your side. 2. Write down the date, the start time and stop time, and the number of movements that you felt between those two times. Take this information with you to your health care visits. 3. For 2 hours, count kicks, flutters, swishes, rolls, and jabs. You should feel at least 10 movements during 2 hours. 4. You may stop counting after you have felt 10 movements. 5. If you do not feel 10 movements in 2 hours, have something to eat and drink. Then, keep resting and counting for 1 hour. If you feel at least 4 movements during that hour, you may stop counting. Contact a health care provider if:  You feel fewer than 4 movements in 2 hours.  Your baby is not moving like he or she usually does. Date: ____________ Start time: ____________ Stop time: ____________ Movements: ____________ Date: ____________ Start time: ____________ Stop time: ____________ Movements: ____________ Date: ____________ Start time: ____________ Stop time: ____________ Movements: ____________ Date: ____________ Start time:  ____________ Stop time: ____________ Movements: ____________ Date: ____________ Start time: ____________ Stop time: ____________ Movements: ____________ Date: ____________ Start time: ____________ Stop time: ____________ Movements: ____________ Date: ____________ Start time: ____________ Stop time: ____________ Movements: ____________ Date: ____________ Start time: ____________ Stop time: ____________ Movements: ____________ Date: ____________ Start time: ____________ Stop time: ____________ Movements: ____________ This information is not intended to replace advice given to you by your health care provider. Make sure you discuss any questions you have with your health care provider. Document Released: 05/17/2006 Document Revised: 12/15/2015 Document Reviewed: 05/27/2015 Elsevier Interactive Patient Education  2019 [employer] Braxton Hicks Contractions Contractions of the uterus can occur throughout pregnancy, but they are not always a sign that you are in labor. You may have practice contractions called Braxton Hicks contractions. These false labor contractions are sometimes confused with true labor. What are Braxton Hicks contractions? Braxton Hicks contractions are tightening movements that occur in the muscles of the uterus before labor. Unlike true labor contractions, these contractions do not result in opening (dilation) and thinning of the cervix. Toward the end of pregnancy (32-34 weeks), Braxton Hicks contractions can happen more often and may become stronger. These contractions are sometimes difficult to tell apart from true labor because they can be very uncomfortable. You should not feel embarrassed if you go to the hospital with false labor. Sometimes, the only way to tell if you are in true labor is for your health care provider to look for changes in the cervix. The health care provider will do a physical exam and may monitor your contractions. If   you are not in true labor, the exam  should show that your cervix is not dilating and your water has not broken. If there are no other health problems associated with your pregnancy, it is completely safe for you to be sent home with false labor. You may continue to have Braxton Hicks contractions until you go into true labor. How to tell the difference between true labor and false labor True labor  Contractions last 30-70 seconds.  Contractions become very regular.  Discomfort is usually felt in the top of the uterus, and it spreads to the lower abdomen and low back.  Contractions do not go away with walking.  Contractions usually become more intense and increase in frequency.  The cervix dilates and gets thinner. False labor  Contractions are usually shorter and not as strong as true labor contractions.  Contractions are usually irregular.  Contractions are often felt in the front of the lower abdomen and in the groin.  Contractions may go away when you walk around or change positions while lying down.  Contractions get weaker and are shorter-lasting as time goes on.  The cervix usually does not dilate or become thin. Follow these instructions at home:   Take over-the-counter and prescription medicines only as told by your health care provider.  Keep up with your usual exercises and follow other instructions from your health care provider.  Eat and drink lightly if you think you are going into labor.  If Braxton Hicks contractions are making you uncomfortable: ? Change your position from lying down or resting to walking, or change from walking to resting. ? Sit and rest in a tub of warm water. ? Drink enough fluid to keep your urine pale yellow. Dehydration may cause these contractions. ? Do slow and deep breathing several times an hour.  Keep all follow-up prenatal visits as told by your health care provider. This is important. Contact a health care provider if:  You have a fever.  You have continuous  pain in your abdomen. Get help right away if:  Your contractions become stronger, more regular, and closer together.  You have fluid leaking or gushing from your vagina.  You pass blood-tinged mucus (bloody show).  You have bleeding from your vagina.  You have low back pain that you never had before.  You feel your baby's head pushing down and causing pelvic pressure.  Your baby is not moving inside you as much as it used to. Summary  Contractions that occur before labor are called Braxton Hicks contractions, false labor, or practice contractions.  Braxton Hicks contractions are usually shorter, weaker, farther apart, and less regular than true labor contractions. True labor contractions usually become progressively stronger and regular, and they become more frequent.  Manage discomfort from [facility] contractions by changing position, resting in a warm bath, drinking plenty of water, or practicing deep breathing. This information is not intended to replace advice given to you by your health care provider. Make sure you discuss any questions you have with your health care provider. Document Released: 08/31/2016 Document Revised: 01/30/2017 Document Reviewed: 08/31/2016 Elsevier Interactive Patient Education  2019 [employer].  Safe Medications in Pregnancy   Acne:  Benzoyl Peroxide  Salicylic Acid   Backache/Headache:  Tylenol: 2 regular strength every 4 hours OR        2 Extra strength every 6 hours   Colds/Coughs/Allergies:  Benadryl (alcohol free) 25 mg every 6 hours as needed  Breath right strips  Claritin  Cepacol throat lozenges  Chloraseptic throat spray  Cold-Eeze- up to three times per day  Cough drops, alcohol free  Flonase (by prescription only)  Guaifenesin  Mucinex  Robitussin DM (plain only, alcohol free)  Saline nasal spray/drops  Sudafed (pseudoephedrine) & Actifed * use only after [redacted] weeks gestation and if you do not have high blood  pressure  Tylenol  Vicks Vaporub  Zinc lozenges  Zyrtec   Constipation:  Colace  Ducolax suppositories  Fleet enema  Glycerin suppositories  Metamucil  Milk of magnesia  Miralax  Senokot  Smooth move tea   Diarrhea:  Kaopectate  Imodium A-D   *NO pepto Bismol   Hemorrhoids:  Anusol  Anusol HC  Preparation H  Tucks   Indigestion:  Tums  Maalox  Mylanta  Zantac  Pepcid   Insomnia:  Benadryl (alcohol free) 25mg  every 6 hours as needed  Tylenol PM  Unisom, no Gelcaps   Leg Cramps:  Tums  MagGel   Nausea/Vomiting:  Bonine  Dramamine  Emetrol  Ginger extract  Sea bands  Meclizine  Nausea medication to take during pregnancy:  Unisom (doxylamine succinate 25 mg tablets) Take one tablet daily at bedtime. If symptoms are not adequately controlled, the dose can be increased to a maximum recommended dose of two tablets daily (1/2 tablet in the morning, 1/2 tablet mid-afternoon and one at bedtime).  Vitamin B6 100mg  tablets. Take one tablet twice a day (up to 200 mg per day).   Skin Rashes:  Aveeno products  Benadryl cream or 25mg  every 6 hours as needed  Calamine Lotion  1% cortisone cream   Yeast infection:  Gyne-lotrimin 7  Monistat 7    **If taking multiple medications, please check labels to avoid duplicating the same active ingredients  **take medication as directed on the label  ** Do not exceed 4000 mg of tylenol in 24 hours  **Do not take medications that contain aspirin or ibuprofen         Places to have your son circumcised:    [facility] [phone] $480 while you are in hospital  [facility] [phone] $244 by 4 wks  [facility] [phone] $175 by 2 wks  [facility] [phone] $250 by 7 days [facility] [phone] $269 by 4 wks  These prices sometimes change but are  roughly what you can expect to pay. Please call and confirm pricing.   Circumcision is considered an elective/non-medically necessary procedure. There are many reasons parents decide to have their sons circumsized. During the first year of life circumcised males have a reduced risk of urinary tract infections but after this year the rates between circumcised males and uncircumcised males are the same.  It is safe to have your son circumcised outside of the hospital and the places above perform them regularly.   Deciding about Circumcision in Baby Boys  (Up-to-date The Basics)  What is circumcision?  Circumcision is a surgery that removes the skin that covers the tip of the penis, called the "foreskin" Circumcision is usually done when a boy is between 56 and 56 days old. In the [place], circumcision is common. In some other countries, fewer boys are circumcised. Circumcision is a common tradition in some religions.  Should I have my baby boy circumcised?  There is no easy answer. Circumcision has some benefits. But it also has risks. After talking with your doctor, you will have to decide for yourself what is right for your family.  What are the benefits of circumcision?  Circumcised boys seem to have slightly lower rates of: ?Urinary tract infections ?Swelling of the opening at the tip of the penis Circumcised men seem to have slightly lower rates of: ?Urinary tract infections ?Swelling of the opening at the tip of the penis ?Penis cancer ?HIV and other infections that you catch during sex ?Cervical cancer in the women they have sex with Even so, in the [place], the risks of these problems are small - even in boys and men who have not been circumcised. Plus, boys and men who are not circumcised can reduce these extra risks by: ?Cleaning their penis well ?Using condoms during sex  What are the risks of circumcision?  Risks include: ?Bleeding or infection from the  surgery ?Damage to or amputation of the penis ?A chance that the doctor will cut off too much or not enough of the foreskin ?A chance that sex won't feel as good later in life Only about 1 out of every 200 circumcisions leads to problems. There is also a chance that your health insurance won't pay for circumcision.  How is circumcision done in baby boys?  First, the baby gets medicine for pain relief. This might be a cream on the skin or a shot into the base of the penis. Next, the doctor cleans the baby's penis well. Then he or she uses special tools to cut off the foreskin. Finally, the doctor wraps a bandage (called gauze) around the baby's penis. If you have your baby circumcised, his doctor or nurse will give you instructions on how to care for him after the surgery. It is important that you follow those instructions carefully.

## 2018-05-08 NOTE — Progress Notes (Signed)
   PRENATAL VISIT NOTE  Subjective:  Andrea Foster is a 28 y.o. G2P1001 at [redacted]w[redacted]d being seen today for ongoing prenatal care.  She is currently monitored for the following issues for this low-risk pregnancy and has Short interval between pregnancies affecting pregnancy in first trimester, antepartum; Encounter for supervision of normal pregnancy; and History of gestational hypertension on their problem list.  Patient reports 2-3 days of upper respiratory systems without fever.  Contractions: Not present. Vag. Bleeding: None.  Movement: Present. Denies leaking of fluid.   The following portions of the patient's history were reviewed and updated as appropriate: allergies, current medications, past family history, past medical history, past social history, past surgical history and problem list. Problem list updated.  Objective:   Vitals:   05/08/18 1339  BP: 114/70  Pulse: (!) 133  Weight: 171 lb (77.6 kg)    Fetal Status: Fetal Heart Rate (bpm): 146 Fundal Height: 28 cm Movement: Present     General:  Alert, oriented and cooperative. Patient is in no acute distress.  Skin: Skin is warm and dry. No rash noted.   Cardiovascular: Normal heart rate noted  Respiratory: Normal respiratory effort, no problems with respiration noted, clear to auscultation  Abdomen: Soft, gravid, appropriate for gestational age.  Pain/Pressure: Absent     Pelvic: Cervical exam deferred        Extremities: Normal range of motion.  Edema: None  Mental Status: Normal mood and affect. Normal behavior. Normal judgment and thought content.   Assessment and Plan:  Pregnancy: G2P1001 at [redacted]w[redacted]d  1. Supervision of other normal pregnancy, antepartum - CBC - HIV Antibody (routine testing w rflx) - RPR - Glucose Tolerance, 1 Hour - Tdap vaccine greater than or equal to 7yo IM  2. Unwanted fertility - BTL consent form signed today  3. History of gestational hypertension - Normotensive today   Preterm labor  symptoms and general obstetric precautions including but not limited to vaginal bleeding, contractions, leaking of fluid and fetal movement were reviewed in detail with the patient. Please refer to After Visit Summary for other counseling recommendations.  Return in about 2 weeks (around 05/22/2018) for LOB.  Vonzella Nipple, PA-C

## 2018-05-09 LAB — GLUCOSE TOLERANCE, 1 HOUR: Glucose, 1Hr PP: 91 mg/dL (ref 65–199)

## 2018-05-09 LAB — CBC
Hematocrit: 29.3 % — ABNORMAL LOW (ref 34.0–46.6)
Hemoglobin: 10 g/dL — ABNORMAL LOW (ref 11.1–15.9)
MCH: 29.6 pg (ref 26.6–33.0)
MCHC: 34.1 g/dL (ref 31.5–35.7)
MCV: 87 fL (ref 79–97)
Platelets: 253 10*3/uL (ref 150–450)
RBC: 3.38 x10E6/uL — ABNORMAL LOW (ref 3.77–5.28)
RDW: 12.1 % (ref 11.7–15.4)
WBC: 7.2 10*3/uL (ref 3.4–10.8)

## 2018-05-09 LAB — HIV ANTIBODY (ROUTINE TESTING W REFLEX): HIV Screen 4th Generation wRfx: NONREACTIVE

## 2018-05-09 LAB — RPR: RPR Ser Ql: NONREACTIVE

## 2018-05-10 ENCOUNTER — Encounter (HOSPITAL_COMMUNITY): Payer: Self-pay | Admitting: Emergency Medicine

## 2018-05-10 ENCOUNTER — Inpatient Hospital Stay (HOSPITAL_COMMUNITY)
Admission: AD | Admit: 2018-05-10 | Discharge: 2018-05-10 | Disposition: A | Discharge status: Home / Self Care | Payer: Federal, State, Local not specified - PPO | Attending: Obstetrics and Gynecology | Admitting: Obstetrics and Gynecology

## 2018-05-10 DIAGNOSIS — O99513 Diseases of the respiratory system complicating pregnancy, third trimester: Secondary | ICD-10-CM | POA: Diagnosis not present

## 2018-05-10 DIAGNOSIS — J45909 Unspecified asthma, uncomplicated: Secondary | ICD-10-CM | POA: Diagnosis not present

## 2018-05-10 DIAGNOSIS — R0602 Shortness of breath: Secondary | ICD-10-CM | POA: Diagnosis not present

## 2018-05-10 DIAGNOSIS — Z3A29 29 weeks gestation of pregnancy: Secondary | ICD-10-CM | POA: Diagnosis not present

## 2018-05-10 DIAGNOSIS — J069 Acute upper respiratory infection, unspecified: Secondary | ICD-10-CM

## 2018-05-10 DIAGNOSIS — N859 Noninflammatory disorder of uterus, unspecified: Secondary | ICD-10-CM

## 2018-05-10 DIAGNOSIS — J4520 Mild intermittent asthma, uncomplicated: Secondary | ICD-10-CM

## 2018-05-10 DIAGNOSIS — O26893 Other specified pregnancy related conditions, third trimester: Secondary | ICD-10-CM

## 2018-05-10 DIAGNOSIS — N858 Other specified noninflammatory disorders of uterus: Secondary | ICD-10-CM

## 2018-05-10 LAB — URINALYSIS, ROUTINE W REFLEX MICROSCOPIC
Bilirubin Urine: NEGATIVE
Glucose, UA: NEGATIVE mg/dL
Hgb urine dipstick: NEGATIVE
Ketones, ur: NEGATIVE mg/dL
Leukocytes, UA: NEGATIVE
Nitrite: NEGATIVE
Protein, ur: NEGATIVE mg/dL
Specific Gravity, Urine: 1.001 — ABNORMAL LOW (ref 1.005–1.030)
pH: 8 (ref 5.0–8.0)

## 2018-05-10 LAB — FETAL FIBRONECTIN: Fetal Fibronectin: NEGATIVE

## 2018-05-10 MED ORDER — ALBUTEROL SULFATE (2.5 MG/3ML) 0.083% IN NEBU
2.5000 mg | INHALATION_SOLUTION | RESPIRATORY_TRACT | Status: DC | PRN
  Administered 2018-05-10: 2.5 mg via RESPIRATORY_TRACT
  Filled 2018-05-10: qty 3

## 2018-05-10 MED ORDER — ALBUTEROL SULFATE HFA 108 (90 BASE) MCG/ACT IN AERS: 2.0000 | INHALATION_SPRAY | Freq: Four times a day (QID) | RESPIRATORY_TRACT | 2 refills | Status: DC | PRN

## 2018-05-10 MED ORDER — FERROUS SULFATE 325 (65 FE) MG PO TABS: 325.0000 mg | ORAL_TABLET | Freq: Every day | ORAL | 3 refills | Status: DC

## 2018-05-10 NOTE — MAU Note (Signed)
Pt presents to mau with co SOB that started last night. It seems to get worse at night when she is laying down to go to sleep. Pt states that she started to have cold symptoms Monday and took some musinex and that seemed to help the cold and now she is just experiencing the tightness in her chest. Pt does have a hx of asthma but hasnt used her inhaler in over 3 years. +FM denies LOF VB.

## 2018-05-10 NOTE — MAU Provider Note (Signed)
Chief Complaint:  Shortness of Breath   First Provider Initiated Contact with Patient 05/10/18 [id number]      HPI: Andrea Foster is a 28 y.o. G2P1001 at 34w0dwho presents to maternity admissions reporting shortness of breath and chest congestion.  Has had a cold all week with nasal congestion. Shortness of breath is worse when lying flat. Has a history of asthma but hasnt used an inhaler for 5 years. . She reports good fetal movement, denies LOF, vaginal bleeding, vaginal itching/burning, urinary symptoms, h/a, dizziness, n/v, diarrhea, constipation or fever/chills.  .  RN Note: Pt presents to mau with co SOB that started last night. It seems to get worse at night when she is laying down to go to sleep. Pt states that she started to have cold symptoms Monday and took some musinex and that seemed to help the cold and now she is just experiencing the tightness in her chest. Pt does have a hx of asthma but hasnt used her inhaler in over 3 years. +FM denies LOF VB.   Past Medical History: Past Medical History:  Diagnosis Date  . Asthma    seasonal as a child  . Gonorrhea   . Hx of chlamydia infection 2013    Past obstetric history: OB History  Gravida Para Term Preterm AB Living  2 1 1  0 0 1  SAB TAB Ectopic Multiple Live Births  0 0 0 0 1    # Outcome Date GA Lbr Len/2nd Weight Sex Delivery Anes PTL Lv  2 Current           1 Term 04/26/17 [redacted]w[redacted]d 13:32 / 00:27 3756 g F Vag-Spont EPI  LIV     Birth Comments: caput    Past Surgical History: Past Surgical History:  Procedure Laterality Date  . NO PAST SURGERIES      Family History: Family History  Problem Relation Age of Onset  . Cancer Maternal Grandfather        ? agent orange  . Asthma Neg Hx   . Diabetes Neg Hx   . Heart disease Neg Hx   . Hypertension Neg Hx   . Stroke Neg Hx     Social History: Social History   Tobacco Use  . Smoking status: Never Smoker  . Smokeless tobacco: Never Used  Substance Use Topics  .  Alcohol use: No    Alcohol/week: 1.0 standard drinks    Types: 1 Shots of liquor per week    Comment: rare, spicial occasions, not while preg  . Drug use: No    Allergies:  Allergies  Allergen Reactions  . Latex Hives  . Other Anaphylaxis    walnuts    Meds:  Medications Prior to Admission  Medication Sig Dispense Refill Last Dose  . albuterol (PROVENTIL HFA;VENTOLIN HFA) 108 (90 Base) MCG/ACT inhaler Inhale 1-2 puffs into the lungs every 6 (six) hours as needed for wheezing or shortness of breath.   Taking  . aspirin EC 81 MG tablet Take 1 tablet (81 mg total) by mouth daily. (Patient not taking: Reported on 05/08/2018) 30 tablet 5 Not Taking  . Prenat w/o A Vit-FeFum-FePo-FA (CONCEPT OB) 130-92.4-1 MG CAPS Take 1 capsule by mouth daily. 30 capsule 11 Taking    I have reviewed patient's Past Medical Hx, Surgical Hx, Family Hx, Social Hx, medications and allergies.   ROS:  Review of Systems  Constitutional: Negative for chills and fever.  HENT: Positive for congestion and sinus pressure.   Respiratory: Positive  for cough, chest tightness and shortness of breath.   Gastrointestinal: Negative for abdominal pain, diarrhea and nausea.   Other systems negative  Physical Exam   Patient Vitals for the past 24 hrs:  BP Temp Temp src Pulse Resp Height Weight  05/10/18 0347 100/65 97.9 F (36.6 C) Oral 88 17 5\' 3"  (1.6 m) 81.2 kg   Constitutional: Well-developed, well-nourished female in no acute distress.  Cardiovascular: normal rate and rhythm Respiratory: normal effort, clear to auscultation bilaterally  No wheezing GI: Abd soft, non-tender, gravid appropriate for gestational age.   No rebound or guarding. MS: Extremities nontender, no edema, normal ROM Neurologic: Alert and oriented x 4.  GU: Neg CVAT.  PELVIC EXAM:    deferred FHT:  Baseline 140 , moderate variability, accelerations present, no decelerations Contractions: Uterine irritability, not felt by patient    Labs: O/Positive/-- (09/04 1031) Results for orders placed or performed during the hospital encounter of 05/10/18 (from the past 24 hour(s))  Urinalysis, Routine w reflex microscopic     Status: Abnormal   Collection Time: 05/10/18  3:32 AM  Result Value Ref Range   Color, Urine COLORLESS (A) YELLOW   APPearance CLEAR CLEAR   Specific Gravity, Urine 1.001 (L) 1.005 - 1.030   pH 8.0 5.0 - 8.0   Glucose, UA NEGATIVE NEGATIVE mg/dL   Hgb urine dipstick NEGATIVE NEGATIVE   Bilirubin Urine NEGATIVE NEGATIVE   Ketones, ur NEGATIVE NEGATIVE mg/dL   Protein, ur NEGATIVE NEGATIVE mg/dL   Nitrite NEGATIVE NEGATIVE   Leukocytes, UA NEGATIVE NEGATIVE  Fetal fibronectin     Status: None   Collection Time: 05/10/18  4:19 AM  Result Value Ref Range   Fetal Fibronectin NEGATIVE NEGATIVE    Imaging:  No results found.  MAU Course/MDM: I have ordered labs and reviewed results. Sent FFn due to irritability.  Negative..   Patient does not feel any cramping NST reviewed, reactive.  Treatments in MAU included Albuterol nebulizer which helped her tightness.  Felt much better afterward .    Assessment: 1. Shortness of breath   2.     Asthma 3.   Uterine irritability  Plan: Discharge home Rx Proventil inhaler for PRN use PTL precautions and fetal kick counts Follow up in Office for prenatal visits and recheck  Encouraged to return here or to other Urgent Care/ED if she develops worsening of symptoms, increase in pain, fever, or other concerning symptoms.  Pt stable at time of discharge.  Wynelle Bourgeois CNM, MSN Certified Nurse-Midwife 05/10/2018 4:12 AM

## 2018-05-10 NOTE — Discharge Instructions (Signed)

## 2018-05-10 NOTE — Addendum Note (Signed)
Addended by: Marny Lowenstein on: 05/10/2018 09:57 AM   Modules accepted: Orders

## 2018-05-14 ENCOUNTER — Inpatient Hospital Stay (HOSPITAL_COMMUNITY)
Admission: AD | Admit: 2018-05-14 | Discharge: 2018-05-15 | Disposition: A | Discharge status: Home / Self Care | Payer: Federal, State, Local not specified - PPO | Source: Ambulatory Visit | Attending: Family Medicine | Admitting: Family Medicine

## 2018-05-14 ENCOUNTER — Other Ambulatory Visit: Payer: Self-pay

## 2018-05-14 ENCOUNTER — Encounter (HOSPITAL_COMMUNITY): Payer: Self-pay

## 2018-05-14 DIAGNOSIS — Z7982 Long term (current) use of aspirin: Secondary | ICD-10-CM | POA: Diagnosis not present

## 2018-05-14 DIAGNOSIS — R1030 Lower abdominal pain, unspecified: Secondary | ICD-10-CM | POA: Diagnosis not present

## 2018-05-14 DIAGNOSIS — O4690 Antepartum hemorrhage, unspecified, unspecified trimester: Secondary | ICD-10-CM

## 2018-05-14 DIAGNOSIS — O479 False labor, unspecified: Secondary | ICD-10-CM

## 2018-05-14 DIAGNOSIS — Z3A29 29 weeks gestation of pregnancy: Secondary | ICD-10-CM | POA: Diagnosis not present

## 2018-05-14 DIAGNOSIS — N859 Noninflammatory disorder of uterus, unspecified: Secondary | ICD-10-CM | POA: Diagnosis not present

## 2018-05-14 DIAGNOSIS — O4700 False labor before 37 completed weeks of gestation, unspecified trimester: Secondary | ICD-10-CM

## 2018-05-14 DIAGNOSIS — N858 Other specified noninflammatory disorders of uterus: Secondary | ICD-10-CM

## 2018-05-14 LAB — URINALYSIS, ROUTINE W REFLEX MICROSCOPIC
Bacteria, UA: NONE SEEN
Bilirubin Urine: NEGATIVE
Glucose, UA: NEGATIVE mg/dL
Ketones, ur: 20 mg/dL — AB
Leukocytes, UA: NEGATIVE
Nitrite: NEGATIVE
Protein, ur: NEGATIVE mg/dL
Specific Gravity, Urine: 1.021 (ref 1.005–1.030)
pH: 6 (ref 5.0–8.0)

## 2018-05-14 MED ORDER — NIFEDIPINE 10 MG PO CAPS
10.0000 mg | ORAL_CAPSULE | ORAL | Status: DC | PRN
  Administered 2018-05-14: 10 mg via ORAL
  Filled 2018-05-14: qty 1

## 2018-05-14 MED ORDER — LACTATED RINGERS IV SOLN
INTRAVENOUS | Status: DC
  Administered 2018-05-14: 23:00:00 via INTRAVENOUS

## 2018-05-14 NOTE — MAU Provider Note (Signed)
Chief Complaint:  Abdominal Cramping   First Provider Initiated Contact with Patient 05/14/18 2218     HPI: Andrea Foster is a 28 y.o. G2P1001 at 329w4dwho presents to maternity admissions reporting Braxton-Hicks contractions and some spotting.  No active flow of blood.  States contractions do not hurt.  She reports good fetal movement, denies LOF, vaginal bleeding, vaginal itching/burning, urinary symptoms, h/a, dizziness, n/v, diarrhea, constipation or fever/chills.  She denies headache, visual changes or RUQ abdominal pain.  Abdominal Cramping  This is a new problem. The current episode started today. The onset quality is gradual. The problem occurs intermittently. The abdominal pain does not radiate. Pertinent negatives include no constipation, diarrhea, dysuria, fever, frequency or headaches. Nothing aggravates the pain. The pain is relieved by nothing. She has tried nothing for the symptoms.   RN Note: Pt states that earlier today she noticed she was spotting and having some braxton hicks.  Pt now reports lower abdominal cramping Pr reports +FM Denies LOF.   Past Medical History: Past Medical History:  Diagnosis Date  . Asthma    seasonal as a child  . Gonorrhea   . Hx of chlamydia infection 2013    Past obstetric history: OB History  Gravida Para Term Preterm AB Living  2 1 1  0 0 1  SAB TAB Ectopic Multiple Live Births  0 0 0 0 1    # Outcome Date GA Lbr Len/2nd Weight Sex Delivery Anes PTL Lv  2 Current           1 Term 04/26/17 673w5d 13:32 / 00:27 3756 g F Vag-Spont EPI  LIV     Birth Comments: caput    Past Surgical History: Past Surgical History:  Procedure Laterality Date  . NO PAST SURGERIES      Family History: Family History  Problem Relation Age of Onset  . Cancer Maternal Grandfather        ? agent orange  . Asthma Neg Hx   . Diabetes Neg Hx   . Heart disease Neg Hx   . Hypertension Neg Hx   . Stroke Neg Hx     Social History: Social  History   Tobacco Use  . Smoking status: Never Smoker  . Smokeless tobacco: Never Used  Substance Use Topics  . Alcohol use: No    Alcohol/week: 1.0 standard drinks    Types: 1 Shots of liquor per week    Comment: rare, spicial occasions, not while preg  . Drug use: No    Allergies:  Allergies  Allergen Reactions  . Latex Hives  . Other Anaphylaxis    walnuts    Meds:  Medications Prior to Admission  Medication Sig Dispense Refill Last Dose  . albuterol (PROVENTIL HFA;VENTOLIN HFA) 108 (90 Base) MCG/ACT inhaler Inhale 2 puffs into the lungs every 6 (six) hours as needed for wheezing or shortness of breath. 1 Inhaler 2 Past Week at Unknown time  . ferrous sulfate 325 (65 FE) MG tablet Take 1 tablet (325 mg total) by mouth daily with breakfast. 30 tablet 3 05/14/2018 at Unknown time  . Prenat w/o A Vit-FeFum-FePo-FA (CONCEPT OB) 130-92.4-1 MG CAPS Take 1 capsule by mouth daily. 30 capsule 11 05/14/2018 at Unknown time  . aspirin EC 81 MG tablet Take 1 tablet (81 mg total) by mouth daily. (Patient not taking: Reported on 05/08/2018) 30 tablet 5 Not Taking    I have reviewed patient's Past Medical Hx, Surgical Hx, Family Hx, Social Hx, medications and  allergies.   ROS:  Review of Systems  Constitutional: Negative for fever.  Gastrointestinal: Negative for constipation and diarrhea.  Genitourinary: Negative for dysuria and frequency.  Neurological: Negative for headaches.   Other systems negative  Physical Exam   Patient Vitals for the past 24 hrs:  BP Temp Temp src Pulse Resp SpO2 Weight  05/14/18 2146 97/61 98.3 F (36.8 C) Oral 96 20 99 % -  05/14/18 2128 - - - - - - 77.1 kg   Constitutional: Well-developed, well-nourished female in no acute distress.  Cardiovascular: normal rate and rhythm Respiratory: normal effort, clear to auscultation bilaterally GI: Abd soft, non-tender, gravid appropriate for gestational age.   No rebound or guarding. MS: Extremities nontender,  no edema, normal ROM Neurologic: Alert and oriented x 4.  GU: Neg CVAT.  PELVIC EXAM: Cervix pink, visually closed, without lesion, scant mucous and two spots of red blood on cervix. No active flow.  vaginal walls and external genitalia normal  Dilation: 1 Effacement (%): Thick Station: Ballotable Exam by:: Wynelle BourgeoisMarie Jahniya Duzan, CNM   FHT:  Baseline 140 , moderate variability, accelerations present, no decelerations Contractions: q 2-3 mins Irregular  Patient does not feel these contractions   Labs: Results for orders placed or performed during the hospital encounter of 05/14/18 (from the past 24 hour(s))  Urinalysis, Routine w reflex microscopic     Status: Abnormal   Collection Time: 05/14/18  9:31 PM  Result Value Ref Range   Color, Urine YELLOW YELLOW   APPearance HAZY (A) CLEAR   Specific Gravity, Urine 1.021 1.005 - 1.030   pH 6.0 5.0 - 8.0   Glucose, UA NEGATIVE NEGATIVE mg/dL   Hgb urine dipstick SMALL (A) NEGATIVE   Bilirubin Urine NEGATIVE NEGATIVE   Ketones, ur 20 (A) NEGATIVE mg/dL   Protein, ur NEGATIVE NEGATIVE mg/dL   Nitrite NEGATIVE NEGATIVE   Leukocytes, UA NEGATIVE NEGATIVE   RBC / HPF 0-5 0 - 5 RBC/hpf   WBC, UA 0-5 0 - 5 WBC/hpf   Bacteria, UA NONE SEEN NONE SEEN   Squamous Epithelial / LPF 6-10 0 - 5   Mucus PRESENT    O/Positive/-- (09/04 1031)  Imaging:  No results found.  MAU Course/MDM: I have ordered labs and reviewed results. Urine is clear NST reviewed, reactive IV fluids given for hydration Procardia given  This stopped contractions.  There remained small amount of irritability, not felt by patient.   Ultrasound ordered to evaluate placenta > no sign of abruption or previa  Assessment: Single intrauterine pregnancy at 3259w5d Preterm contractions, resolved with IV fluids and one dose of Procardia Vaginal spotting, presumptively due to contractions  Plan: Discharge home Strict Preterm Labor precautions and fetal kick counts Pelvic  rest Follow up in Office for prenatal visits and recheck of status  Encouraged to return here or to other Urgent Care/ED if she develops worsening of symptoms, increase in pain, fever, or other concerning symptoms.  Pt stable at time of discharge.  Wynelle BourgeoisMarie Maritsa Hunsucker CNM, MSN Certified Nurse-Midwife 05/14/2018 10:18 PM '

## 2018-05-14 NOTE — MAU Note (Signed)
Pt states that earlier today she noticed she was spotting and having some braxton hicks.   Pt now reports lower abdominal cramping  Pr reports +FM  Denies LOF.

## 2018-05-15 ENCOUNTER — Inpatient Hospital Stay (HOSPITAL_BASED_OUTPATIENT_CLINIC_OR_DEPARTMENT_OTHER): Payer: Federal, State, Local not specified - PPO

## 2018-05-15 DIAGNOSIS — O4693 Antepartum hemorrhage, unspecified, third trimester: Secondary | ICD-10-CM | POA: Diagnosis not present

## 2018-05-15 DIAGNOSIS — Z3A29 29 weeks gestation of pregnancy: Secondary | ICD-10-CM | POA: Diagnosis not present

## 2018-05-15 DIAGNOSIS — N859 Noninflammatory disorder of uterus, unspecified: Secondary | ICD-10-CM | POA: Diagnosis not present

## 2018-05-15 DIAGNOSIS — O4703 False labor before 37 completed weeks of gestation, third trimester: Secondary | ICD-10-CM | POA: Diagnosis not present

## 2018-05-15 NOTE — Discharge Instructions (Signed)
Activity Restriction During Pregnancy °Your health care provider may recommend specific activity restrictions during pregnancy for a variety of reasons. Activity restriction may require that you limit activities that require great effort, such as exercise, lifting, or sex. °The type of activity restriction will vary for each person, depending on your risk or the problems you are having. Activity restriction may be recommended for a period of time until your baby is delivered. °Why are activity restrictions recommended? °Activity restriction may be recommended if: °· Your placenta is partially or completely covering the opening of your cervix (placenta previa). °· There is bleeding between the wall of the uterus and the amniotic sac in the first trimester of pregnancy (subchorionic hemorrhage). °· You went into labor too early (preterm labor). °· You have a history of miscarriage. °· You have a condition that causes high blood pressure during pregnancy (preeclampsia or eclampsia). °· You are pregnant with more than one baby. °· Your baby is not growing well. °What are the risks? °The risks depend on your specific restriction. Strict bed rest has the most physical and emotional risks and is no longer routinely recommended. Risks of strict bed rest include: °· Loss of muscle conditioning from not moving. °· Blood clots. °· Social isolation. °· Depression. °· Loss of income. °Talk with your health care team about activity restriction to decide if it is best for you and your baby. Even if you are having problems during your pregnancy, you may be able to continue with normal levels of activity with careful monitoring by your health care team. °Follow these instructions at home: °If needed, based on your overall health and the health of your baby, your health care provider will decide which type of activity restriction is right for you. Activity restrictions may include: °· Not lifting anything heavier than 10 pounds (4.5  kg). °· Avoiding activities that take a lot of physical effort. °· No lifting or straining. °· Resting in a sitting position or lying down for periods of time during the day. °Pelvic rest may be recommended along with activity restrictions. If pelvic rest is recommended, then: °· Do not have sex, an orgasm, or use sexual stimulation. °· Do not use tampons. Do not douche. Do not put anything into your vagina. °· Do not lift anything that is heavier than 10 lb (4.5 kg). °· Avoid activities that require a lot of effort. °· Avoid any activity in which your pelvic muscles could become strained, such as squatting. °Questions to ask your health care provider °· Why is my activity being limited? °· How will activity restrictions affect my body? °· Why is rest helpful for me and my baby? °· What activities can I do? °· When can I return to normal activities? °When should I seek immediate medical care? °Seek immediate medical care if you have: °· Vaginal bleeding. °· Vaginal discharge. °· Cramping pain in your lower abdomen. °· Regular contractions. °· A low, dull backache. °Summary °· Your health care provider may recommend specific activity restrictions during pregnancy for a variety of reasons. °· Activity restriction may require that you limit activities such as exercise, lifting, sex, or any other activity that requires great effort. °· Discuss the risks and benefits of activity restriction with your health care team to decide if it is best for you and your baby. °· Contact your health care provider right away if you think you are having contractions, or if you notice vaginal bleeding, discharge, or cramping. °This information is not   intended to replace advice given to you by your health care provider. Make sure you discuss any questions you have with your health care provider. Document Released: 08/12/2010 Document Revised: 08/07/2017 Document Reviewed: 08/07/2017 Elsevier Interactive Patient Education  2019 [employer]. Preterm Labor and Birth Information Pregnancy normally lasts 39-41 weeks. Preterm labor is when labor starts early. It starts before you have been pregnant for 37 whole weeks. What are the risk factors for preterm labor? Preterm labor is more likely to occur in women who:  Have an infection while pregnant.  Have a cervix that is short.  Have gone into preterm labor before.  Have had surgery on their cervix.  Are younger than age 39.  Are older than age 22.  Are African American.  Are pregnant with two or more babies.  Take street drugs while pregnant.  Smoke while pregnant.  Do not gain enough weight while pregnant.  Got pregnant right after another pregnancy. What are the symptoms of preterm labor? Symptoms of preterm labor include:  Cramps. The cramps may feel like the cramps some women get during their period. The cramps may happen with watery poop (diarrhea).  Pain in the belly (abdomen).  Pain in the lower back.  Regular contractions or tightening. It may feel like your belly is getting tighter.  Pressure in the lower belly that seems to get stronger.  More fluid (discharge) leaking from the vagina. The fluid may be watery or bloody.  Water breaking. Why is it important to notice signs of preterm labor? Babies who are born early may not be fully developed. They have a higher chance for:  Long-term heart problems.  Long-term lung problems.  Trouble controlling body systems, like breathing.  Bleeding in the brain.  A condition called cerebral palsy.  Learning difficulties.  Death. These risks are highest for babies who are born before 34 weeks of pregnancy. How is preterm labor treated? Treatment depends on:  How long you were pregnant.  Your condition.  The health of your baby. Treatment may involve:  Having a stitch (suture) placed in your cervix. When you give birth, your cervix opens so the baby can come out. The stitch keeps the  cervix from opening too soon.  Staying at the hospital.  Taking or getting medicines, such as: ? Hormone medicines. ? Medicines to stop contractions. ? Medicines to help the babys lungs develop. ? Medicines to prevent your baby from having cerebral palsy. What should I do if I am in preterm labor? If you think you are going into labor too soon, call your doctor right away. How can I prevent preterm labor?  Do not use any tobacco products. ? Examples of these are cigarettes, chewing tobacco, and e-cigarettes. ? If you need help quitting, ask your doctor.  Do not use street drugs.  Do not use any medicines unless you ask your doctor if they are safe for you.  Talk with your doctor before taking any herbal supplements.  Make sure you gain enough weight.  Watch for infection. If you think you might have an infection, get it checked right away.  If you have gone into preterm labor before, tell your doctor. This information is not intended to replace advice given to you by your health care provider. Make sure you discuss any questions you have with your health care provider. Document Released: 07/14/2008 Document Revised: 09/28/2015 Document Reviewed: 09/08/2015 Elsevier Interactive Patient Education  2019 [employer].

## 2018-05-16 ENCOUNTER — Encounter: Payer: Self-pay | Admitting: *Deleted

## 2018-05-22 ENCOUNTER — Ambulatory Visit (INDEPENDENT_AMBULATORY_CARE_PROVIDER_SITE_OTHER): Payer: Federal, State, Local not specified - PPO | Admitting: Nurse Practitioner

## 2018-05-22 ENCOUNTER — Encounter: Payer: Federal, State, Local not specified - PPO | Admitting: Nurse Practitioner

## 2018-05-22 VITALS — BP 105/70 | HR 85 | Wt 174.7 lb

## 2018-05-22 DIAGNOSIS — Z3483 Encounter for supervision of other normal pregnancy, third trimester: Secondary | ICD-10-CM

## 2018-05-22 DIAGNOSIS — Z3A30 30 weeks gestation of pregnancy: Secondary | ICD-10-CM

## 2018-05-22 DIAGNOSIS — Z3480 Encounter for supervision of other normal pregnancy, unspecified trimester: Secondary | ICD-10-CM

## 2018-05-22 NOTE — Progress Notes (Signed)
    Subjective:  Andrea Foster is a 28 y.o. G2P1001 at 8619w5d being seen today for ongoing prenatal care.  She is currently monitored for the following issues for this low-risk pregnancy and has Short interval between pregnancies affecting pregnancy in first trimester, antepartum; Encounter for supervision of normal pregnancy; and History of gestational hypertension on their problem list. Reviewed her notes - was in MAU since her last visit.   Patient reports occasional contractions.  Contractions: Not present. Vag. Bleeding: None.  Movement: Present. Denies leaking of fluid.   The following portions of the patient's history were reviewed and updated as appropriate: allergies, current medications, past family history, past medical history, past social history, past surgical history and problem list. Problem list updated.  Objective:   Vitals:   05/22/18 1423  BP: 105/70  Pulse: 85  Weight: 174 lb 11.2 oz (79.2 kg)    Fetal Status: Fetal Heart Rate (bpm): 135 Fundal Height: 33 cm Movement: Present     General:  Alert, oriented and cooperative. Patient is in no acute distress.  Skin: Skin is warm and dry. No rash noted.   Cardiovascular: Normal heart rate noted  Respiratory: Normal respiratory effort, no problems with respiration noted  Abdomen: Soft, gravid, appropriate for gestational age. Pain/Pressure: Absent     Pelvic:  Cervical exam deferred        Extremities: Normal range of motion.  Edema: None  Mental Status: Normal mood and affect. Normal behavior. Normal judgment and thought content.   Urinalysis:      Assessment and Plan:  Pregnancy: G2P1001 at [redacted]w[redacted]d  1. Supervision of other normal pregnancy, antepartum Continue pelvic rest as she had some vaginal bleeding Also had one dose of procardia at MAU visit Reviewed contractions with her and currently is less than 4 in one hour Encouraged to return if contractions are increasing or if bleeding continues Cervix was closed  and thick last week.  Preterm labor symptoms and general obstetric precautions including but not limited to vaginal bleeding, contractions, leaking of fluid and fetal movement were reviewed in detail with the patient. Please refer to After Visit Summary for other counseling recommendations.  Return in about 2 weeks (around 06/05/2018).  Nolene BernheimERRI BURLESON, RN, MSN, NP-BC Nurse Practitioner, [facility], [facility] 05/22/2018 4:47 PM

## 2018-05-22 NOTE — Patient Instructions (Signed)
Braxton Hicks Contractions Contractions of the uterus can occur throughout pregnancy, but they are not always a sign that you are in labor. You may have practice contractions called Braxton Hicks contractions. These false labor contractions are sometimes confused with true labor. What are Braxton Hicks contractions? Braxton Hicks contractions are tightening movements that occur in the muscles of the uterus before labor. Unlike true labor contractions, these contractions do not result in opening (dilation) and thinning of the cervix. Toward the end of pregnancy (32-34 weeks), Braxton Hicks contractions can happen more often and may become stronger. These contractions are sometimes difficult to tell apart from true labor because they can be very uncomfortable. You should not feel embarrassed if you go to the hospital with false labor. Sometimes, the only way to tell if you are in true labor is for your health care provider to look for changes in the cervix. The health care provider will do a physical exam and may monitor your contractions. If you are not in true labor, the exam should show that your cervix is not dilating and your water has not broken. If there are no other health problems associated with your pregnancy, it is completely safe for you to be sent home with false labor. You may continue to have Braxton Hicks contractions until you go into true labor. How to tell the difference between true labor and false labor True labor  Contractions last 30-70 seconds.  Contractions become very regular.  Discomfort is usually felt in the top of the uterus, and it spreads to the lower abdomen and low back.  Contractions do not go away with walking.  Contractions usually become more intense and increase in frequency.  The cervix dilates and gets thinner. False labor  Contractions are usually shorter and not as strong as true labor contractions.  Contractions are usually irregular.  Contractions  are often felt in the front of the lower abdomen and in the groin.  Contractions may go away when you walk around or change positions while lying down.  Contractions get weaker and are shorter-lasting as time goes on.  The cervix usually does not dilate or become thin. Follow these instructions at home:   Take over-the-counter and prescription medicines only as told by your health care provider.  Keep up with your usual exercises and follow other instructions from your health care provider.  Eat and drink lightly if you think you are going into labor.  If Braxton Hicks contractions are making you uncomfortable: ? Change your position from lying down or resting to walking, or change from walking to resting. ? Sit and rest in a tub of warm water. ? Drink enough fluid to keep your urine pale yellow. Dehydration may cause these contractions. ? Do slow and deep breathing several times an hour.  Keep all follow-up prenatal visits as told by your health care provider. This is important. Contact a health care provider if:  You have a fever.  You have continuous pain in your abdomen. Get help right away if:  Your contractions become stronger, more regular, and closer together.  You have fluid leaking or gushing from your vagina.  You pass blood-tinged mucus (bloody show).  You have bleeding from your vagina.  You have low back pain that you never had before.  You feel your baby's head pushing down and causing pelvic pressure.  Your baby is not moving inside you as much as it used to. Summary  Contractions that occur before labor are   called Braxton Hicks contractions, false labor, or practice contractions.  Braxton Hicks contractions are usually shorter, weaker, farther apart, and less regular than true labor contractions. True labor contractions usually become progressively stronger and regular, and they become more frequent.  Manage discomfort from Braxton Hicks contractions  by changing position, resting in a warm bath, drinking plenty of water, or practicing deep breathing. This information is not intended to replace advice given to you by your health care provider. Make sure you discuss any questions you have with your health care provider. Document Released: 08/31/2016 Document Revised: 01/30/2017 Document Reviewed: 08/31/2016 Elsevier Interactive Patient Education  2019 Elsevier Inc.  

## 2018-06-06 ENCOUNTER — Ambulatory Visit (INDEPENDENT_AMBULATORY_CARE_PROVIDER_SITE_OTHER): Payer: Federal, State, Local not specified - PPO | Admitting: Obstetrics and Gynecology

## 2018-06-06 VITALS — BP 102/70 | HR 100 | Wt 175.0 lb

## 2018-06-06 DIAGNOSIS — Z8759 Personal history of other complications of pregnancy, childbirth and the puerperium: Secondary | ICD-10-CM

## 2018-06-06 DIAGNOSIS — Z3480 Encounter for supervision of other normal pregnancy, unspecified trimester: Secondary | ICD-10-CM

## 2018-06-06 DIAGNOSIS — Z3483 Encounter for supervision of other normal pregnancy, third trimester: Secondary | ICD-10-CM

## 2018-06-06 NOTE — Progress Notes (Signed)
   PRENATAL VISIT NOTE  Subjective:  Andrea Foster is a 28 y.o. G2P1001 at [redacted]w[redacted]d being seen today for ongoing prenatal care.  She is currently monitored for the following issues for this low-risk pregnancy and has Short interval between pregnancies affecting pregnancy in first trimester, antepartum; Encounter for supervision of normal pregnancy; and History of gestational hypertension on their problem list.  Patient reports no complaints.  Contractions: Not present. Vag. Bleeding: None.  Movement: Present. Denies leaking of fluid.   The following portions of the patient's history were reviewed and updated as appropriate: allergies, current medications, past family history, past medical history, past social history, past surgical history and problem list. Problem list updated.  Objective:   Vitals:   06/06/18 1036 06/06/18 1042 06/06/18 1104  BP: (!) 86/58 (!) 76/32 102/70  Pulse: (!) 102 99 100  Weight: 175 lb (79.4 kg)      Fetal Status: Fetal Heart Rate (bpm): 145   Movement: Present     General:  Alert, oriented and cooperative. Patient is in no acute distress.  Skin: Skin is warm and dry. No rash noted.   Cardiovascular: Normal heart rate noted  Respiratory: Normal respiratory effort, no problems with respiration noted  Abdomen: Soft, gravid, appropriate for gestational age.  Pain/Pressure: Absent     Pelvic: Cervical exam deferred        Extremities: Normal range of motion.  Edema: None  Mental Status: Normal mood and affect. Normal behavior. Normal judgment and thought content.   Assessment and Plan:  Pregnancy: G2P1001 at [redacted]w[redacted]d  1. Supervision of other normal pregnancy, antepartum   2. History of gestational hypertension  -BASA     There are no diagnoses linked to this encounter. Preterm labor symptoms and general obstetric precautions including but not limited to vaginal bleeding, contractions, leaking of fluid and fetal movement were reviewed in detail with the  patient. Please refer to After Visit Summary for other counseling recommendations.  No follow-ups on file.  Future Appointments  Date Time Provider Department Center  06/20/2018 10:35 AM Shauni Henner, Harolyn Rutherford, NP [facility]    Venia Carbon, NP

## 2018-06-16 ENCOUNTER — Inpatient Hospital Stay (HOSPITAL_COMMUNITY)
Admission: AD | Admit: 2018-06-16 | Discharge: 2018-06-16 | Disposition: A | Discharge status: Home / Self Care | Payer: Federal, State, Local not specified - PPO | Source: Ambulatory Visit | Attending: Obstetrics & Gynecology | Admitting: Obstetrics & Gynecology

## 2018-06-16 ENCOUNTER — Encounter (HOSPITAL_COMMUNITY): Payer: Self-pay

## 2018-06-16 DIAGNOSIS — O26853 Spotting complicating pregnancy, third trimester: Secondary | ICD-10-CM | POA: Diagnosis not present

## 2018-06-16 DIAGNOSIS — Z3A34 34 weeks gestation of pregnancy: Secondary | ICD-10-CM | POA: Diagnosis not present

## 2018-06-16 DIAGNOSIS — N930 Postcoital and contact bleeding: Secondary | ICD-10-CM

## 2018-06-16 DIAGNOSIS — O368130 Decreased fetal movements, third trimester, not applicable or unspecified: Secondary | ICD-10-CM | POA: Diagnosis not present

## 2018-06-16 DIAGNOSIS — Z3689 Encounter for other specified antenatal screening: Secondary | ICD-10-CM

## 2018-06-16 DIAGNOSIS — Z7982 Long term (current) use of aspirin: Secondary | ICD-10-CM | POA: Insufficient documentation

## 2018-06-16 LAB — URINALYSIS, ROUTINE W REFLEX MICROSCOPIC
Bilirubin Urine: NEGATIVE
Glucose, UA: NEGATIVE mg/dL
Ketones, ur: NEGATIVE mg/dL
Leukocytes,Ua: NEGATIVE
Nitrite: NEGATIVE
Protein, ur: NEGATIVE mg/dL
Specific Gravity, Urine: 1.01 (ref 1.005–1.030)
pH: 7 (ref 5.0–8.0)

## 2018-06-16 LAB — URINALYSIS, MICROSCOPIC (REFLEX)

## 2018-06-16 NOTE — MAU Note (Addendum)
Pt noticed scant bleeding when wiping after urinating around 1600 today. Continues to have scant red to brown blood. Had intercourse yesterday. Was on pelvic rest.  Had watery discharge earlier today. Feels decreased fetal movement.

## 2018-06-16 NOTE — MAU Provider Note (Signed)
History     CSN: [id number]  Arrival date and time: 06/16/18 2130   First Provider Initiated Contact with Patient 06/16/18 2211      Chief Complaint  Patient presents with  . Vaginal Bleeding  . Decreased Fetal Movement   HPI Andrea Foster is a 28 y.o. G2P1001 at [redacted]w[redacted]d who presents with spotting and decreased fetal movement. She states she had intercourse last night and had spotting all day since then. She denies any pain. She reports increased discharge, but unsure if it is fluid. She reports the baby has been moving less today. She denies any issues in the pregnancy and gets care [facility].  OB History    Gravida  2   Para  1   Term  1   Preterm  0   AB  0   Living  1     SAB  0   TAB  0   Ectopic  0   Multiple  0   Live Births  1           Past Medical History:  Diagnosis Date  . Asthma    seasonal as a child  . Gonorrhea   . Hx of chlamydia infection 2013    Past Surgical History:  Procedure Laterality Date  . NO PAST SURGERIES      Family History  Problem Relation Age of Onset  . Cancer Maternal Grandfather        ? agent orange  . Asthma Neg Hx   . Diabetes Neg Hx   . Heart disease Neg Hx   . Hypertension Neg Hx   . Stroke Neg Hx     Social History   Tobacco Use  . Smoking status: Never Smoker  . Smokeless tobacco: Never Used  Substance Use Topics  . Alcohol use: No    Alcohol/week: 1.0 standard drinks    Types: 1 Shots of liquor per week    Comment: rare, spicial occasions, not while preg  . Drug use: No    Allergies:  Allergies  Allergen Reactions  . Latex Hives  . Other Anaphylaxis    walnuts    Medications Prior to Admission  Medication Sig Dispense Refill Last Dose  . Prenat w/o A Vit-FeFum-FePo-FA (CONCEPT OB) 130-92.4-1 MG CAPS Take 1 capsule by mouth daily. 30 capsule 11 06/16/2018 at Unknown time  . albuterol (PROVENTIL HFA;VENTOLIN HFA) 108 (90 Base) MCG/ACT inhaler Inhale 2 puffs into the lungs every 6 (six)  hours as needed for wheezing or shortness of breath. 1 Inhaler 2 Taking  . aspirin EC 81 MG tablet Take 1 tablet (81 mg total) by mouth daily. (Patient not taking: Reported on 05/08/2018) 30 tablet 5 Not Taking  . ferrous sulfate 325 (65 FE) MG tablet Take 1 tablet (325 mg total) by mouth daily with breakfast. (Patient not taking: Reported on 06/06/2018) 30 tablet 3 Not Taking    Review of Systems  Constitutional: Negative.  Negative for fatigue and fever.  HENT: Negative.   Respiratory: Negative.  Negative for shortness of breath.   Cardiovascular: Negative.  Negative for chest pain.  Gastrointestinal: Negative.  Negative for abdominal pain, constipation, diarrhea, nausea and vomiting.  Genitourinary: Positive for vaginal bleeding and vaginal discharge. Negative for dysuria.  Neurological: Negative.  Negative for dizziness and headaches.   Physical Exam   Blood pressure 102/63, pulse 86, temperature 97.6 F (36.4 C), temperature source Oral, resp. rate 18, height 5\' 3"  (1.6 m), weight 81.2 kg,  last menstrual period 10/19/2017, SpO2 99 %, not currently breastfeeding.  Physical Exam  Nursing note and vitals reviewed. Constitutional: She is oriented to person, place, and time. She appears well-developed and well-nourished. No distress.  HENT:  Head: Normocephalic.  Eyes: Pupils are equal, round, and reactive to light.  Cardiovascular: Normal rate, regular rhythm and normal heart sounds.  Respiratory: Effort normal and breath sounds normal. No respiratory distress.  GI: Soft. Bowel sounds are normal. She exhibits no distension. There is no abdominal tenderness.  Genitourinary:    Genitourinary Comments: SSE: no pooling, no discharge, no vaginal bleeding.    Neurological: She is alert and oriented to person, place, and time.  Skin: Skin is warm and dry.  Psychiatric: She has a normal mood and affect. Her behavior is normal. Judgment and thought content normal.   Dilation: 1 Effacement (%):  Thick Station: Ballotable Exam by:: Ma Hillock CNM  Fetal Tracing:  Baseline: 130 Variability: moderate Accels: 15x15 Decels: none  Toco: ui with occasional uc's  MAU Course  Procedures Results for orders placed or performed during the hospital encounter of 06/16/18 (from the past 24 hour(s))  Urinalysis, Routine w reflex microscopic     Status: Abnormal   Collection Time: 06/16/18 10:10 PM  Result Value Ref Range   Color, Urine YELLOW YELLOW   APPearance CLEAR CLEAR   Specific Gravity, Urine 1.010 1.005 - 1.030   pH 7.0 5.0 - 8.0   Glucose, UA NEGATIVE NEGATIVE mg/dL   Hgb urine dipstick SMALL (A) NEGATIVE   Bilirubin Urine NEGATIVE NEGATIVE   Ketones, ur NEGATIVE NEGATIVE mg/dL   Protein, ur NEGATIVE NEGATIVE mg/dL   Nitrite NEGATIVE NEGATIVE   Leukocytes,Ua NEGATIVE NEGATIVE  Urinalysis, Microscopic (reflex)     Status: Abnormal   Collection Time: 06/16/18 10:10 PM  Result Value Ref Range   RBC / HPF 0-5 0 - 5 RBC/hpf   WBC, UA 0-5 0 - 5 WBC/hpf   Bacteria, UA FEW (A) NONE SEEN   Squamous Epithelial / LPF 0-5 0 - 5   MDM UA Fern- negative NST reactive  Patient reports feeling normal fetal movement while in MAU Cervix unchanged from previous exam, no blood on exam.  Assessment and Plan   1. Decreased fetal movements in third trimester, single or unspecified fetus   2. NST (non-stress test) reactive   3. PCB (post coital bleeding)   4. [redacted] weeks gestation of pregnancy    -Discharge home in stable condition -Preterm labor and bleeding precautions discussed. Pelvic rest encouraged -Patient advised to follow-up with [facility] on Thursday as scheduled for prenatal care. -Patient may return to MAU as needed or if her condition were to change or worsen  Rolm Bookbinder CNM 06/16/2018, 10:11 PM

## 2018-06-16 NOTE — Discharge Instructions (Signed)

## 2018-06-20 ENCOUNTER — Encounter: Payer: Federal, State, Local not specified - PPO | Admitting: Obstetrics and Gynecology

## 2018-07-01 ENCOUNTER — Ambulatory Visit (INDEPENDENT_AMBULATORY_CARE_PROVIDER_SITE_OTHER): Payer: Federal, State, Local not specified - PPO | Admitting: Advanced Practice Midwife

## 2018-07-01 ENCOUNTER — Encounter: Payer: Self-pay | Admitting: Advanced Practice Midwife

## 2018-07-01 ENCOUNTER — Encounter: Payer: Self-pay | Admitting: Family Medicine

## 2018-07-01 VITALS — BP 105/54 | HR 116 | Wt 179.0 lb

## 2018-07-01 DIAGNOSIS — Z3480 Encounter for supervision of other normal pregnancy, unspecified trimester: Secondary | ICD-10-CM | POA: Diagnosis not present

## 2018-07-01 DIAGNOSIS — Z3A36 36 weeks gestation of pregnancy: Secondary | ICD-10-CM

## 2018-07-01 DIAGNOSIS — Z113 Encounter for screening for infections with a predominantly sexual mode of transmission: Secondary | ICD-10-CM

## 2018-07-01 DIAGNOSIS — Z3483 Encounter for supervision of other normal pregnancy, third trimester: Secondary | ICD-10-CM

## 2018-07-01 NOTE — Patient Instructions (Signed)
Vaginal delivery means that you give birth by pushing your baby out of your birth canal (vagina). A team of health care providers will help you before, during, and after vaginal delivery. Birth experiences are unique for every woman and every pregnancy, and birth experiences vary depending on where you choose to give birth. What happens when I arrive at the birth center or hospital? Once you are in labor and have been admitted into the hospital or birth center, your health care provider may:  Review your pregnancy history and any concerns that you have.  Insert an IV into one of your veins. This may be used to give you fluids and medicines.  Check your blood pressure, pulse, temperature, and heart rate (vital signs).  Check whether your bag of water (amniotic sac) has broken (ruptured).  Talk with you about your birth plan and discuss pain control options. Monitoring Your health care provider may monitor your contractions (uterine monitoring) and your baby's heart rate (fetal monitoring). You may need to be monitored:  Often, but not continuously (intermittently).  All the time or for long periods at a time (continuously). Continuous monitoring may be needed if: ? You are taking certain medicines, such as medicine to relieve pain or make your contractions stronger. ? You have pregnancy or labor complications. Monitoring may be done by:  Placing a special stethoscope or a handheld monitoring device on your abdomen to check your baby's heartbeat and to check for contractions.  Placing monitors on your abdomen (external monitors) to record your baby's heartbeat and the frequency and length of contractions.  Placing monitors inside your uterus through your vagina (internal monitors) to record your baby's heartbeat and the frequency, length, and strength of your contractions. Depending on the type of monitor, it may remain in your uterus or on your baby's head until birth.  Telemetry. This is  a type of continuous monitoring that can be done with external or internal monitors. Instead of having to stay in bed, you are able to move around during telemetry. Physical exam Your health care provider may perform frequent physical exams. This may include:  Checking how and where your baby is positioned in your uterus.  Checking your cervix to determine: ? Whether it is thinning out (effacing). ? Whether it is opening up (dilating). What happens during labor and delivery?  Normal labor and delivery is divided into the following three stages: Stage 1  This is the longest stage of labor.  This stage can last for hours or days.  Throughout this stage, you will feel contractions. Contractions generally feel mild, infrequent, and irregular at first. They get stronger, more frequent (about every 2-3 minutes), and more regular as you move through this stage.  This stage ends when your cervix is completely dilated to 4 inches (10 cm) and completely effaced. Stage 2  This stage starts once your cervix is completely effaced and dilated and lasts until the delivery of your baby.  This stage may last from 20 minutes to 2 hours.  This is the stage where you will feel an urge to push your baby out of your vagina.  You may feel stretching and burning pain, especially when the widest part of your baby's head passes through the vaginal opening (crowning).  Once your baby is delivered, the umbilical cord will be clamped and cut. This usually occurs after waiting a period of 1-2 minutes after delivery.  Your baby will be placed on your bare chest (skin-to-skin contact) in   an upright position and covered with a warm blanket. Watch your baby for feeding cues, like rooting or sucking, and help the baby to your breast for his or her first feeding. Stage 3  This stage starts immediately after the birth of your baby and ends after you deliver the placenta.  This stage may take anywhere from 5 to 30  minutes.  After your baby has been delivered, you will feel contractions as your body expels the placenta and your uterus contracts to control bleeding. What can I expect after labor and delivery?  After labor is over, you and your baby will be monitored closely until you are ready to go home to ensure that you are both healthy. Your health care team will teach you how to care for yourself and your baby.  You and your baby will stay in the same room (rooming in) during your hospital stay. This will encourage early bonding and successful breastfeeding.  You may continue to receive fluids and medicines through an IV.  Your uterus will be checked and massaged regularly (fundal massage).  You will have some soreness and pain in your abdomen, vagina, and the area of skin between your vaginal opening and your anus (perineum).  If an incision was made near your vagina (episiotomy) or if you had some vaginal tearing during delivery, cold compresses may be placed on your episiotomy or your tear. This helps to reduce pain and swelling.  You may be given a squirt bottle to use instead of wiping when you go to the bathroom. To use the squirt bottle, follow these steps: ? Before you urinate, fill the squirt bottle with warm water. Do not use hot water. ? After you urinate, while you are sitting on the toilet, use the squirt bottle to rinse the area around your urethra and vaginal opening. This rinses away any urine and blood. ? Fill the squirt bottle with clean water every time you use the bathroom.  It is normal to have vaginal bleeding after delivery. Wear a sanitary pad for vaginal bleeding and discharge. Summary  Vaginal delivery means that you will give birth by pushing your baby out of your birth canal (vagina).  Your health care provider may monitor your contractions (uterine monitoring) and your baby's heart rate (fetal monitoring).  Your health care provider may perform a physical  exam.  Normal labor and delivery is divided into three stages.  After labor is over, you and your baby will be monitored closely until you are ready to go home. This information is not intended to replace advice given to you by your health care provider. Make sure you discuss any questions you have with your health care provider. Document Released: 01/25/2008 Document Revised: 05/22/2017 Document Reviewed: 05/22/2017 Elsevier Interactive Patient Education  2019 [employer] Inc.  

## 2018-07-01 NOTE — Progress Notes (Signed)
   PRENATAL VISIT NOTE  Subjective:  Andrea Foster is a 28 y.o. G2P1001 at [redacted]w[redacted]d being seen today for ongoing prenatal care.  She is currently monitored for the following issues for this low-risk pregnancy and has Short interval between pregnancies affecting pregnancy in first trimester, antepartum; Encounter for supervision of normal pregnancy; and History of gestational hypertension on their problem list.  Patient reports no complaints.  Contractions: Not present. Vag. Bleeding: None.  Movement: Present. Denies leaking of fluid. Had questions about the duration of her baby aspirin therapy.  The following portions of the patient's history were reviewed and updated as appropriate: allergies, current medications, past family history, past medical history, past social history, past surgical history and problem list. Problem list updated.  Objective:   Vitals:   07/01/18 1439  BP: (!) 105/54  Pulse: (!) 116  Weight: 81.2 kg    Fetal Status: Fetal Heart Rate (bpm): 145 Fundal Height: 36 cm Movement: Present  Presentation: Vertex  General:  Alert, oriented and cooperative. Patient is in no acute distress.  Skin: Skin is warm and dry. No rash noted.   Cardiovascular: Normal heart rate noted  Respiratory: Normal respiratory effort, no problems with respiration noted  Abdomen: Soft, gravid, appropriate for gestational age.  Pain/Pressure: Present     Pelvic: Cervical exam performed Dilation: Closed Effacement (%): Thick Station: Ballotable  Extremities: Normal range of motion.  Edema: None  Mental Status: Normal mood and affect. Normal behavior. Normal judgment and thought content.   Assessment and Plan:  Pregnancy: G2P1001 at [redacted]w[redacted]d  1. Supervision of other normal pregnancy, antepartum - GC/Chlamydia probe amp ([place])not at [facility] - Culture, beta strep (group b only) - Cervicovaginal ancillary only( [place]) - Educated on importance of aspirin therapy for prevention of  preeclampsia through 6wks postpartum  Preterm labor symptoms and general obstetric precautions including but not limited to vaginal bleeding, contractions, leaking of fluid and fetal movement were reviewed in detail with the patient. Please refer to After Visit Summary for other counseling recommendations.  Return in about 1 week (around 07/08/2018).  Future Appointments  Date Time Provider Department Center  07/08/2018  1:15 PM Armando Reichert, CNM [facility]  07/15/2018  1:15 PM Armando Reichert, CNM [facility]  07/22/2018  1:15 PM Armando Reichert, CNM [facility]  07/29/2018  1:15 PM Judeth Horn, NP [facility] WOC    Bernerd Limbo, Student-MidWife

## 2018-07-02 LAB — GC/CHLAMYDIA PROBE AMP (~~LOC~~) NOT AT ARMC
Chlamydia: NEGATIVE
Neisseria Gonorrhea: NEGATIVE

## 2018-07-04 LAB — CULTURE, BETA STREP (GROUP B ONLY): Strep Gp B Culture: NEGATIVE

## 2018-07-08 ENCOUNTER — Encounter: Payer: Federal, State, Local not specified - PPO | Admitting: Advanced Practice Midwife

## 2018-07-09 ENCOUNTER — Ambulatory Visit (INDEPENDENT_AMBULATORY_CARE_PROVIDER_SITE_OTHER): Payer: Federal, State, Local not specified - PPO | Admitting: Internal Medicine

## 2018-07-09 VITALS — BP 102/70 | HR 101 | Wt 180.0 lb

## 2018-07-09 DIAGNOSIS — Z3480 Encounter for supervision of other normal pregnancy, unspecified trimester: Secondary | ICD-10-CM

## 2018-07-09 DIAGNOSIS — Z8759 Personal history of other complications of pregnancy, childbirth and the puerperium: Secondary | ICD-10-CM

## 2018-07-09 DIAGNOSIS — Z3483 Encounter for supervision of other normal pregnancy, third trimester: Secondary | ICD-10-CM

## 2018-07-09 DIAGNOSIS — Z3A37 37 weeks gestation of pregnancy: Secondary | ICD-10-CM

## 2018-07-09 NOTE — Patient Instructions (Signed)

## 2018-07-09 NOTE — Progress Notes (Signed)
   PRENATAL VISIT NOTE  Subjective:  Andrea Foster is a 28 y.o. G2P1001 at [redacted]w[redacted]d being seen today for ongoing prenatal care.  She is currently monitored for the following issues for this low-risk pregnancy and has Short interval between pregnancies affecting pregnancy in first trimester, antepartum; Encounter for supervision of normal pregnancy; and History of gestational hypertension on their problem list.  Patient reports no complaints.  Contractions: Irritability. Vag. Bleeding: None.  Movement: Present. Denies leaking of fluid.   The following portions of the patient's history were reviewed and updated as appropriate: allergies, current medications, past family history, past medical history, past social history, past surgical history and problem list.   Objective:   Vitals:   07/09/18 1440  BP: 102/70  Pulse: (!) 101  Weight: 180 lb (81.6 kg)    Fetal Status: Fetal Heart Rate (bpm): 138 Fundal Height: 36 cm Movement: Present  Presentation: Vertex  General:  Alert, oriented and cooperative. Patient is in no acute distress.  Skin: Skin is warm and dry. No rash noted.   Cardiovascular: Normal heart rate noted  Respiratory: Normal respiratory effort, no problems with respiration noted  Abdomen: Soft, gravid, appropriate for gestational age.  Pain/Pressure: Present     Pelvic: Cervical exam deferred        Extremities: Normal range of motion.  Edema: None  Mental Status: Normal mood and affect. Normal behavior. Normal judgment and thought content.   Assessment and Plan:  Pregnancy: G2P1001 at [redacted]w[redacted]d  1. Supervision of other normal pregnancy, antepartum Continue routine PNC.  Reviewed negative GBS and GC/Chlamydia results.   2. History of gestational hypertension BP stable today. Patient asymptomatic.    Term labor symptoms and general obstetric precautions including but not limited to vaginal bleeding, contractions, leaking of fluid and fetal movement were reviewed in detail  with the patient. Please refer to After Visit Summary for other counseling recommendations.   Return in about 1 week (around 07/16/2018) for routine PNC.  Future Appointments  Date Time Provider Department Center  07/15/2018  1:15 PM Armando Reichert, CNM [facility]  07/22/2018  1:15 PM Armando Reichert, CNM [facility]  07/29/2018  1:15 PM Judeth Horn, NP [facility] WOC    De Hollingshead, DO

## 2018-07-15 ENCOUNTER — Other Ambulatory Visit: Payer: Self-pay

## 2018-07-15 ENCOUNTER — Encounter: Payer: Self-pay | Admitting: Advanced Practice Midwife

## 2018-07-15 ENCOUNTER — Ambulatory Visit (INDEPENDENT_AMBULATORY_CARE_PROVIDER_SITE_OTHER): Payer: Federal, State, Local not specified - PPO | Admitting: Advanced Practice Midwife

## 2018-07-15 VITALS — BP 110/72 | HR 93 | Wt 182.0 lb

## 2018-07-15 DIAGNOSIS — Z3483 Encounter for supervision of other normal pregnancy, third trimester: Secondary | ICD-10-CM

## 2018-07-15 DIAGNOSIS — Z3480 Encounter for supervision of other normal pregnancy, unspecified trimester: Secondary | ICD-10-CM

## 2018-07-15 DIAGNOSIS — Z3A38 38 weeks gestation of pregnancy: Secondary | ICD-10-CM

## 2018-07-15 NOTE — Patient Instructions (Signed)
Vaginal delivery means that you give birth by pushing your baby out of your birth canal (vagina). A team of health care providers will help you before, during, and after vaginal delivery. Birth experiences are unique for every woman and every pregnancy, and birth experiences vary depending on where you choose to give birth. What happens when I arrive at the birth center or hospital? Once you are in labor and have been admitted into the hospital or birth center, your health care provider may:  Review your pregnancy history and any concerns that you have.  Insert an IV into one of your veins. This may be used to give you fluids and medicines.  Check your blood pressure, pulse, temperature, and heart rate (vital signs).  Check whether your bag of water (amniotic sac) has broken (ruptured).  Talk with you about your birth plan and discuss pain control options. Monitoring Your health care provider may monitor your contractions (uterine monitoring) and your baby's heart rate (fetal monitoring). You may need to be monitored:  Often, but not continuously (intermittently).  All the time or for long periods at a time (continuously). Continuous monitoring may be needed if: ? You are taking certain medicines, such as medicine to relieve pain or make your contractions stronger. ? You have pregnancy or labor complications. Monitoring may be done by:  Placing a special stethoscope or a handheld monitoring device on your abdomen to check your baby's heartbeat and to check for contractions.  Placing monitors on your abdomen (external monitors) to record your baby's heartbeat and the frequency and length of contractions.  Placing monitors inside your uterus through your vagina (internal monitors) to record your baby's heartbeat and the frequency, length, and strength of your contractions. Depending on the type of monitor, it may remain in your uterus or on your baby's head until birth.  Telemetry. This is  a type of continuous monitoring that can be done with external or internal monitors. Instead of having to stay in bed, you are able to move around during telemetry. Physical exam Your health care provider may perform frequent physical exams. This may include:  Checking how and where your baby is positioned in your uterus.  Checking your cervix to determine: ? Whether it is thinning out (effacing). ? Whether it is opening up (dilating). What happens during labor and delivery?  Normal labor and delivery is divided into the following three stages: Stage 1  This is the longest stage of labor.  This stage can last for hours or days.  Throughout this stage, you will feel contractions. Contractions generally feel mild, infrequent, and irregular at first. They get stronger, more frequent (about every 2-3 minutes), and more regular as you move through this stage.  This stage ends when your cervix is completely dilated to 4 inches (10 cm) and completely effaced. Stage 2  This stage starts once your cervix is completely effaced and dilated and lasts until the delivery of your baby.  This stage may last from 20 minutes to 2 hours.  This is the stage where you will feel an urge to push your baby out of your vagina.  You may feel stretching and burning pain, especially when the widest part of your baby's head passes through the vaginal opening (crowning).  Once your baby is delivered, the umbilical cord will be clamped and cut. This usually occurs after waiting a period of 1-2 minutes after delivery.  Your baby will be placed on your bare chest (skin-to-skin contact) in   an upright position and covered with a warm blanket. Watch your baby for feeding cues, like rooting or sucking, and help the baby to your breast for his or her first feeding. Stage 3  This stage starts immediately after the birth of your baby and ends after you deliver the placenta.  This stage may take anywhere from 5 to 30  minutes.  After your baby has been delivered, you will feel contractions as your body expels the placenta and your uterus contracts to control bleeding. What can I expect after labor and delivery?  After labor is over, you and your baby will be monitored closely until you are ready to go home to ensure that you are both healthy. Your health care team will teach you how to care for yourself and your baby.  You and your baby will stay in the same room (rooming in) during your hospital stay. This will encourage early bonding and successful breastfeeding.  You may continue to receive fluids and medicines through an IV.  Your uterus will be checked and massaged regularly (fundal massage).  You will have some soreness and pain in your abdomen, vagina, and the area of skin between your vaginal opening and your anus (perineum).  If an incision was made near your vagina (episiotomy) or if you had some vaginal tearing during delivery, cold compresses may be placed on your episiotomy or your tear. This helps to reduce pain and swelling.  You may be given a squirt bottle to use instead of wiping when you go to the bathroom. To use the squirt bottle, follow these steps: ? Before you urinate, fill the squirt bottle with warm water. Do not use hot water. ? After you urinate, while you are sitting on the toilet, use the squirt bottle to rinse the area around your urethra and vaginal opening. This rinses away any urine and blood. ? Fill the squirt bottle with clean water every time you use the bathroom.  It is normal to have vaginal bleeding after delivery. Wear a sanitary pad for vaginal bleeding and discharge. Summary  Vaginal delivery means that you will give birth by pushing your baby out of your birth canal (vagina).  Your health care provider may monitor your contractions (uterine monitoring) and your baby's heart rate (fetal monitoring).  Your health care provider may perform a physical  exam.  Normal labor and delivery is divided into three stages.  After labor is over, you and your baby will be monitored closely until you are ready to go home. This information is not intended to replace advice given to you by your health care provider. Make sure you discuss any questions you have with your health care provider. Document Released: 01/25/2008 Document Revised: 05/22/2017 Document Reviewed: 05/22/2017 Elsevier Interactive Patient Education  2019 [employer] Inc.  

## 2018-07-15 NOTE — Progress Notes (Signed)
   PRENATAL VISIT NOTE  Subjective:  Andrea Foster is a 28 y.o. G2P1001 at [redacted]w[redacted]d being seen today for ongoing prenatal care.  She is currently monitored for the following issues for this low-risk pregnancy and has Short interval between pregnancies affecting pregnancy in first trimester, antepartum; Encounter for supervision of normal pregnancy; and History of gestational hypertension on their problem list.  Patient reports no complaints.  Contractions: Irritability. Vag. Bleeding: None.  Movement: Present. Denies leaking of fluid.   The following portions of the patient's history were reviewed and updated as appropriate: allergies, current medications, past family history, past medical history, past social history, past surgical history and problem list.   Objective:   Vitals:   07/15/18 1334  BP: 110/72  Pulse: 93  Weight: 182 lb (82.6 kg)    Fetal Status: Fetal Heart Rate (bpm): 152 Fundal Height: 38 cm Movement: Present     General:  Alert, oriented and cooperative. Patient is in no acute distress.  Skin: Skin is warm and dry. No rash noted.   Cardiovascular: Normal heart rate noted  Respiratory: Normal respiratory effort, no problems with respiration noted  Abdomen: Soft, gravid, appropriate for gestational age.  Pain/Pressure: Present     Pelvic: Cervical exam deferred        Extremities: Normal range of motion.  Edema: None  Mental Status: Normal mood and affect. Normal behavior. Normal judgment and thought content.   Assessment and Plan:  Pregnancy: G2P1001 at [redacted]w[redacted]d 1. Supervision of other normal pregnancy, antepartum - Routine care   Term labor symptoms and general obstetric precautions including but not limited to vaginal bleeding, contractions, leaking of fluid and fetal movement were reviewed in detail with the patient. Please refer to After Visit Summary for other counseling recommendations.   Return in about 1 week (around 07/22/2018).  Future Appointments   Date Time Provider Department Center  07/22/2018  1:15 PM Eugenio Hoes [facility] W[facility]  07/29/2018  1:15 PM Judeth Horn, NP [facility]   Thressa Sheller DNP, CNM  07/15/18  1:57 PM

## 2018-07-22 ENCOUNTER — Ambulatory Visit (INDEPENDENT_AMBULATORY_CARE_PROVIDER_SITE_OTHER): Payer: Federal, State, Local not specified - PPO | Admitting: Advanced Practice Midwife

## 2018-07-22 ENCOUNTER — Encounter: Payer: Self-pay | Admitting: Advanced Practice Midwife

## 2018-07-22 ENCOUNTER — Other Ambulatory Visit: Payer: Self-pay

## 2018-07-22 VITALS — BP 109/65 | HR 82 | Wt 187.0 lb

## 2018-07-22 DIAGNOSIS — Z3483 Encounter for supervision of other normal pregnancy, third trimester: Secondary | ICD-10-CM

## 2018-07-22 DIAGNOSIS — Z3A39 39 weeks gestation of pregnancy: Secondary | ICD-10-CM

## 2018-07-22 DIAGNOSIS — Z3480 Encounter for supervision of other normal pregnancy, unspecified trimester: Secondary | ICD-10-CM

## 2018-07-22 NOTE — Progress Notes (Signed)
   PRENATAL VISIT NOTE  Subjective:  Andrea Foster is a 28 y.o. G2P1001 at [redacted]w[redacted]d being seen today for ongoing prenatal care.  She is currently monitored for the following issues for this low-risk pregnancy and has Short interval between pregnancies affecting pregnancy in first trimester, antepartum; Encounter for supervision of normal pregnancy; and History of gestational hypertension on their problem list.  Patient reports no complaints.  Contractions: Irritability. Vag. Bleeding: None.  Movement: Present. Denies leaking of fluid.   The following portions of the patient's history were reviewed and updated as appropriate: allergies, current medications, past family history, past medical history, past social history, past surgical history and problem list.   Objective:   Vitals:   07/22/18 1338  BP: 109/65  Pulse: 82  Weight: 187 lb (84.8 kg)    Fetal Status: Fetal Heart Rate (bpm): 154 Fundal Height: 39 cm Movement: Present  Presentation: Vertex  General:  Alert, oriented and cooperative. Patient is in no acute distress.  Skin: Skin is warm and dry. No rash noted.   Cardiovascular: Normal heart rate noted  Respiratory: Normal respiratory effort, no problems with respiration noted  Abdomen: Soft, gravid, appropriate for gestational age.  Pain/Pressure: Present     Pelvic: Cervical exam performed Dilation: 2.5 Effacement (%): Thick Station: -2  Extremities: Normal range of motion.  Edema: None  Mental Status: Normal mood and affect. Normal behavior. Normal judgment and thought content.   Assessment and Plan:  Pregnancy: G2P1001 at [redacted]w[redacted]d 1. Supervision of other normal pregnancy, antepartum - IOL scheduled for 08/02/18  - BPP/NST at next visit if still pregnant   Term labor symptoms and general obstetric precautions including but not limited to vaginal bleeding, contractions, leaking of fluid and fetal movement were reviewed in detail with the patient. Please refer to After Visit  Summary for other counseling recommendations.   Return in about 1 week (around 07/29/2018) for with NST and BPP for post-dates .  Future Appointments  Date Time Provider Department Center  07/29/2018  1:15 PM Judeth Horn, NP [facility]  08/02/2018  7:30 AM [facility] ROOM [facility] None   Thressa Sheller DNP, CNM  07/22/18  2:02 PM

## 2018-07-23 ENCOUNTER — Inpatient Hospital Stay (HOSPITAL_COMMUNITY): Payer: Federal, State, Local not specified - PPO | Admitting: Anesthesiology

## 2018-07-23 ENCOUNTER — Encounter (HOSPITAL_COMMUNITY): Payer: Self-pay | Admitting: *Deleted

## 2018-07-23 ENCOUNTER — Other Ambulatory Visit: Payer: Self-pay

## 2018-07-23 ENCOUNTER — Inpatient Hospital Stay (HOSPITAL_COMMUNITY)
Admission: AD | Admit: 2018-07-23 | Discharge: 2018-07-24 | DRG: 807 | Disposition: A | Discharge status: Home / Self Care | Payer: Federal, State, Local not specified - PPO | Attending: Obstetrics and Gynecology | Admitting: Obstetrics and Gynecology

## 2018-07-23 DIAGNOSIS — O26893 Other specified pregnancy related conditions, third trimester: Secondary | ICD-10-CM | POA: Diagnosis not present

## 2018-07-23 DIAGNOSIS — Z3A39 39 weeks gestation of pregnancy: Secondary | ICD-10-CM

## 2018-07-23 DIAGNOSIS — O9952 Diseases of the respiratory system complicating childbirth: Principal | ICD-10-CM | POA: Diagnosis present

## 2018-07-23 DIAGNOSIS — J45909 Unspecified asthma, uncomplicated: Secondary | ICD-10-CM | POA: Diagnosis not present

## 2018-07-23 LAB — CBC
HCT: 34.7 % — ABNORMAL LOW (ref 36.0–46.0)
Hemoglobin: 10.9 g/dL — ABNORMAL LOW (ref 12.0–15.0)
MCH: 28.5 pg (ref 26.0–34.0)
MCHC: 31.4 g/dL (ref 30.0–36.0)
MCV: 90.8 fL (ref 80.0–100.0)
Platelets: 241 10*3/uL (ref 150–400)
RBC: 3.82 MIL/uL — ABNORMAL LOW (ref 3.87–5.11)
RDW: 12.6 % (ref 11.5–15.5)
WBC: 11.6 10*3/uL — ABNORMAL HIGH (ref 4.0–10.5)
nRBC: 0 % (ref 0.0–0.2)

## 2018-07-23 LAB — TYPE AND SCREEN
ABO/RH(D): O POS
Antibody Screen: NEGATIVE

## 2018-07-23 LAB — RPR: RPR Ser Ql: NONREACTIVE

## 2018-07-23 LAB — ABO/RH: ABO/RH(D): O POS

## 2018-07-23 MED ORDER — SODIUM CHLORIDE (PF) 0.9 % IJ SOLN
INTRAMUSCULAR | Status: DC | PRN
  Administered 2018-07-23: 12 mL/h via EPIDURAL

## 2018-07-23 MED ORDER — PHENYLEPHRINE 40 MCG/ML (10ML) SYRINGE FOR IV PUSH (FOR BLOOD PRESSURE SUPPORT)
80.0000 ug | PREFILLED_SYRINGE | INTRAVENOUS | Status: DC | PRN
  Filled 2018-07-23: qty 10

## 2018-07-23 MED ORDER — SIMETHICONE 80 MG PO CHEW: 80.0000 mg | CHEWABLE_TABLET | ORAL | Status: DC | PRN

## 2018-07-23 MED ORDER — LACTATED RINGERS IV SOLN: 500.0000 mL | INTRAVENOUS | Status: DC | PRN

## 2018-07-23 MED ORDER — SOD CITRATE-CITRIC ACID 500-334 MG/5ML PO SOLN: 30.0000 mL | ORAL | Status: DC | PRN

## 2018-07-23 MED ORDER — TETANUS-DIPHTH-ACELL PERTUSSIS 5-2.5-18.5 LF-MCG/0.5 IM SUSP: 0.5000 mL | Freq: Once | INTRAMUSCULAR | Status: DC

## 2018-07-23 MED ORDER — ACETAMINOPHEN 325 MG PO TABS: 650.0000 mg | ORAL_TABLET | ORAL | Status: DC | PRN

## 2018-07-23 MED ORDER — DOCUSATE SODIUM 100 MG PO CAPS
100.0000 mg | ORAL_CAPSULE | Freq: Two times a day (BID) | ORAL | Status: DC
  Administered 2018-07-23 – 2018-07-24 (×2): 100 mg via ORAL
  Filled 2018-07-23 (×2): qty 1

## 2018-07-23 MED ORDER — COCONUT OIL OIL: 1.0000 "application " | TOPICAL_OIL | Status: DC | PRN

## 2018-07-23 MED ORDER — FENTANYL-BUPIVACAINE-NACL 0.5-0.125-0.9 MG/250ML-% EP SOLN
12.0000 mL/h | EPIDURAL | Status: DC | PRN
  Filled 2018-07-23 (×2): qty 250

## 2018-07-23 MED ORDER — BENZOCAINE-MENTHOL 20-0.5 % EX AERO: 1.0000 "application " | INHALATION_SPRAY | CUTANEOUS | Status: DC | PRN

## 2018-07-23 MED ORDER — EPHEDRINE 5 MG/ML INJ: 10.0000 mg | INTRAVENOUS | Status: DC | PRN

## 2018-07-23 MED ORDER — IBUPROFEN 600 MG PO TABS
600.0000 mg | ORAL_TABLET | Freq: Four times a day (QID) | ORAL | Status: DC
  Administered 2018-07-23 – 2018-07-24 (×6): 600 mg via ORAL
  Filled 2018-07-23 (×6): qty 1

## 2018-07-23 MED ORDER — LACTATED RINGERS IV SOLN
500.0000 mL | Freq: Once | INTRAVENOUS | Status: AC
  Administered 2018-07-23: 500 mL via INTRAVENOUS

## 2018-07-23 MED ORDER — DIPHENHYDRAMINE HCL 50 MG/ML IJ SOLN: 12.5000 mg | INTRAMUSCULAR | Status: DC | PRN

## 2018-07-23 MED ORDER — OXYTOCIN BOLUS FROM INFUSION
500.0000 mL | Freq: Once | INTRAVENOUS | Status: AC
  Administered 2018-07-23: 500 mL via INTRAVENOUS

## 2018-07-23 MED ORDER — LIDOCAINE HCL (PF) 1 % IJ SOLN
INTRAMUSCULAR | Status: DC | PRN
  Administered 2018-07-23: 11 mL via EPIDURAL

## 2018-07-23 MED ORDER — WITCH HAZEL-GLYCERIN EX PADS: 1.0000 "application " | MEDICATED_PAD | CUTANEOUS | Status: DC | PRN

## 2018-07-23 MED ORDER — OXYCODONE-ACETAMINOPHEN 5-325 MG PO TABS: 1.0000 | ORAL_TABLET | ORAL | Status: DC | PRN

## 2018-07-23 MED ORDER — PRENATAL MULTIVITAMIN CH
1.0000 | ORAL_TABLET | Freq: Every day | ORAL | Status: DC
  Administered 2018-07-23 – 2018-07-24 (×2): 1 via ORAL
  Filled 2018-07-23 (×2): qty 1

## 2018-07-23 MED ORDER — LIDOCAINE HCL (PF) 1 % IJ SOLN
30.0000 mL | INTRAMUSCULAR | Status: DC | PRN
  Filled 2018-07-23: qty 30

## 2018-07-23 MED ORDER — LACTATED RINGERS IV SOLN
INTRAVENOUS | Status: DC
  Administered 2018-07-23 (×2): via INTRAVENOUS

## 2018-07-23 MED ORDER — PHENYLEPHRINE 40 MCG/ML (10ML) SYRINGE FOR IV PUSH (FOR BLOOD PRESSURE SUPPORT): 80.0000 ug | PREFILLED_SYRINGE | INTRAVENOUS | Status: DC | PRN

## 2018-07-23 MED ORDER — ALBUTEROL SULFATE (2.5 MG/3ML) 0.083% IN NEBU: 3.0000 mL | INHALATION_SOLUTION | Freq: Four times a day (QID) | RESPIRATORY_TRACT | Status: DC | PRN

## 2018-07-23 MED ORDER — OXYTOCIN 40 UNITS IN NORMAL SALINE INFUSION - SIMPLE MED
2.5000 [IU]/h | INTRAVENOUS | Status: DC
  Administered 2018-07-23: 2.5 [IU]/h via INTRAVENOUS
  Filled 2018-07-23: qty 1000

## 2018-07-23 MED ORDER — SENNOSIDES-DOCUSATE SODIUM 8.6-50 MG PO TABS
2.0000 | ORAL_TABLET | ORAL | Status: DC
  Administered 2018-07-23: 2 via ORAL
  Filled 2018-07-23: qty 2

## 2018-07-23 MED ORDER — OXYCODONE-ACETAMINOPHEN 5-325 MG PO TABS: 2.0000 | ORAL_TABLET | ORAL | Status: DC | PRN

## 2018-07-23 MED ORDER — ONDANSETRON HCL 4 MG/2ML IJ SOLN: 4.0000 mg | Freq: Four times a day (QID) | INTRAMUSCULAR | Status: DC | PRN

## 2018-07-23 MED ORDER — ONDANSETRON HCL 4 MG PO TABS: 4.0000 mg | ORAL_TABLET | ORAL | Status: DC | PRN

## 2018-07-23 MED ORDER — FLEET ENEMA 7-19 GM/118ML RE ENEM: 1.0000 | ENEMA | RECTAL | Status: DC | PRN

## 2018-07-23 MED ORDER — DIPHENHYDRAMINE HCL 25 MG PO CAPS: 25.0000 mg | ORAL_CAPSULE | Freq: Four times a day (QID) | ORAL | Status: DC | PRN

## 2018-07-23 MED ORDER — ONDANSETRON HCL 4 MG/2ML IJ SOLN: 4.0000 mg | INTRAMUSCULAR | Status: DC | PRN

## 2018-07-23 MED ORDER — DIBUCAINE 1 % RE OINT: 1.0000 "application " | TOPICAL_OINTMENT | RECTAL | Status: DC | PRN

## 2018-07-23 NOTE — Lactation Note (Signed)
This note was copied from a baby's chart. Lactation Consultation Note  Patient Name: Andrea Foster Date: 07/23/2018 Reason for consult: Initial assessment;Term;Other (Comment)(per mom has been leaking the last few months is down has formula / may switch and pump and bottle - will call if she wants to be set up with DEBP/ )  LC reviewed the benefits of breast feeding or pumping to provide EBM to baby.  LC encouraged mom to call and LC's would support her feeding preference.   LC also reviewed with mom how to dry up her milk with cold cabbage leaves until they wilt and then change cabbage leaves  Out. Also tight fitting bra .    Maternal Data Does the patient have breastfeeding experience prior to this delivery?: No  Feeding Feeding Type: Bottle Fed - Formula Nipple Type: Slow - flow  LATCH Score                   Interventions Interventions: Breast feeding basics reviewed  Lactation Tools Discussed/Used     Consult Status Consult Status: Complete    Matilde Sprang Caulin Begley 07/23/2018, 1:42 PM

## 2018-07-23 NOTE — Anesthesia Procedure Notes (Signed)
Epidural Patient location during procedure: OB Start time: 07/23/2018 1:43 AM End time: 07/23/2018 1:57 AM  Staffing Anesthesiologist: Lowella Curb, MD Performed: anesthesiologist   Preanesthetic Checklist Completed: patient identified, site marked, surgical consent, pre-op evaluation, timeout performed, IV checked, risks and benefits discussed and monitors and equipment checked  Epidural Patient position: sitting Prep: ChloraPrep Patient monitoring: heart rate, cardiac monitor, continuous pulse ox and blood pressure Approach: midline Location: L2-L3 Injection technique: LOR saline  Needle:  Needle type: Tuohy  Needle gauge: 17 G Needle length: 9 cm Needle insertion depth: 6 cm Catheter type: closed end flexible Catheter size: 20 Guage Catheter at skin depth: 10 cm Test dose: negative  Assessment Events: blood not aspirated, injection not painful, no injection resistance, negative IV test and no paresthesia  Additional Notes Reason for block:procedure for pain

## 2018-07-23 NOTE — H&P (Addendum)
OBSTETRIC ADMISSION HISTORY AND PHYSICAL  Andrea Foster is a 28 y.o. female G2P1001 with IUP at [redacted]w[redacted]d by LMP presenting for contractions. Had her membranes stripped at prenatal visit 10 hours ago. Has been feeling contractions since which have been getting stronger. Reports fetal movement. Denies LOF. Had a little spotting after stripping of membranes but no large vaginal bleeding. Reports good fetal movement. Reports this pregnancy has been uncomplicated, only med is a PNV.   She received her prenatal [facility] at [facility].  Support person in labor: FOB  Ultrasounds . Anatomy U/S: normal  Prenatal History/Complications: . Short interval between pregnancies  Past Medical History: Past Medical History:  Diagnosis Date  . Asthma    seasonal as a child  . Gonorrhea   . Hx of chlamydia infection 2013   Past Surgical History: Past Surgical History:  Procedure Laterality Date  . NO PAST SURGERIES     Obstetrical History: OB History    Gravida  2   Para  1   Term  1   Preterm  0   AB  0   Living  1     SAB  0   TAB  0   Ectopic  0   Multiple  0   Live Births  1          Social History: Social History   Socioeconomic History  . Marital status: Single    Spouse name: Not on file  . Number of children: Not on file  . Years of education: Not on file  . Highest education level: Not on file  Occupational History  . Not on file  Social Needs  . Financial resource strain: Not on file  . Food insecurity:    Worry: Not on file    Inability: Not on file  . Transportation needs:    Medical: Not on file    Non-medical: Not on file  Tobacco Use  . Smoking status: Never Smoker  . Smokeless tobacco: Never Used  Substance and Sexual Activity  . Alcohol use: No    Alcohol/week: 1.0 standard drinks    Types: 1 Shots of liquor per week    Comment: rare, spicial occasions, not while preg  . Drug use: No  . Sexual activity: Yes    Birth control/protection: None   Lifestyle  . Physical activity:    Days per week: Not on file    Minutes per session: Not on file  . Stress: Not on file  Relationships  . Social connections:    Talks on phone: Not on file    Gets together: Not on file    Attends religious service: Not on file    Active member of club or organization: Not on file    Attends meetings of clubs or organizations: Not on file    Relationship status: Not on file  Other Topics Concern  . Not on file  Social History Narrative  . Not on file   Family History: Family History  Problem Relation Age of Onset  . Cancer Maternal Grandfather        ? agent orange  . Asthma Neg Hx   . Diabetes Neg Hx   . Heart disease Neg Hx   . Hypertension Neg Hx   . Stroke Neg Hx    Allergies: Allergies  Allergen Reactions  . Latex Hives  . Other Anaphylaxis    walnuts   Medications Prior to Admission  Medication Sig Dispense Refill Last Dose  .  albuterol (PROVENTIL HFA;VENTOLIN HFA) 108 (90 Base) MCG/ACT inhaler Inhale 2 puffs into the lungs every 6 (six) hours as needed for wheezing or shortness of breath. 1 Inhaler 2 Taking  . aspirin EC 81 MG tablet Take 1 tablet (81 mg total) by mouth daily. (Patient not taking: Reported on 05/08/2018) 30 tablet 5 Not Taking  . ferrous sulfate 325 (65 FE) MG tablet Take 1 tablet (325 mg total) by mouth daily with breakfast. (Patient not taking: Reported on 06/06/2018) 30 tablet 3 Not Taking  . Prenat w/o A Vit-FeFum-FePo-FA (CONCEPT OB) 130-92.4-1 MG CAPS Take 1 capsule by mouth daily. 30 capsule 11 Taking   Review of Systems  All systems reviewed and negative except as stated in HPI  Last menstrual period 10/19/2017, not currently breastfeeding. General appearance: moderate distress during contractions Lungs: no respiratory distress Heart: regular rate  Abdomen: soft, non-tender; gravid abdomen Extremities: no LE edema Fetal monitoring: baseline 145bpm, no accels, good variability, no decels Uterine  activity: q1-21min Dilation: 6.5 Effacement (%): 90 Station: -2 Exam by:: K. WeissRN  Prenatal labs: ABO, Rh: O/Positive/-- (09/04 1031) Antibody: Negative (09/04 1031) Rubella: 2.61 (09/04 1031) RPR: Non Reactive (01/08 1503)  HBsAg: Negative (09/04 1031)  HIV: Non Reactive (01/08 1503)  GBS:   negative Glucola: normal Genetic screening:  normal  Prenatal Transfer Tool  Maternal Diabetes: No Genetic Screening: Normal Maternal Ultrasounds/Referrals: Normal Fetal Ultrasounds or other Referrals:  None Maternal Substance Abuse:  No Significant Maternal Medications:  None Significant Maternal Lab Results: None  No results found for this or any previous visit (from the past 24 hour(s)).  Patient Active Problem List   Diagnosis Date Noted  . History of gestational hypertension 01/30/2018  . Short interval between pregnancies affecting pregnancy in first trimester, antepartum 01/02/2018  . Encounter for supervision of normal pregnancy 01/02/2018   Assessment/Plan:  Andrea Foster is a 28 y.o. G2P1001 at [redacted]w[redacted]d here for SOL.  Labor: expectant management -- pain control: getting epidural now  Fetal Wellbeing: EFW 7-8lbs by Leopold[facility]. Cephalic by RN check  -- GBS negative -- continuous fetal monitoring - Cat 1   Postpartum Planning -- bottle/vasectomy -- Rh+/ given Tdap   Burman Nieves, MD Family Medicine Resident      I personally was present during the history, physical exam and medical decision-making activities of this service and have verified that the service and findings are accurately documented in the student's note.   Clayton Bibles Certified Nurse Midwife Faculty Practice 07/23/18  1:51 AM

## 2018-07-23 NOTE — Discharge Summary (Signed)
Postpartum Discharge Summary     Patient Name: Andrea Foster DOB: 09/21/1990 MRN: [id number]  Date of admission: 07/23/2018 Delivering Provider: Burman Nieves A   Date of discharge: 07/24/2018  Admitting diagnosis: pregnancy Intrauterine pregnancy: [redacted]w[redacted]d     Secondary diagnosis:  Active Problems:   Normal labor  Additional problems: None     Discharge diagnosis: Term Pregnancy Delivered                                                                                                Post partum procedures:None  Augmentation: None  Complications: None  Hospital course:  Onset of Labor With Vaginal Delivery     28 y.o. yo G2P1001 at [redacted]w[redacted]d was admitted in Active Labor on 07/23/2018. Patient had an uncomplicated labor course as follows:  Membrane Rupture Time/Date: 4:04 AM ,07/23/2018   Intrapartum Procedures: Episiotomy: None [1]                                         Lacerations:  None [1]  Patient had a delivery of a Viable infant. 07/23/2018  Information for the patient's newborn:  Saylah, Ribelin [[id number]]  Delivery Method: Vaginal, Spontaneous(Filed from Delivery Summary)    Pateint had an uncomplicated postpartum course.  She is ambulating, tolerating a regular diet, passing flatus, and urinating well. Patient is discharged home in stable condition on 07/24/18.   Magnesium Sulfate recieved: No BMZ received: No  Physical exam  Vitals:   07/23/18 0740 07/23/18 1245 07/23/18 2329 07/24/18 0500  BP: 113/68 105/77 104/74 104/75  Pulse: (!) 58 78 66 72  Resp: 18 18 18    Temp: (!) 97.4 F (36.3 C) 98.1 F (36.7 C) 97.8 F (36.6 C) 97.9 F (36.6 C)  TempSrc: Oral Oral Oral Oral  SpO2: 99% 98%    Weight:      Height:       General: alert, cooperative and no distress Lochia: appropriate Uterine Fundus: soft Incision: N/A DVT Evaluation: No evidence of DVT seen on physical exam. Labs: Lab Results  Component Value Date   WBC 11.6 (H) 07/23/2018   HGB  10.9 (L) 07/23/2018   HCT 34.7 (L) 07/23/2018   MCV 90.8 07/23/2018   PLT 241 07/23/2018   CMP Latest Ref Rng & Units 12/09/2017  Glucose 70 - 99 mg/dL 557(D)  BUN 6 - 20 mg/dL 9  Creatinine 2.20 - 2.54 mg/dL 2.70  Sodium 623 - 762 mmol/L 133(L)  Potassium 3.5 - 5.1 mmol/L 3.8  Chloride 98 - 111 mmol/L 104  CO2 22 - 32 mmol/L 21(L)  Calcium 8.9 - 10.3 mg/dL 9.0  Total Protein 6.5 - 8.1 g/dL 6.8  Total Bilirubin 0.3 - 1.2 mg/dL 0.7  Alkaline Phos 38 - 126 U/L 65  AST 15 - 41 U/L 16  ALT 0 - 44 U/L 14    Discharge instruction: per After Visit Summary and "Baby and Me Booklet".  After visit meds:  Allergies as of 07/24/2018  Reactions   Latex Hives   Other Anaphylaxis   walnuts      Medication List    STOP taking these medications   aspirin EC 81 MG tablet     TAKE these medications   albuterol 108 (90 Base) MCG/ACT inhaler Commonly known as:  PROVENTIL HFA;VENTOLIN HFA Inhale 2 puffs into the lungs every 6 (six) hours as needed for wheezing or shortness of breath.   Concept OB 130-92.4-1 MG Caps Take 1 capsule by mouth daily.   ferrous sulfate 325 (65 FE) MG tablet Take 1 tablet (325 mg total) by mouth daily with breakfast.   ibuprofen 600 MG tablet Commonly known as:  ADVIL,MOTRIN Take 1 tablet (600 mg total) by mouth every 6 (six) hours.       Diet: routine diet  Activity: Advance as tolerated. Pelvic rest for 6 weeks.   Outpatient follow up:6 weeks Follow up Appt: Future Appointments  Date Time Provider Department Center  07/29/2018  1:15 PM Judeth Horn, NP [facility]  07/29/2018  2:15 PM [facility] NST [facility]   Follow up Visit: Follow-up Information    Center for [facility]. Schedule an appointment as soon as possible for a visit in 4 week(s).   Specialty:  Obstetrics and Gynecology Contact information: [address] [place] [address] [phone]           Please schedule this patient for  Postpartum visit in: 6 weeks with the following provider: Any provider For C/S patients schedule nurse incision check in weeks 2 weeks: no Low risk pregnancy complicated by: N/A Delivery mode:  SVD Anticipated Birth Control:  partner vasectomy PP Procedures needed: No  Schedule Integrated BH visit: no      Newborn Data: Live born female  Birth Weight:   APGAR: ,   Newborn Delivery   Birth date/time:  07/23/2018 04:05:00 Delivery type:       Baby Feeding: Bottle Disposition:home with mother

## 2018-07-23 NOTE — MAU Note (Signed)
Pt had membranes stripped and has cramping since . Contractions started every 4 min for the past few hours. No leaking  Fetal movement.

## 2018-07-23 NOTE — Anesthesia Preprocedure Evaluation (Signed)
Anesthesia Evaluation  Patient identified by MRN, date of birth, ID band Patient awake    Reviewed: Allergy & Precautions, NPO status , Patient's Chart, lab work & pertinent test results  Airway Mallampati: II  TM Distance: >3 FB Neck ROM: Full    Dental no notable dental hx. (+) Dental Advisory Given   Pulmonary asthma ,  Seasonal asthma as a child   Pulmonary exam normal breath sounds clear to auscultation       Cardiovascular negative cardio ROS Normal cardiovascular exam Rhythm:Regular Rate:Normal     Neuro/Psych negative neurological ROS  negative psych ROS   GI/Hepatic negative GI ROS, Neg liver ROS,   Endo/Other  negative endocrine ROS  Renal/GU negative Renal ROS  negative genitourinary   Musculoskeletal negative musculoskeletal ROS (+)   Abdominal   Peds  Hematology  (+) anemia ,   Anesthesia Other Findings   Reproductive/Obstetrics (+) Pregnancy                             Anesthesia Physical  Anesthesia Plan  ASA: II  Anesthesia Plan: Epidural   Post-op Pain Management:    Induction:   PONV Risk Score and Plan:   Airway Management Planned: Natural Airway  Additional Equipment:   Intra-op Plan:   Post-operative Plan:   Informed Consent: I have reviewed the patients History and Physical, chart, labs and discussed the procedure including the risks, benefits and alternatives for the proposed anesthesia with the patient or authorized representative who has indicated his/her understanding and acceptance.       Plan Discussed with:   Anesthesia Plan Comments: (Labs reviewed. Platelets acceptable, patient not taking any blood thinning medications. Risks and benefits discussed with patient, patient expressed understanding and wished to proceed.)        Anesthesia Quick Evaluation

## 2018-07-24 DIAGNOSIS — Z3A39 39 weeks gestation of pregnancy: Secondary | ICD-10-CM | POA: Diagnosis not present

## 2018-07-24 MED ORDER — IBUPROFEN 600 MG PO TABS: 600.0000 mg | ORAL_TABLET | Freq: Four times a day (QID) | ORAL | 0 refills | Status: DC

## 2018-07-24 NOTE — Anesthesia Postprocedure Evaluation (Signed)
Anesthesia Post Note  Patient: Amarya Staudinger  Procedure(s) Performed: AN AD HOC LABOR EPIDURAL     Patient location during evaluation: Mother Baby Anesthesia Type: Epidural Level of consciousness: awake and alert and oriented Pain management: satisfactory to patient Vital Signs Assessment: post-procedure vital signs reviewed and stable Respiratory status: respiratory function stable Cardiovascular status: stable Postop Assessment: no headache, no backache, epidural receding, patient able to bend at knees, no signs of nausea or vomiting and adequate PO intake Anesthetic complications: no    Last Vitals:  Vitals:   07/23/18 2329 07/24/18 0500  BP: 104/74 104/75  Pulse: 66 72  Resp: 18   Temp: 36.6 C 36.6 C  SpO2:      Last Pain:  Vitals:   07/24/18 0913  TempSrc:   PainSc: 0-No pain   Pain Goal:                   Caren Garske

## 2018-07-24 NOTE — Discharge Instructions (Signed)
Vaginal Delivery, Care After °Refer to this sheet in the next few weeks. These instructions provide you with information about caring for yourself after vaginal delivery. Your health care provider may also give you more specific instructions. Your treatment has been planned according to current medical practices, but problems sometimes occur. Call your health care provider if you have any problems or questions. °What can I expect after the procedure? °After vaginal delivery, it is common to have: °· Some bleeding from your vagina. °· Soreness in your abdomen, your vagina, and the area of skin between your vaginal opening and your anus (perineum). °· Pelvic cramps. °· Fatigue. °Follow these instructions at home: °Medicines °· Take over-the-counter and prescription medicines only as told by your health care provider. °· If you were prescribed an antibiotic medicine, take it as told by your health care provider. Do not stop taking the antibiotic until it is finished. °Driving ° °· Do not drive or operate heavy machinery while taking prescription pain medicine. °· Do not drive for 24 hours if you received a sedative. °Lifestyle °· Do not drink alcohol. This is especially important if you are breastfeeding or taking medicine to relieve pain. °· Do not use tobacco products, including cigarettes, chewing tobacco, or e-cigarettes. If you need help quitting, ask your health care provider. °Eating and drinking °· Drink at least 8 eight-ounce glasses of water every day unless you are told not to by your health care provider. If you choose to breastfeed your baby, you may need to drink more water than this. °· Eat high-fiber foods every day. These foods may help prevent or relieve constipation. High-fiber foods include: °? Whole grain cereals and breads. °? Brown rice. °? Beans. °? Fresh fruits and vegetables. °Activity °· Return to your normal activities as told by your health care provider. Ask your health care provider what  activities are safe for you. °· Rest as much as possible. Try to rest or take a nap when your baby is sleeping. °· Do not lift anything that is heavier than your baby or 10 lb (4.5 kg) until your health care provider says that it is safe. °· Talk with your health care provider about when you can engage in sexual activity. This may depend on your: °? Risk of infection. °? Rate of healing. °? Comfort and desire to engage in sexual activity. °Vaginal Care °· If you have an episiotomy or a vaginal tear, check the area every day for signs of infection. Check for: °? More redness, swelling, or pain. °? More fluid or blood. °? Warmth. °? Pus or a bad smell. °· Do not use tampons or douches until your health care provider says this is safe. °· Watch for any blood clots that may pass from your vagina. These may look like clumps of dark red, brown, or black discharge. °General instructions °· Keep your perineum clean and dry as told by your health care provider. °· Wear loose, comfortable clothing. °· Wipe from front to back when you use the toilet. °· Ask your health care provider if you can shower or take a bath. If you had an episiotomy or a perineal tear during labor and delivery, your health care provider may tell you not to take baths for a certain length of time. °· Wear a bra that supports your breasts and fits you well. °· If possible, have someone help you with household activities and help care for your baby for at least a few days after you   leave the hospital. °· Keep all follow-up visits for you and your baby as told by your health care provider. This is important. °Contact a health care provider if: °· You have: °? Vaginal discharge that has a bad smell. °? Difficulty urinating. °? Pain when urinating. °? A sudden increase or decrease in the frequency of your bowel movements. °? More redness, swelling, or pain around your episiotomy or vaginal tear. °? More fluid or blood coming from your episiotomy or vaginal  tear. °? Pus or a bad smell coming from your episiotomy or vaginal tear. °? A fever. °? A rash. °? Little or no interest in activities you used to enjoy. °? Questions about caring for yourself or your baby. °· Your episiotomy or vaginal tear feels warm to the touch. °· Your episiotomy or vaginal tear is separating or does not appear to be healing. °· Your breasts are painful, hard, or turn red. °· You feel unusually sad or worried. °· You feel nauseous or you vomit. °· You pass large blood clots from your vagina. If you pass a blood clot from your vagina, save it to show to your health care provider. Do not flush blood clots down the toilet without having your health care provider look at them. °· You urinate more than usual. °· You are dizzy or light-headed. °· You have not breastfed at all and you have not had a menstrual period for 12 weeks after delivery. °· You have stopped breastfeeding and you have not had a menstrual period for 12 weeks after you stopped breastfeeding. °Get help right away if: °· You have: °? Pain that does not go away or does not get better with medicine. °? Chest pain. °? Difficulty breathing. °? Blurred vision or spots in your vision. °? Thoughts about hurting yourself or your baby. °· You develop pain in your abdomen or in one of your legs. °· You develop a severe headache. °· You faint. °· You bleed from your vagina so much that you fill two sanitary pads in one hour. °This information is not intended to replace advice given to you by your health care provider. Make sure you discuss any questions you have with your health care provider. °Document Released: 04/14/2000 Document Revised: 09/29/2015 Document Reviewed: 05/02/2015 °Elsevier Interactive Patient Education © 2019 [employer] ° °

## 2018-07-29 ENCOUNTER — Other Ambulatory Visit: Payer: Federal, State, Local not specified - PPO

## 2018-07-29 ENCOUNTER — Encounter: Payer: Federal, State, Local not specified - PPO | Admitting: Student

## 2018-08-02 ENCOUNTER — Inpatient Hospital Stay (HOSPITAL_COMMUNITY): Payer: Federal, State, Local not specified - PPO

## 2018-08-20 ENCOUNTER — Telehealth: Payer: Self-pay | Admitting: Emergency Medicine

## 2018-08-20 ENCOUNTER — Ambulatory Visit: Payer: Medicaid Other | Admitting: Advanced Practice Midwife

## 2018-08-20 NOTE — Telephone Encounter (Signed)
Called pt to start telehealth visit x2. LVM informing pt that we were attempting to start telehealth visit and she can give [place] a call at [phone].

## 2018-08-21 ENCOUNTER — Ambulatory Visit: Payer: Federal, State, Local not specified - PPO | Admitting: Obstetrics & Gynecology

## 2018-10-03 ENCOUNTER — Encounter (HOSPITAL_COMMUNITY): Payer: Self-pay

## 2018-10-16 ENCOUNTER — Telehealth: Payer: Self-pay | Admitting: Family Medicine

## 2018-10-16 NOTE — Telephone Encounter (Signed)
The patient called in stating she experienced a lot of itching last week and purchased an [place]. She stated a day after completing she still felt itchy and would like to be checked. She also stated she missed her virtual postpartum and would like to reschedule. Informed the patient of how to access the my chart visit and rescheduled the patient.

## 2018-10-17 ENCOUNTER — Other Ambulatory Visit: Payer: Self-pay

## 2018-10-17 ENCOUNTER — Telehealth (INDEPENDENT_AMBULATORY_CARE_PROVIDER_SITE_OTHER): Payer: Medicaid Other | Admitting: Obstetrics and Gynecology

## 2018-10-17 NOTE — Progress Notes (Signed)
TELEHEALTH VIRTUAL POSTPARTUM VISIT ENCOUNTER NOTE  I connected with@ on 10/17/18 at  9:55 AM EDT by telephone at home and verified that I am speaking with the correct person using two identifiers.   I discussed the limitations, risks, security and privacy concerns of performing an evaluation and management service by telephone and the availability of in person appointments. I also discussed with the patient that there may be a patient responsible charge related to this service. The patient expressed understanding and agreed to proceed.  Appointment Date: 10/17/2018  [facility]: [facility]   Chief Complaint:  Chief Complaint  Patient presents with  . Postpartum Care    History of Present Illness: NOBIE ALLEYNE is a 28 y.o. African-American [id number] (No LMP recorded.), seen for the above chief complaint. Her past medical history is significant for Asthma.    She is s/p vaginal delivery on 39 and 4 weeks; she was discharged to home on 09/25/7822 Pregnancy complicated by Asthma. Baby is doing well.   Complains of Yeast infection, no improvement with OTC medications.   Vaginal bleeding or discharge: Yes  Mode of feeding infant: Bottle Intercourse: Yes  Contraception: no method PP depression s/s: No .  Any bowel or bladder issues: No  Pap smear: no abnormalities (date: 2018)  Review of Systems: Her 12 point review of systems is negative or as noted in the History of Present Illness.  Patient Active Problem List   Diagnosis Date Noted  . Normal labor 07/23/2018  . History of gestational hypertension 01/30/2018  . Short interval between pregnancies affecting pregnancy in first trimester, antepartum 01/02/2018  . Encounter for supervision of normal pregnancy 01/02/2018    Medications Illona C. Killings had no medications administered during this visit. Current Outpatient Medications  Medication Sig Dispense Refill  . albuterol (PROVENTIL HFA;VENTOLIN HFA) 108 (90 Base) MCG/ACT  inhaler Inhale 2 puffs into the lungs every 6 (six) hours as needed for wheezing or shortness of breath. 1 Inhaler 2  . Prenat w/o A Vit-FeFum-FePo-FA (CONCEPT OB) 130-92.4-1 MG CAPS Take 1 capsule by mouth daily. 30 capsule 11  . ferrous sulfate 325 (65 FE) MG tablet Take 1 tablet (325 mg total) by mouth daily with breakfast. 30 tablet 3  . ibuprofen (ADVIL,MOTRIN) 600 MG tablet Take 1 tablet (600 mg total) by mouth every 6 (six) hours. (Patient not taking: Reported on 10/17/2018) 30 tablet 0   No current facility-administered medications for this visit.     Allergies Latex and Other  Physical Exam:  General:  Alert, oriented and cooperative.   Mental Status: Normal mood and affect perceived. Normal judgment and thought content.  Rest of physical exam deferred due to type of encounter  PP Depression Screening:   Edinburgh Postnatal Depression Scale - 10/17/18 1006      Edinburgh Postnatal Depression Scale:  In the Past 7 Days   I have been able to laugh and see the funny side of things.  0    I have looked forward with enjoyment to things.  0    I have blamed myself unnecessarily when things went wrong.  0    I have been anxious or worried for no good reason.  0    I have felt scared or panicky for no good reason.  0    Things have been getting on top of me.  0    I have been so unhappy that I have had difficulty sleeping.  0    I  have felt sad or miserable.  0    I have been so unhappy that I have been crying.  0    The thought of harming myself has occurred to me.  0    Edinburgh Postnatal Depression Scale Total  0       Assessment:Patient is a 28 y.o. [id number] who is 12 weeks postpartum from a vaginal delivery.  She is doing well.   Plan:  [facility]ing well [facility] currently Considering [facility]. Sexually active without protection. She is scheduled for a visit next week for vaginal swabs. Will let us know what type of BC she wants. She will need a pregnancy test at that visit.   RTC  6/23 as scheduled.   I discussed the assessment and treatment plan with the patient. The patient was provided an opportunity to ask questions and all were answered. The patient agreed with the plan and demonstrated an understanding of the instructions.   The patient was advised to call back or seek an in-person evaluation/go to the ED for any concerning postpartum symptoms.  I provided 11 minutes of non-face-to-face time during this encounter.   Venia CarbonJennifer Sung Parodi, NP [facility]

## 2018-10-17 NOTE — Progress Notes (Signed)
I connected with  Andrea Foster on 10/17/18 at  9:55 AM EDT by telephone and verified that I am speaking with the correct person using two identifiers.   I discussed the limitations, risks, security and privacy concerns of performing an evaluation and management service by telephone and the availability of in person appointments. I also discussed with the patient that there may be a patient responsible charge related to this service. The patient expressed understanding and agreed to proceed.  [place], CMA 10/17/2018  10:05 AM   NVD / 89months/epidural Bottle Dory Horn good start) No BC Urine stool okay Back active sexually Bleeding is abnormal edinburgh normal

## 2018-10-22 ENCOUNTER — Ambulatory Visit: Payer: Medicaid Other

## 2018-10-28 ENCOUNTER — Telehealth: Payer: Self-pay | Admitting: Family Medicine

## 2018-10-28 NOTE — Telephone Encounter (Signed)
Attempted to call patient about her appointment on 6/30 @ 1:30. No answer, left voicemail instructing patient to wear a face mask for the entire appointment. Patient instructed that no visitors will be allowed with her during the appointment. Patient was not screened due to no answer.

## 2018-10-29 ENCOUNTER — Ambulatory Visit: Payer: Medicaid Other

## 2018-12-31 IMAGING — US US MFM OB COMP +14 WKS
1 series · 14 of 28 positions shown · non-contrast
Comparison: none

[Series 1: us mfm ob comp +14 wks · 14 of 84 slices shown]
[im 4/84]
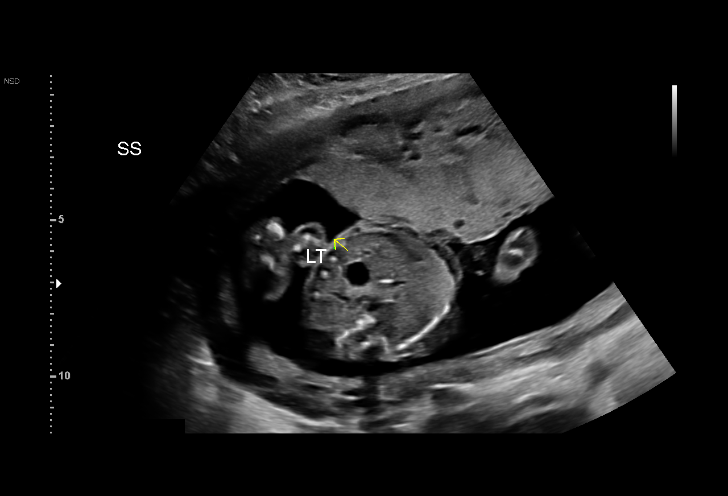
[im 10/84]
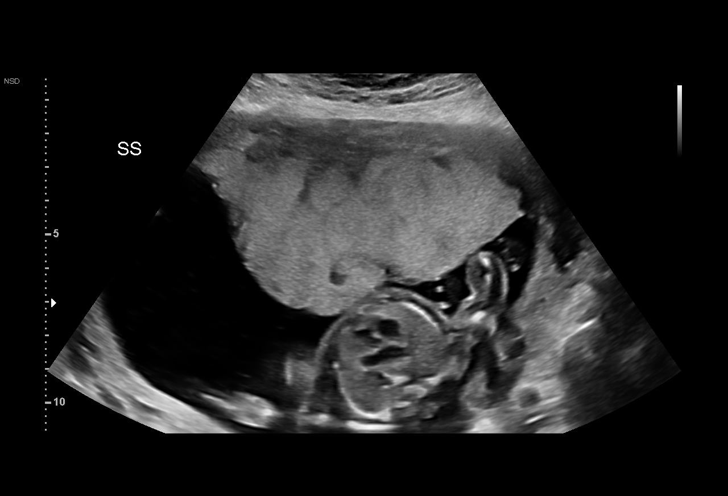
[im 16/84]
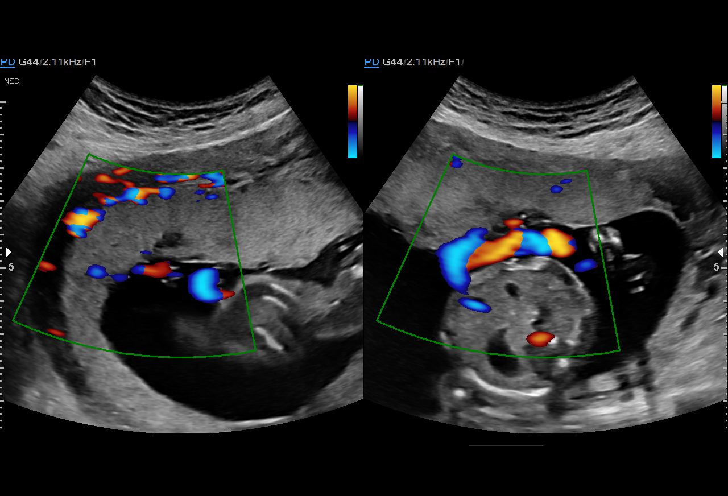
[im 22/84]
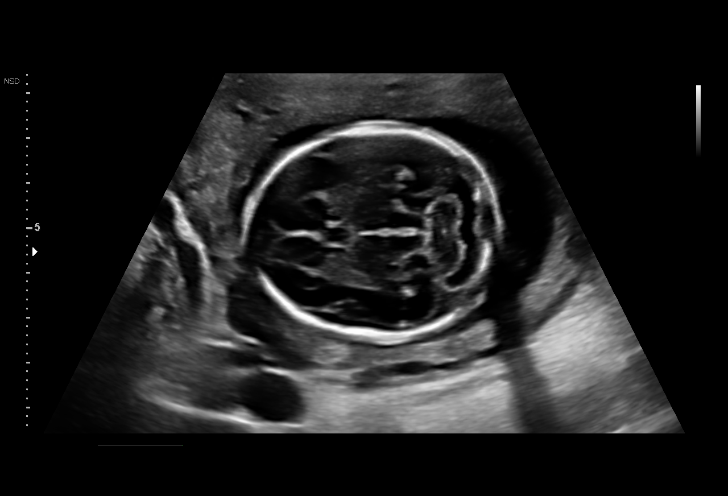
[im 28/84]
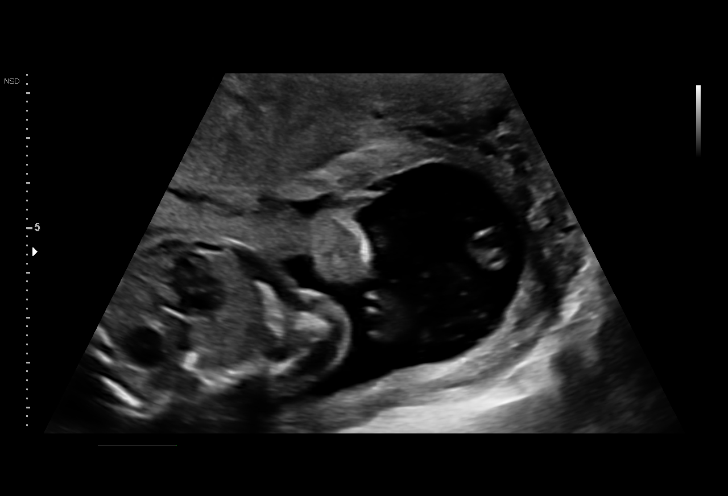
[im 34/84]
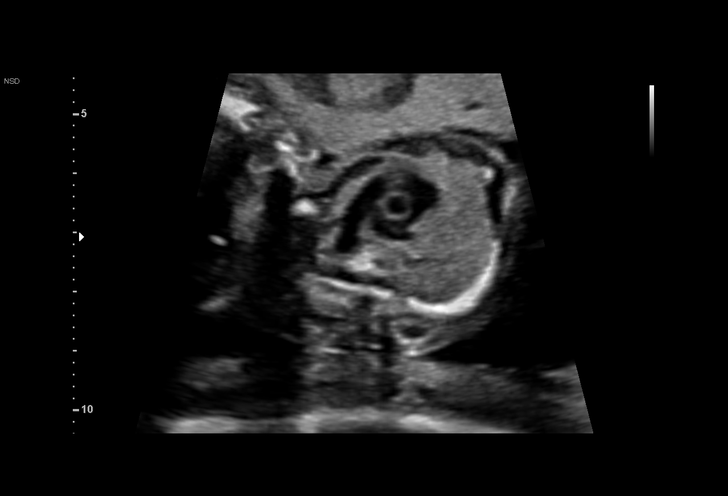
[im 40/84]
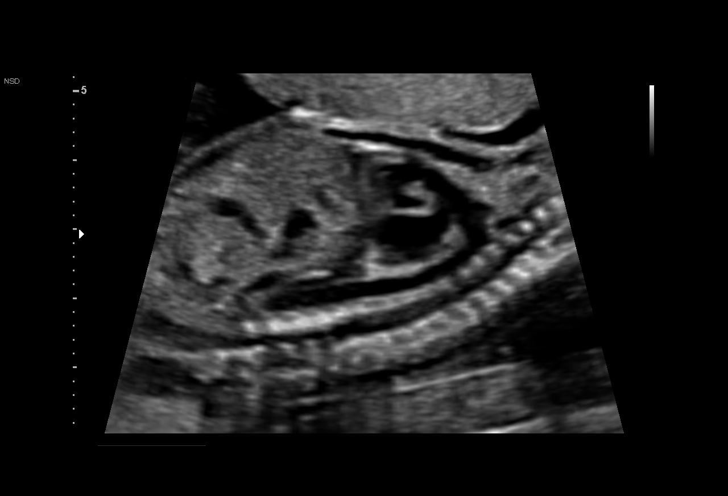
[im 47/84]
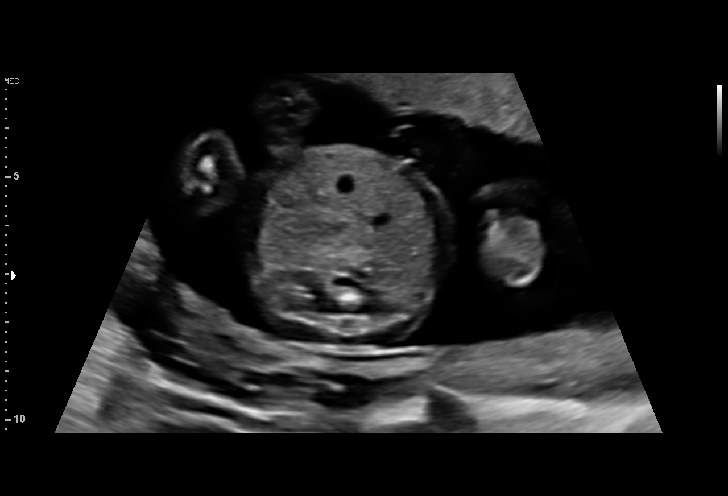
[im 53/84]
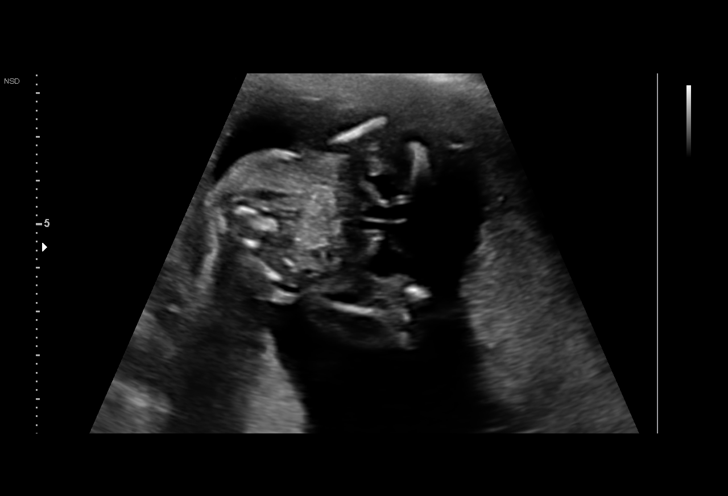
[im 59/84]
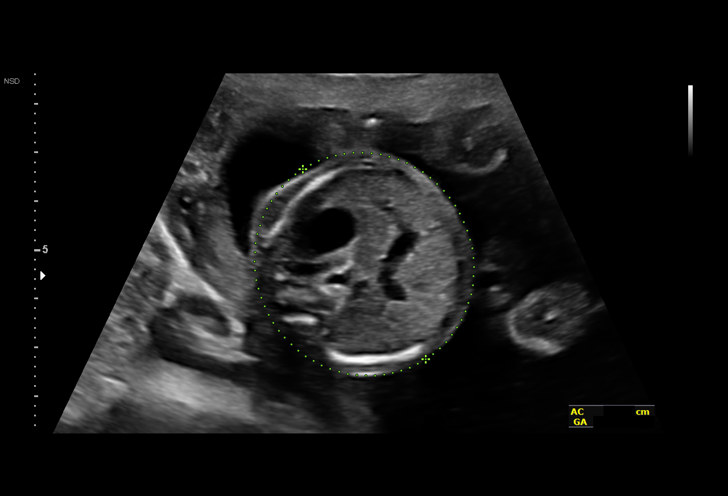
[im 65/84]
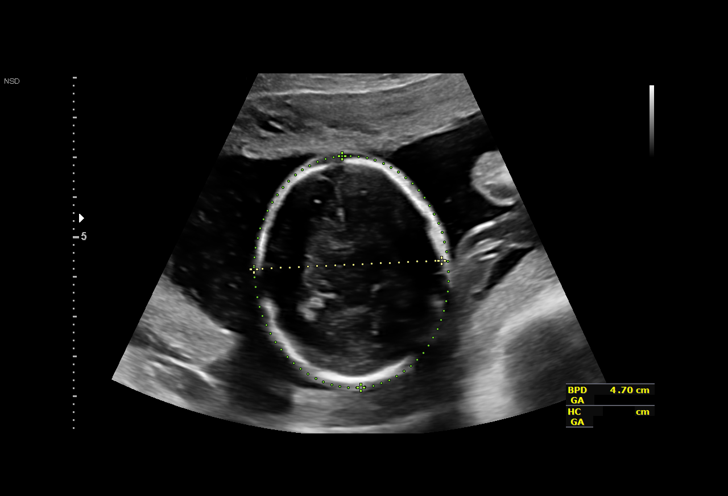
[im 71/84]
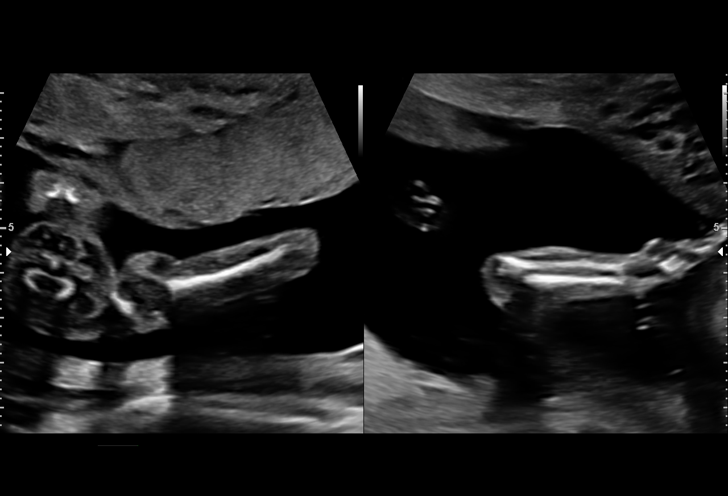
[im 77/84]
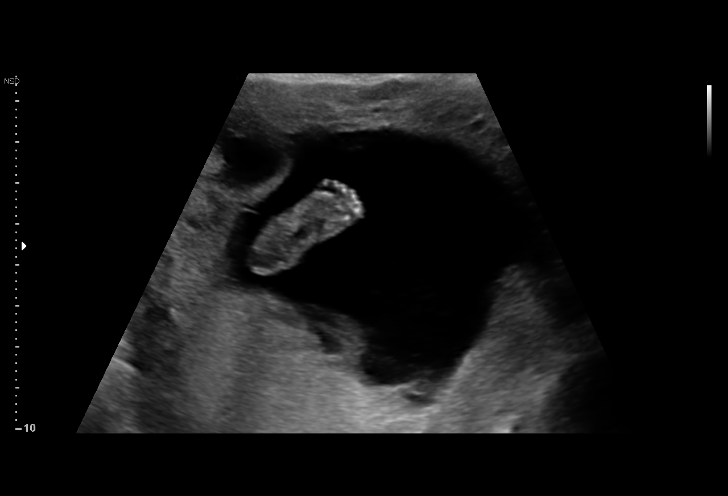
[im 84/84]
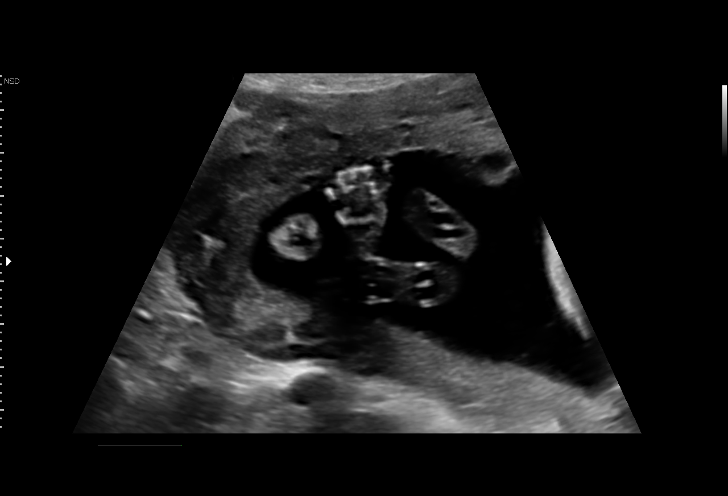

[14 of 28 positions shown; findings below may reference images not displayed]

OB/Gyn Clinic

1  US MFM OB COMP + 14 WK               76805.01     [facility]

Indications

19 weeks gestation of pregnancy
Encounter for antenatal screening for
malformations
Fetal Evaluation

Num Of Fetuses:          1
Fetal Heart Rate(bpm):   144
Cardiac Activity:        Observed
Presentation:            Cephalic
Placenta:                Anterior Fundal
P. Cord Insertion:       Visualized

Amniotic Fluid
AFI FV:      Within normal limits

Largest Pocket(cm)
6.7
Biometry

BPD:      46.4  mm     G. Age:  20w 0d         88  %    CI:            76  %    70 - 86
FL/HC:       17.2  %    16.1 -
HC:      168.7  mm     G. Age:  19w 4d         67  %    HC/AC:       1.16       1.09 -
AC:       146   mm     G. Age:  19w 6d         75  %    FL/BPD:      62.7  %
FL:       29.1  mm     G. Age:  19w 0d         42  %    FL/AC:       19.9  %    20 - 24
HUM:      27.7  mm     G. Age:  18w 6d         47  %
CER:      20.3  mm     G. Age:  19w 2d         57  %
NFT:       3.4  mm
LV:        5.1  mm
CM:        5.3  mm

Est. FW:     295   gm   0 lb 10 oz      53  %
OB History

Gravidity:    2         Term:   1        Prem:   0        SAB:   0
TOP:          0       Ectopic:  0        Living: 1
Gestational Age

Clinical EDD:  19w 0d                                        EDD:   07/26/18
U/S Today:     19w 4d                                        EDD:   07/22/18
Best:          19w 0d     Det. By:  Clinical EDD             EDD:   07/26/18
Anatomy

Cranium:               Appears normal         LVOT:                   Appears normal
Cavum:                 Appears normal         Aortic Arch:            Appears normal
Ventricles:            Appears normal         Ductal Arch:            Appears normal
Choroid Plexus:        Appears normal         Diaphragm:              Appears normal
Cerebellum:            Appears normal         Stomach:                Appears normal, left
sided
Posterior Fossa:       Appears normal         Abdomen:                Appears normal
Nuchal Fold:           Appears normal         Abdominal Wall:         Appears nml (cord
insert, abd wall)
Face:                  Appears normal         Cord Vessels:           Appears normal (3
(orbits and profile)                           vessel cord)
Lips:                  Appears normal         Kidneys:                Appear normal
Palate:                Appears normal         Bladder:                Appears normal
Thoracic:              Appears normal         Spine:                  Not well visualized
Heart:                 Appears normal         Upper Extremities:      Visualized
(4CH, axis, and situs
RVOT:                  Appears normal         Lower Extremities:      Visualized

Other:  Fetus appears to be a male. Heels visualized.
Cervix Uterus Adnexa

Cervix
Length:           3.43  cm.
Normal appearance by transabdominal scan.

Left Ovary
Not visualized.

Right Ovary
Within normal limits.

Adnexa
No abnormality visualized.
Impression

Normal interval growth.  No ultrasonic evidence of structural
fetal anomalies.
Recommendations
Follow up in 4 weeks to complete fetal anatomy.

## 2019-10-30 DIAGNOSIS — Z419 Encounter for procedure for purposes other than remedying health state, unspecified: Secondary | ICD-10-CM | POA: Diagnosis not present

## 2019-11-03 ENCOUNTER — Encounter (HOSPITAL_COMMUNITY): Payer: Self-pay | Admitting: Obstetrics & Gynecology

## 2019-11-03 ENCOUNTER — Other Ambulatory Visit: Payer: Self-pay

## 2019-11-03 ENCOUNTER — Inpatient Hospital Stay (HOSPITAL_COMMUNITY)
Admission: AD | Admit: 2019-11-03 | Discharge: 2019-11-03 | Disposition: A | Discharge status: Home / Self Care | Payer: Medicaid Other | Attending: Obstetrics & Gynecology | Admitting: Obstetrics & Gynecology

## 2019-11-03 DIAGNOSIS — Z3A01 Less than 8 weeks gestation of pregnancy: Secondary | ICD-10-CM | POA: Diagnosis not present

## 2019-11-03 DIAGNOSIS — J45909 Unspecified asthma, uncomplicated: Secondary | ICD-10-CM | POA: Diagnosis not present

## 2019-11-03 DIAGNOSIS — O99511 Diseases of the respiratory system complicating pregnancy, first trimester: Secondary | ICD-10-CM | POA: Insufficient documentation

## 2019-11-03 DIAGNOSIS — O26891 Other specified pregnancy related conditions, first trimester: Secondary | ICD-10-CM

## 2019-11-03 DIAGNOSIS — B373 Candidiasis of vulva and vagina: Secondary | ICD-10-CM | POA: Diagnosis not present

## 2019-11-03 DIAGNOSIS — B3731 Acute candidiasis of vulva and vagina: Secondary | ICD-10-CM

## 2019-11-03 DIAGNOSIS — Z791 Long term (current) use of non-steroidal anti-inflammatories (NSAID): Secondary | ICD-10-CM | POA: Diagnosis not present

## 2019-11-03 DIAGNOSIS — O98811 Other maternal infectious and parasitic diseases complicating pregnancy, first trimester: Secondary | ICD-10-CM | POA: Diagnosis not present

## 2019-11-03 DIAGNOSIS — O99891 Other specified diseases and conditions complicating pregnancy: Secondary | ICD-10-CM

## 2019-11-03 DIAGNOSIS — Z79899 Other long term (current) drug therapy: Secondary | ICD-10-CM | POA: Insufficient documentation

## 2019-11-03 DIAGNOSIS — Z3201 Encounter for pregnancy test, result positive: Secondary | ICD-10-CM

## 2019-11-03 LAB — WET PREP, GENITAL
Clue Cells Wet Prep HPF POC: NONE SEEN
Sperm: NONE SEEN
Trich, Wet Prep: NONE SEEN

## 2019-11-03 LAB — URINALYSIS, ROUTINE W REFLEX MICROSCOPIC
Bilirubin Urine: NEGATIVE
Glucose, UA: NEGATIVE mg/dL
Hgb urine dipstick: NEGATIVE
Ketones, ur: NEGATIVE mg/dL
Leukocytes,Ua: NEGATIVE
Nitrite: NEGATIVE
Protein, ur: NEGATIVE mg/dL
Specific Gravity, Urine: 1.02 (ref 1.005–1.030)
pH: 6 (ref 5.0–8.0)

## 2019-11-03 LAB — POCT PREGNANCY, URINE: Preg Test, Ur: POSITIVE — AB

## 2019-11-03 MED ORDER — TERCONAZOLE 0.4 % VA CREA: 1.0000 | TOPICAL_CREAM | Freq: Every day | VAGINAL | 0 refills | Status: AC

## 2019-11-03 NOTE — Discharge Instructions (Signed)
Vaginal Yeast Infection, Adult  Vaginal yeast infection is a condition that causes vaginal discharge as well as soreness, swelling, and redness (inflammation) of the vagina. This is a common condition. Some women get this infection frequently. What are the causes? This condition is caused by a change in the normal balance of the yeast (candida) and bacteria that live in the vagina. This change causes an overgrowth of yeast, which causes the inflammation. What increases the risk? The condition is more likely to develop in women who:  Take antibiotic medicines.  Have diabetes.  Take birth control pills.  Are pregnant.  Douche often.  Have a weak body defense system (immune system).  Have been taking steroid medicines for a long time.  Frequently wear tight clothing. What are the signs or symptoms? Symptoms of this condition include:  White, thick, creamy vaginal discharge.  Swelling, itching, redness, and irritation of the vagina. The lips of the vagina (vulva) may be affected as well.  Pain or a burning feeling while urinating.  Pain during sex. How is this diagnosed? This condition is diagnosed based on:  Your medical history.  A physical exam.  A pelvic exam. Your health care provider will examine a sample of your vaginal discharge under a microscope. Your health care provider may send this sample for testing to confirm the diagnosis. How is this treated? This condition is treated with medicine. Medicines may be over-the-counter or prescription. You may be told to use one or more of the following:  Medicine that is taken by mouth (orally).  Medicine that is applied as a cream (topically).  Medicine that is inserted directly into the vagina (suppository). Follow these instructions at home:  Lifestyle  Do not have sex until your health care provider approves. Tell your sex partner that you have a yeast infection. That person should go to his or her health care  provider and ask if they should also be treated.  Do not wear tight clothes, such as pantyhose or tight pants.  Wear breathable cotton underwear. General instructions  Take or apply over-the-counter and prescription medicines only as told by your health care provider.  Eat more yogurt. This may help to keep your yeast infection from returning.  Do not use tampons until your health care provider approves.  Try taking a sitz bath to help with discomfort. This is a warm water bath that is taken while you are sitting down. The water should only come up to your hips and should cover your buttocks. Do this 3-4 times per day or as told by your health care provider.  Do not douche.  If you have diabetes, keep your blood sugar levels under control.  Keep all follow-up visits as told by your health care provider. This is important. Contact a health care provider if:  You have a fever.  Your symptoms go away and then return.  Your symptoms do not get better with treatment.  Your symptoms get worse.  You have new symptoms.  You develop blisters in or around your vagina.  You have blood coming from your vagina and it is not your menstrual period.  You develop pain in your abdomen. Summary  Vaginal yeast infection is a condition that causes discharge as well as soreness, swelling, and redness (inflammation) of the vagina.  This condition is treated with medicine. Medicines may be over-the-counter or prescription.  Take or apply over-the-counter and prescription medicines only as told by your health care provider.  Do not douche.  Do not have sex or use tampons until your health care provider approves.  Contact a health care provider if your symptoms do not get better with treatment or your symptoms go away and then return. This information is not intended to replace advice given to you by your health care provider. Make sure you discuss any questions you have with your health care  provider. Document Revised: 11/15/2018 Document Reviewed: 09/03/2017 Elsevier Patient Education  2020 [employer].                        Safe Medications in Pregnancy    Acne: Benzoyl Peroxide Salicylic Acid  Backache/Headache: Tylenol: 2 regular strength every 4 hours OR              2 Extra strength every 6 hours  Colds/Coughs/Allergies: Benadryl (alcohol free) 25 mg every 6 hours as needed Breath right strips Claritin Cepacol throat lozenges Chloraseptic throat spray Cold-Eeze- up to three times per day Cough drops, alcohol free Flonase (by prescription only) Guaifenesin Mucinex Robitussin DM (plain only, alcohol free) Saline nasal spray/drops Sudafed (pseudoephedrine) & Actifed ** use only after [redacted] weeks gestation and if you do not have high blood pressure Tylenol Vicks Vaporub Zinc lozenges Zyrtec   Constipation: Colace Ducolax suppositories Fleet enema Glycerin suppositories Metamucil Milk of magnesia Miralax Senokot Smooth move tea  Diarrhea: Kaopectate Imodium A-D  *NO pepto Bismol  Hemorrhoids: Anusol Anusol HC Preparation H Tucks  Indigestion: Tums Maalox Mylanta Zantac  Pepcid  Insomnia: Benadryl (alcohol free) 25mg  every 6 hours as needed Tylenol PM Unisom, no Gelcaps  Leg Cramps: Tums MagGel  Nausea/Vomiting:  Bonine Dramamine Emetrol Ginger extract Sea bands Meclizine  Nausea medication to take during pregnancy:  Unisom (doxylamine succinate 25 mg tablets) Take one tablet daily at bedtime. If symptoms are not adequately controlled, the dose can be increased to a maximum recommended dose of two tablets daily (1/2 tablet in the morning, 1/2 tablet mid-afternoon and one at bedtime). Vitamin B6 100mg  tablets. Take one tablet twice a day (up to 200 mg per day).  Skin Rashes: Aveeno products Benadryl cream or 25mg  every 6 hours as needed Calamine Lotion 1% cortisone cream  Yeast infection: Gyne-lotrimin  7 Monistat 7   **If taking multiple medications, please check labels to avoid duplicating the same active ingredients **take medication as directed on the label ** Do not exceed 4000 mg of tylenol in 24 hours **Do not take medications that contain aspirin or ibuprofen           First Trimester of Pregnancy The first trimester of pregnancy is from week 1 until the end of week 13 (months 1 through 3). A week after a sperm fertilizes an egg, the egg will implant on the wall of the uterus. This embryo will begin to develop into a baby. Genes from you and your partner will form the baby. The female genes will determine whether the baby will be a boy or a girl. At 6-8 weeks, the eyes and face will be formed, and the heartbeat can be seen on ultrasound. At the end of 12 weeks, all the baby's organs will be formed. Now that you are pregnant, you will want to do everything you can to have a healthy baby. Two of the most important things are to get good prenatal care and to follow your health care provider's instructions. Prenatal care is all the medical care you receive before the baby's birth. This care  will help prevent, find, and treat any problems during the pregnancy and childbirth. Body changes during your first trimester Your body goes through many changes during pregnancy. The changes vary from woman to woman.  You may gain or lose a couple of pounds at first.  You may feel sick to your stomach (nauseous) and you may throw up (vomit). If the vomiting is uncontrollable, call your health care provider.  You may tire easily.  You may develop headaches that can be relieved by medicines. All medicines should be approved by your health care provider.  You may urinate more often. Painful urination may mean you have a bladder infection.  You may develop heartburn as a result of your pregnancy.  You may develop constipation because certain hormones are causing the muscles that push stool  through your intestines to slow down.  You may develop hemorrhoids or swollen veins (varicose veins).  Your breasts may begin to grow larger and become tender. Your nipples may stick out more, and the tissue that surrounds them (areola) may become darker.  Your gums may bleed and may be sensitive to brushing and flossing.  Dark spots or blotches (chloasma, mask of pregnancy) may develop on your face. This will likely fade after the baby is born.  Your menstrual periods will stop.  You may have a loss of appetite.  You may develop cravings for certain kinds of food.  You may have changes in your emotions from day to day, such as being excited to be pregnant or being concerned that something may go wrong with the pregnancy and baby.  You may have more vivid and strange dreams.  You may have changes in your hair. These can include thickening of your hair, rapid growth, and changes in texture. Some women also have hair loss during or after pregnancy, or hair that feels dry or thin. Your hair will most likely return to normal after your baby is born. What to expect at prenatal visits During a routine prenatal visit:  You will be weighed to make sure you and the baby are growing normally.  Your blood pressure will be taken.  Your abdomen will be measured to track your baby's growth.  The fetal heartbeat will be listened to between weeks 10 and 14 of your pregnancy.  Test results from any previous visits will be discussed. Your health care provider may ask you:  How you are feeling.  If you are feeling the baby move.  If you have had any abnormal symptoms, such as leaking fluid, bleeding, severe headaches, or abdominal cramping.  If you are using any tobacco products, including cigarettes, chewing tobacco, and electronic cigarettes.  If you have any questions. Other tests that may be performed during your first trimester include:  Blood tests to find your blood type and to  check for the presence of any previous infections. The tests will also be used to check for low iron levels (anemia) and protein on red blood cells (Rh antibodies). Depending on your risk factors, or if you previously had diabetes during pregnancy, you may have tests to check for high blood sugar that affects pregnant women (gestational diabetes).  Urine tests to check for infections, diabetes, or protein in the urine.  An ultrasound to confirm the proper growth and development of the baby.  Fetal screens for spinal cord problems (spina bifida) and Down syndrome.  HIV (human immunodeficiency virus) testing. Routine prenatal testing includes screening for HIV, unless you choose not to have  this test.  You may need other tests to make sure you and the baby are doing well. Follow these instructions at home: Medicines  Follow your health care provider's instructions regarding medicine use. Specific medicines may be either safe or unsafe to take during pregnancy.  Take a prenatal vitamin that contains at least 600 micrograms (mcg) of folic acid.  If you develop constipation, try taking a stool softener if your health care provider approves. Eating and drinking   Eat a balanced diet that includes fresh fruits and vegetables, whole grains, good sources of protein such as meat, eggs, or tofu, and low-fat dairy. Your health care provider will help you determine the amount of weight gain that is right for you.  Avoid raw meat and uncooked cheese. These carry germs that can cause birth defects in the baby.  Eating four or five small meals rather than three large meals a day may help relieve nausea and vomiting. If you start to feel nauseous, eating a few soda crackers can be helpful. Drinking liquids between meals, instead of during meals, also seems to help ease nausea and vomiting.  Limit foods that are high in fat and processed sugars, such as fried and sweet foods.  To prevent  constipation: ? Eat foods that are high in fiber, such as fresh fruits and vegetables, whole grains, and beans. ? Drink enough fluid to keep your urine clear or pale yellow. Activity  Exercise only as directed by your health care provider. Most women can continue their usual exercise routine during pregnancy. Try to exercise for 30 minutes at least 5 days a week. Exercising will help you: ? Control your weight. ? Stay in shape. ? Be prepared for labor and delivery.  Experiencing pain or cramping in the lower abdomen or lower back is a good sign that you should stop exercising. Check with your health care provider before continuing with normal exercises.  Try to avoid standing for long periods of time. Move your legs often if you must stand in one place for a long time.  Avoid heavy lifting.  Wear low-heeled shoes and practice good posture.  You may continue to have sex unless your health care provider tells you not to. Relieving pain and discomfort  Wear a good support bra to relieve breast tenderness.  Take warm sitz baths to soothe any pain or discomfort caused by hemorrhoids. Use hemorrhoid cream if your health care provider approves.  Rest with your legs elevated if you have leg cramps or low back pain.  If you develop varicose veins in your legs, wear support hose. Elevate your feet for 15 minutes, 3-4 times a day. Limit salt in your diet. Prenatal care  Schedule your prenatal visits by the twelfth week of pregnancy. They are usually scheduled monthly at first, then more often in the last 2 months before delivery.  Write down your questions. Take them to your prenatal visits.  Keep all your prenatal visits as told by your health care provider. This is important. Safety  Wear your seat belt at all times when driving.  Make a list of emergency phone numbers, including numbers for family, friends, the hospital, and police and fire departments. General instructions  Ask  your health care provider for a referral to a local prenatal education class. Begin classes no later than the beginning of month 6 of your pregnancy.  Ask for help if you have counseling or nutritional needs during pregnancy. Your health care provider can offer advice  or refer you to specialists for help with various needs.  Do not use hot tubs, steam rooms, or saunas.  Do not douche or use tampons or scented sanitary pads.  Do not cross your legs for long periods of time.  Avoid cat litter boxes and soil used by cats. These carry germs that can cause birth defects in the baby and possibly loss of the fetus by miscarriage or stillbirth.  Avoid all smoking, herbs, alcohol, and medicines not prescribed by your health care provider. Chemicals in these products affect the formation and growth of the baby.  Do not use any products that contain nicotine or tobacco, such as cigarettes and e-cigarettes. If you need help quitting, ask your health care provider. You may receive counseling support and other resources to help you quit.  Schedule a dentist appointment. At home, brush your teeth with a soft toothbrush and be gentle when you floss. Contact a health care provider if:  You have dizziness.  You have mild pelvic cramps, pelvic pressure, or nagging pain in the abdominal area.  You have persistent nausea, vomiting, or diarrhea.  You have a bad smelling vaginal discharge.  You have pain when you urinate.  You notice increased swelling in your face, hands, legs, or ankles.  You are exposed to fifth disease or chickenpox.  You are exposed to [place] measles (rubella) and have never had it. Get help right away if:  You have a fever.  You are leaking fluid from your vagina.  You have spotting or bleeding from your vagina.  You have severe abdominal cramping or pain.  You have rapid weight gain or loss.  You vomit blood or material that looks like coffee grounds.  You develop a  severe headache.  You have shortness of breath.  You have any kind of trauma, such as from a fall or a car accident. Summary  The first trimester of pregnancy is from week 1 until the end of week 13 (months 1 through 3).  Your body goes through many changes during pregnancy. The changes vary from woman to woman.  You will have routine prenatal visits. During those visits, your health care provider will examine you, discuss any test results you may have, and talk with you about how you are feeling. This information is not intended to replace advice given to you by your health care provider. Make sure you discuss any questions you have with your health care provider. Document Revised: 03/30/2017 Document Reviewed: 03/29/2016 Elsevier Patient Education  2020 [employer]

## 2019-11-03 NOTE — MAU Provider Note (Signed)
History     CSN: [id number]  Arrival date and time: 11/03/19 4097   First Provider Initiated Contact with Patient 11/03/19 2045      Chief Complaint  Patient presents with  . Vaginal Discharge   [name] is a 29 y.o. G3P2002 at [redacted]w[redacted]d who presents to MAU for vaginal discharge.  Onset: 3 months ago Location: vagina Duration: every 3 months for a few days before her cycle Character: "felt like I was getting a yeast infection," itching, green-yellow discharge Aggravating/Associated: none/none Relieving: none Treatment: Terazol OTC  Pt denies VB, LOF, vaginal discharge/odor/itching. Pt denies N/V, abdominal pain, constipation, diarrhea, or urinary problems. Pt denies fever, chills, fatigue, sweating or changes in appetite. Pt denies SOB or chest pain. Pt denies dizziness, HA, light-headedness, weakness.  Problems this pregnancy include: pt has not yet been seen. Allergies? Latex, walnuts Current medications/supplements? Multi-vitamins Prenatal care provider? [facility] visit 11/27/2019   OB History    Gravida  3   Para  2   Term  2   Preterm  0   AB  0   Living  2     SAB  0   TAB  0   Ectopic  0   Multiple  0   Live Births  2           Past Medical History:  Diagnosis Date  . Asthma    seasonal as a child  . Gonorrhea   . Hx of chlamydia infection 2013    Past Surgical History:  Procedure Laterality Date  . NO PAST SURGERIES      Family History  Problem Relation Age of Onset  . Cancer Maternal Grandfather        ? agent orange  . Asthma Neg Hx   . Diabetes Neg Hx   . Heart disease Neg Hx   . Hypertension Neg Hx   . Stroke Neg Hx     Social History   Tobacco Use  . Smoking status: Never Smoker  . Smokeless tobacco: Never Used  Vaping Use  . Vaping Use: Never used  Substance Use Topics  . Alcohol use: No    Alcohol/week: 1.0 standard drink    Types: 1 Shots of liquor per week    Comment: rare, spicial occasions, not  while preg  . Drug use: No    Allergies:  Allergies  Allergen Reactions  . Latex Hives  . Other Anaphylaxis    walnuts    Medications Prior to Admission  Medication Sig Dispense Refill Last Dose  . Prenat w/o A Vit-FeFum-FePo-FA (CONCEPT OB) 130-92.4-1 MG CAPS Take 1 capsule by mouth daily. 30 capsule 11 11/03/2019 at Unknown time  . albuterol (PROVENTIL HFA;VENTOLIN HFA) 108 (90 Base) MCG/ACT inhaler Inhale 2 puffs into the lungs every 6 (six) hours as needed for wheezing or shortness of breath. 1 Inhaler 2   . ferrous sulfate 325 (65 FE) MG tablet Take 1 tablet (325 mg total) by mouth daily with breakfast. 30 tablet 3   . ibuprofen (ADVIL,MOTRIN) 600 MG tablet Take 1 tablet (600 mg total) by mouth every 6 (six) hours. (Patient not taking: Reported on 10/17/2018) 30 tablet 0     Review of Systems  Constitutional: Negative for chills, diaphoresis, fatigue and fever.  Eyes: Negative for visual disturbance.  Respiratory: Negative for shortness of breath.   Cardiovascular: Negative for chest pain.  Gastrointestinal: Negative for abdominal pain, constipation, diarrhea, nausea and vomiting.  Genitourinary: Positive for  vaginal discharge (yellow-green) and vaginal pain (pt describes as itching). Negative for dysuria, flank pain, frequency, pelvic pain, urgency and vaginal bleeding.  Neurological: Negative for dizziness, weakness, light-headedness and headaches.   Physical Exam   Blood pressure (!) 102/56, pulse 73, temperature 98.5 F (36.9 C), temperature source Oral, resp. rate 20, height 5\' 3"  (1.6 m), weight 70.7 kg, last menstrual period 09/29/2019, unknown if currently breastfeeding.  Patient Vitals for the past 24 hrs:  BP Temp Temp src Pulse Resp Height Weight  11/03/19 1942 (!) 102/56 98.5 F (36.9 C) Oral 73 20 5\' 3"  (1.6 m) 70.7 kg   Physical Exam Constitutional:      General: She is not in acute distress.    Appearance: Normal appearance. She is not ill-appearing,  toxic-appearing or diaphoretic.  HENT:     Head: Normocephalic and atraumatic.  Pulmonary:     Effort: Pulmonary effort is normal.  Neurological:     Mental Status: She is alert and oriented to person, place, and time.  Psychiatric:        Mood and Affect: Mood normal.        Behavior: Behavior normal.        Thought Content: Thought content normal.        Judgment: Judgment normal.    Results for orders placed or performed during the hospital encounter of 11/03/19 (from the past 24 hour(s))  Urinalysis, Routine w reflex microscopic     Status: None   Collection Time: 11/03/19  7:50 PM  Result Value Ref Range   Color, Urine YELLOW YELLOW   APPearance CLEAR CLEAR   Specific Gravity, Urine 1.020 1.005 - 1.030   pH 6.0 5.0 - 8.0   Glucose, UA NEGATIVE NEGATIVE mg/dL   Hgb urine dipstick NEGATIVE NEGATIVE   Bilirubin Urine NEGATIVE NEGATIVE   Ketones, ur NEGATIVE NEGATIVE mg/dL   Protein, ur NEGATIVE NEGATIVE mg/dL   Nitrite NEGATIVE NEGATIVE   Leukocytes,Ua NEGATIVE NEGATIVE  Pregnancy, urine POC     Status: Abnormal   Collection Time: 11/03/19  7:51 PM  Result Value Ref Range   Preg Test, Ur POSITIVE (A) NEGATIVE  Wet prep, genital     Status: Abnormal   Collection Time: 11/03/19  8:56 PM   Specimen: Vaginal  Result Value Ref Range   Yeast Wet Prep HPF POC PRESENT (A) NONE SEEN   Trich, Wet Prep NONE SEEN NONE SEEN   Clue Cells Wet Prep HPF POC NONE SEEN NONE SEEN   WBC, Wet Prep HPF POC MANY (A) NONE SEEN   Sperm NONE SEEN     MAU Course  Procedures  MDM -vaginal discharge x80months -UA: WNL -WetPrep: +yeast -GC/CT collected -pt discharged to home in stable condition  Orders Placed This Encounter  Procedures  . Wet prep, genital    Standing Status:   Standing    Number of Occurrences:   1  . Urinalysis, Routine w reflex microscopic    Standing Status:   Standing    Number of Occurrences:   1  . Pregnancy, urine POC    Standing Status:   Standing    Number  of Occurrences:   1  . Discharge patient    Order Specific Question:   Discharge disposition    Answer:   01-Home or Self Care [1]    Order Specific Question:   Discharge patient date    Answer:   11/03/2019    Assessment and Plan   1. Vaginal discharge during  pregnancy in first trimester   2. Vaginal yeast infection   3. Pregnancy test positive     Allergies as of 11/03/2019      Reactions   Latex Hives   Other Anaphylaxis   walnuts      Medication List    STOP taking these medications   ibuprofen 600 MG tablet Commonly known as: ADVIL     TAKE these medications   albuterol 108 (90 Base) MCG/ACT inhaler Commonly known as: VENTOLIN HFA Inhale 2 puffs into the lungs every 6 (six) hours as needed for wheezing or shortness of breath.   Concept OB 130-92.4-1 MG Caps Take 1 capsule by mouth daily.   ferrous sulfate 325 (65 FE) MG tablet Take 1 tablet (325 mg total) by mouth daily with breakfast.   terconazole 0.4 % vaginal cream Commonly known as: TERAZOL 7 Place 1 applicator vaginally at bedtime for 7 days.       -will call with culture results, if positive -message sent to [facility] to change appointment type from GYN to [facility] -return MAU precautions given -pt discharged to home in stable condition  Joni Reining E Glynis Hunsucker 11/03/2019, 9:50 PM

## 2019-11-03 NOTE — MAU Note (Signed)
PT SAYS SHE TOOK HPT -LAST Monday - POSITIVE . SHE MADE AN APPOINTMENT WITH CLINIC FOR 7-27-  FOR SHE THINKS YEAST INFECTION- HAS GREENISH -YELLOW  D/C.      HAS BEEN USING OTC MEDS .HAS HX OF UMBILICAL HERNIA- HURTS NOW. NO LOWER ABD PAIN.

## 2019-11-04 LAB — GC/CHLAMYDIA PROBE AMP (~~LOC~~) NOT AT ARMC
Chlamydia: NEGATIVE
Comment: NEGATIVE
Comment: NORMAL
Neisseria Gonorrhea: NEGATIVE

## 2019-11-27 ENCOUNTER — Ambulatory Visit: Payer: Medicaid Other | Admitting: Obstetrics and Gynecology

## 2019-11-30 DIAGNOSIS — Z419 Encounter for procedure for purposes other than remedying health state, unspecified: Secondary | ICD-10-CM | POA: Diagnosis not present

## 2019-12-11 ENCOUNTER — Other Ambulatory Visit: Payer: Self-pay

## 2019-12-11 ENCOUNTER — Other Ambulatory Visit (HOSPITAL_COMMUNITY)
Admission: RE | Admit: 2019-12-11 | Discharge: 2019-12-11 | Disposition: A | Discharge status: Home / Self Care | Payer: Medicaid Other | Source: Ambulatory Visit

## 2019-12-11 ENCOUNTER — Ambulatory Visit (INDEPENDENT_AMBULATORY_CARE_PROVIDER_SITE_OTHER): Payer: Medicaid Other

## 2019-12-11 VITALS — BP 110/72 | HR 75 | Wt 156.4 lb

## 2019-12-11 DIAGNOSIS — Z3480 Encounter for supervision of other normal pregnancy, unspecified trimester: Secondary | ICD-10-CM | POA: Diagnosis not present

## 2019-12-11 DIAGNOSIS — Z8759 Personal history of other complications of pregnancy, childbirth and the puerperium: Secondary | ICD-10-CM

## 2019-12-11 DIAGNOSIS — O09891 Supervision of other high risk pregnancies, first trimester: Secondary | ICD-10-CM

## 2019-12-11 DIAGNOSIS — Z3A10 10 weeks gestation of pregnancy: Secondary | ICD-10-CM

## 2019-12-11 DIAGNOSIS — Z124 Encounter for screening for malignant neoplasm of cervix: Secondary | ICD-10-CM

## 2019-12-11 DIAGNOSIS — Z31430 Encounter of female for testing for genetic disease carrier status for procreative management: Secondary | ICD-10-CM | POA: Diagnosis not present

## 2019-12-11 MED ORDER — BLOOD PRESSURE MONITORING DEVI: 1.0000 | 0 refills | Status: DC

## 2019-12-11 MED ORDER — PRENATAL ADULT GUMMY/DHA/FA 0.4-25 MG PO CHEW
1.0000 [IU] | CHEWABLE_TABLET | Freq: Every day | ORAL | 2 refills | Status: DC
Start: 2019-12-11 — End: 2020-01-09

## 2019-12-11 NOTE — Patient Instructions (Signed)
Bilateral Salpingo-Oophorectomy  Bilateral salpingo-oophorectomy is the surgical removal of both fallopian tubes and both ovaries. The ovaries are reproductive organs that produce eggs in women. The fallopian tubes allow eggs to move from the ovaries to the uterus. You may need this procedure if you:  Have had your uterus removed. This procedure is usually done after the uterus is removed.  Have cancer of the fallopian tubes or ovaries.  Have a high risk of cancer of the fallopian tubes or ovaries. There are three different techniques that can be used for this procedure:  Open. One large incision will be made in your abdomen.  Laparoscopic. A thin, lighted tube with a small camera on the end (laparoscope) will be used to help perform the procedure. The laparoscope will allow your surgeon to make several small incisions in the abdomen instead of one large incision.  Robot-assisted. A computer will be used to control surgical instruments that are attached to robotic arms. A laparoscope may also be used with this technique. As a result of this procedure, you will become sterile (unable to become pregnant), and you will go into menopause (no longer able to have menstrual periods). You may develop symptoms of menopause such as hot flashes, night sweats, and mood changes. Your sex drive may also be affected. Tell a health care provider about:  Any allergies you have.  All medicines you are taking, including vitamins, herbs, eye drops, creams, and over-the-counter medicines.  Any problems you or family members have had with anesthetic medicines.  Any blood disorders you have.  Any surgeries you have had.  Any medical conditions you have.  Whether you are pregnant or may be pregnant. What are the risks? Generally, this is a safe procedure. However, problems may occur, including:  Infection.  Bleeding.  Allergic reactions to medicines.  Damage to other structures or organs.  Blood  clots in the legs or lungs. What happens before the procedure? Staying hydrated Follow instructions from your health care provider about hydration, which may include:  Up to 2 hours before the procedure - you may continue to drink clear liquids, such as water, clear fruit juice, black coffee, and plain tea.  Eating and drinking restrictions Follow instructions from your health care provider about eating and drinking, which may include:  8 hours before the procedure - stop eating heavy meals or foods such as meat, fried foods, or fatty foods.  6 hours before the procedure - stop eating light meals or foods, such as toast or cereal.  6 hours before the procedure - stop drinking milk or drinks that contain milk.  2 hours before the procedure - stop drinking clear liquids. Medicines  Ask your health care provider about: ? Changing or stopping your regular medicines. This is especially important if you are taking diabetes medicines or blood thinners. ? Taking medicines such as aspirin and ibuprofen. These medicines can thin your blood. Do not take these medicines before your procedure if your health care provider instructs you not to.  You may be given antibiotic medicine to help prevent infection. General instructions  Do not smoke for at least 2 weeks before your procedure or as told by your health care provider.  You may have an exam or testing.  You may have a blood or urine sample taken.  Ask your health care provider how your surgical site will be marked or identified.  Plan to have someone take you home from the hospital.  If you will be going  home right after the procedure, plan to have someone with you for 24 hours. What happens during the procedure?  To reduce your risk of infection: ? Your health care team will wash or sanitize their hands. ? Your skin will be washed with soap. ? Hair may be removed from the surgical area.  An IV tube will be inserted into one of  your veins.  You will be given one or more of the following: ? A medicine to help you relax (sedative). ? A medicine to make you fall asleep (general anesthetic).  A thin tube (catheter) will be inserted through your urethra and into your bladder. The catheter drains urine during your procedure.  Depending on the type of surgery you are having, your surgeon will do one of the following: ? Make one incision in your abdomen (open surgery). ? Make two small incisions in your abdomen (laparoscopic surgery). The laparoscope will be passed through one incision, and surgical instruments will be passed through the other. ? Make several small incisions in your abdomen (robot-assisted surgery). A laparoscope and other surgical instruments may be passed through the incisions.  Your fallopian tubes and ovaries will be cut away from the uterus and removed.  Your blood vessels will be clamped and tied to prevent too much bleeding.  The incision(s) in your abdomen will be closed with stitches (sutures) or staples.  A bandage (dressing) may be placed over your incision(s). The procedure may vary among health care providers and hospitals. What happens after the procedure?  Your blood pressure, heart rate, breathing rate, and blood oxygen level will be monitored until the medicines you were given have worn off.  You may continue to receive fluids and medicines through an IV tube.  You may continue to have a catheter draining your urine.  You may have to wear compression stockings. These stockings help to prevent blood clots and reduce swelling in your legs.  You will be given pain medicine as needed.  Do not drive for 24 hours if you received a sedative. Summary  Bilateral salpingo-oophorectomy is a procedure to remove both fallopian tubes and both ovaries.  There are three different techniques that can be used for this procedure, including open, laparoscopic, and robotic. Talk with your health  care provider about how your procedure will be done.  As a result of this procedure, you will become sterile and you will go into menopause.  Plan to have someone take you home from the hospital. This information is not intended to replace advice given to you by your health care provider. Make sure you discuss any questions you have with your health care provider. Document Revised: 06/21/2018 Document Reviewed: 05/22/2016 Elsevier Patient Education  2020 [employer].

## 2019-12-11 NOTE — Progress Notes (Signed)
Medicaid Home Form Completed-12/11/19.

## 2019-12-11 NOTE — Progress Notes (Signed)
Subjective:   Andrea Foster is a 29 y.o. G3P2002 at [redacted]w[redacted]d by [employer] LMP of Sep 29, 2019 who is being seen today for her first obstetrical visit.    Gynecological/Obstetrical History: Her obstetrical history is significant for GHTN and Mild Polyhydramnios . Patient does not intend to breast feed. Pregnancy history fully reviewed. Patient reports no complaints.  Sexual Activity and Vaginal Concerns: Patient denies pain or discomfort with sex.  She also denies vaginal concerns.   Medical History/ROS: None  Social History: Patient denies current usage of tobacco, alcohol, or drugs.  Patient reports the FOB is [name] Mango who is present and supportive.  Patient reports that she lives with boyfriend and two children and endorses safety at home.  Patient denies DV/A. Patient is currently employed at [employer].   HISTORY: OB History  Gravida Para Term Preterm AB Living  3 2 2  0 0 2  SAB TAB Ectopic Multiple Live Births  0 0 0 0 2    # Outcome Date GA Lbr Len/2nd Weight Sex Delivery Anes PTL Lv  3 Current           2 Term 07/23/18 [redacted]w[redacted]d 08:53 / 00:12 7 lb 14.8 oz (3.595 kg) M Vag-Spont EPI  LIV     Name: YAMILETH, HAYSE     Apgar1: 8  Apgar5: 9  1 Term 04/26/17 [redacted]w[redacted]d 13:32 / 00:27 8 lb 4.5 oz (3.756 kg) F Vag-Spont EPI  LIV     Birth Comments: caput     Name: EYVA, CALIFANO     Apgar1: 8  Apgar5: 9    Last pap smear was done 2018 and was normal  Past Medical History:  Diagnosis Date  . Asthma    seasonal as a child  . Gonorrhea   . Hx of chlamydia infection 2013   Past Surgical History:  Procedure Laterality Date  . NO PAST SURGERIES     Family History  Problem Relation Age of Onset  . Cancer Maternal Grandfather        ? agent orange  . Asthma Neg Hx   . Diabetes Neg Hx   . Heart disease Neg Hx   . Hypertension Neg Hx   . Stroke Neg Hx    Social History   Tobacco Use  . Smoking status: Never Smoker  . Smokeless tobacco: Never Used  Vaping Use   . Vaping Use: Never used  Substance Use Topics  . Alcohol use: No    Alcohol/week: 1.0 standard drink    Types: 1 Shots of liquor per week    Comment: rare, spicial occasions, not while preg  . Drug use: No   Allergies  Allergen Reactions  . Latex Hives  . Other Anaphylaxis    walnuts   Current Outpatient Medications on File Prior to Visit  Medication Sig Dispense Refill  . albuterol (PROVENTIL HFA;VENTOLIN HFA) 108 (90 Base) MCG/ACT inhaler Inhale 2 puffs into the lungs every 6 (six) hours as needed for wheezing or shortness of breath. 1 Inhaler 2  . Prenat w/o A Vit-FeFum-FePo-FA (CONCEPT OB) 130-92.4-1 MG CAPS Take 1 capsule by mouth daily. 30 capsule 11  . ferrous sulfate 325 (65 FE) MG tablet Take 1 tablet (325 mg total) by mouth daily with breakfast. 30 tablet 3   No current facility-administered medications on file prior to visit.    Review of Systems Pertinent items noted in HPI and remainder of comprehensive ROS otherwise negative.  Exam   Vitals:  12/11/19 1032  BP: 110/72  Pulse: 75  Weight: 156 lb 6.4 oz (70.9 kg)   Fetal Heart Rate (bpm): 164  Physical Exam Constitutional:      Appearance: Normal appearance.  Genitourinary:     Genitourinary Comments: Speculum Exam: -Normal External Genitalia: Non tender, no apparent discharge at introitus.  -Vaginal Vault: Pink mucosa with good rugae. Small amt thin white discharge - -Cervix:Pink, no lesions, cysts, or polyps.  Appears closed. No active bleeding from os- -Bimanual Exam: Closed. Uterine size appropriate for dates.      HENT:     Head: Normocephalic and atraumatic.  Eyes:     Conjunctiva/sclera: Conjunctivae normal.  Cardiovascular:     Rate and Rhythm: Normal rate and regular rhythm.     Heart sounds: Normal heart sounds.  Pulmonary:     Effort: Pulmonary effort is normal. No respiratory distress.     Breath sounds: Normal breath sounds.  Chest:     Breasts:        Right: No mass, nipple  discharge, skin change or tenderness.        Left: No mass, nipple discharge, skin change or tenderness.     Comments: CBE complete Abdominal:     General: Bowel sounds are normal.     Palpations: Abdomen is soft.  Musculoskeletal:        General: Normal range of motion.     Cervical back: Normal range of motion.  Neurological:     Mental Status: She is alert and oriented to person, place, and time.  Skin:    General: Skin is warm and dry.  Psychiatric:        Mood and Affect: Mood normal.        Thought Content: Thought content normal.        Judgment: Judgment normal.  Vitals and nursing note reviewed.     Assessment:   29 y.o. year old G45P2002 Patient Active Problem List   Diagnosis Date Noted  . Supervision of other normal pregnancy, antepartum 12/11/2019  . Normal labor 07/23/2018  . History of gestational hypertension 01/30/2018  . Short interval between pregnancies affecting pregnancy in first trimester, antepartum 01/02/2018  . Encounter for supervision of normal pregnancy 01/02/2018     Plan:  1. Supervision of other normal pregnancy, antepartum -Congratulations given and patient welcomed to practice. -Discussed usage of Babyscripts and virtual visits as additional source of managing and completing PN visits in midst of coronavirus.   *Instructed to take blood pressure and record weekly into babyscripts. *Reviewed modified prenatal visit schedule and platforms used for virtual visits.  -Anticipatory guidance for prenatal visits including labs, ultrasounds, and testing; Initial labs drawn. -Genetic Screening discussed, First trimester screen, Quad screen and NIPS: ordered. -Encouraged to complete MyChart Registration for her ability to review results, send requests, and have questions addressed.  -Discussed estimated due date of July 05, 2020. -Ultrasound discussed; fetal anatomic survey: ordered. -Initiate prenatal vitamins; Rx sent to pharmacy on file.    -Encouraged to seek out care at office or emergency room for urgent and/or emergent concerns. -Educated on the nature of [facility] with multiple MDs and other Advanced Practice Providers was explained to patient; also emphasized that residents, students are part of our team. Informed of her right to refuse care as she deems appropriate.  -No questions or concerns.   2. Routine cervical smear -Exam findings discussed. -Educated on ASCCP guidelines regarding pap smear evaluation and frequency. -  Informed of turnover time and provider/clinic policy on releasing results.  3. Short interval between pregnancies affecting pregnancy in first trimester, antepartum -Last delivery March 2020 -Extensive discussion regarding birth control methods including significant other's plan/desire for Vasectomy.  Informed that vasectomy of benefits of this procedure in comparison to BTL which is considered a surgery.  Encouraged to discuss with SO and plan accordingly as neither desires more children   4.  History of gestational hypertension -Informed that provider would review guidelines and plan for initiation of bASA as appropriate. -Patient reassured that provider will send mychart message regarding POC.   Problem list reviewed and updated. Routine obstetric precautions reviewed.  Orders Placed This Encounter  Procedures  . Culture, OB Urine  . [place] MFM OB COMP + 14 WK    Standing Status:   Future    Standing Expiration Date:   02/10/2020    Order Specific Question:   Reason for Exam (SYMPTOM  OR DIAGNOSIS REQUIRED)    Answer:   Anatomy    Order Specific Question:   Preferred Location    Answer:   [facility]  . CBC/D/Plt+RPR+Rh+ABO+Rub Ab...  . Genetic Screening    Panorama  . Hemoglobin A1c    Return in about 4 weeks (around 01/08/2020) for Virtual LR-ROB.   Cherre Robins, CNM 12/11/2019 10:51 AM

## 2019-12-12 ENCOUNTER — Telehealth: Payer: Self-pay | Admitting: Obstetrics & Gynecology

## 2019-12-12 LAB — CBC/D/PLT+RPR+RH+ABO+RUB AB...
Antibody Screen: NEGATIVE
Basophils Absolute: 0 10*3/uL (ref 0.0–0.2)
Basos: 0 %
EOS (ABSOLUTE): 0.4 10*3/uL (ref 0.0–0.4)
Eos: 6 %
HCV Ab: <0.1 s/co ratio (ref 0.0–0.9)
HIV Screen 4th Generation wRfx: NONREACTIVE
Hematocrit: 31 % — ABNORMAL LOW (ref 34.0–46.6)
Hemoglobin: 9.5 g/dL — ABNORMAL LOW (ref 11.1–15.9)
Hepatitis B Surface Ag: NEGATIVE
Immature Grans (Abs): 0 10*3/uL (ref 0.0–0.1)
Immature Granulocytes: 0 %
Lymphocytes Absolute: 1.7 10*3/uL (ref 0.7–3.1)
Lymphs: 25 %
MCH: 24.2 pg — ABNORMAL LOW (ref 26.6–33.0)
MCHC: 30.6 g/dL — ABNORMAL LOW (ref 31.5–35.7)
MCV: 79 fL (ref 79–97)
Monocytes Absolute: 0.5 10*3/uL (ref 0.1–0.9)
Monocytes: 8 %
Neutrophils Absolute: 4.1 10*3/uL (ref 1.4–7.0)
Neutrophils: 61 %
Platelets: 290 10*3/uL (ref 150–450)
RBC: 3.92 x10E6/uL (ref 3.77–5.28)
RDW: 15.8 % — ABNORMAL HIGH (ref 11.7–15.4)
RPR Ser Ql: NONREACTIVE
Rh Factor: POSITIVE
Rubella Antibodies, IGG: 1.81 index (ref >0.99)
WBC: 6.7 10*3/uL (ref 3.4–10.8)

## 2019-12-12 LAB — CYTOLOGY - PAP
Chlamydia: NEGATIVE
Comment: NEGATIVE
Comment: NORMAL
Diagnosis: NEGATIVE
Neisseria Gonorrhea: NEGATIVE

## 2019-12-12 LAB — HCV INTERPRETATION

## 2019-12-12 LAB — HEMOGLOBIN A1C
Est. average glucose Bld gHb Est-mCnc: 117 mg/dL
Hgb A1c MFr Bld: 5.7 % — ABNORMAL HIGH (ref 4.8–5.6)

## 2019-12-12 NOTE — Telephone Encounter (Signed)
[facility] called and said patient insurance will not cover the Gummy's but it will cover Vita Fol Ultra and they have that in stock

## 2019-12-15 ENCOUNTER — Encounter: Payer: Self-pay | Admitting: Nurse Practitioner

## 2019-12-15 DIAGNOSIS — O99019 Anemia complicating pregnancy, unspecified trimester: Secondary | ICD-10-CM | POA: Insufficient documentation

## 2019-12-15 LAB — URINE CULTURE, OB REFLEX

## 2019-12-15 LAB — CULTURE, OB URINE

## 2019-12-15 NOTE — Telephone Encounter (Signed)
Contacted [facility] and informed them that they can switch the PNV to what the pt's insurance will cover.  Pharmacy verbalized understanding.   Addison Naegeli, RN

## 2019-12-31 DIAGNOSIS — Z419 Encounter for procedure for purposes other than remedying health state, unspecified: Secondary | ICD-10-CM | POA: Diagnosis not present

## 2020-01-01 ENCOUNTER — Encounter: Payer: Self-pay | Admitting: General Practice

## 2020-01-06 DIAGNOSIS — Z3480 Encounter for supervision of other normal pregnancy, unspecified trimester: Secondary | ICD-10-CM | POA: Diagnosis not present

## 2020-01-09 ENCOUNTER — Telehealth (INDEPENDENT_AMBULATORY_CARE_PROVIDER_SITE_OTHER): Payer: Medicaid Other | Admitting: Nurse Practitioner

## 2020-01-09 ENCOUNTER — Other Ambulatory Visit: Payer: Self-pay

## 2020-01-09 ENCOUNTER — Encounter: Payer: Self-pay | Admitting: Nurse Practitioner

## 2020-01-09 VITALS — BP 110/67 | HR 70

## 2020-01-09 DIAGNOSIS — O99019 Anemia complicating pregnancy, unspecified trimester: Secondary | ICD-10-CM

## 2020-01-09 DIAGNOSIS — Z298 Encounter for other specified prophylactic measures: Secondary | ICD-10-CM

## 2020-01-09 DIAGNOSIS — O99012 Anemia complicating pregnancy, second trimester: Secondary | ICD-10-CM

## 2020-01-09 DIAGNOSIS — D649 Anemia, unspecified: Secondary | ICD-10-CM

## 2020-01-09 DIAGNOSIS — Z3A14 14 weeks gestation of pregnancy: Secondary | ICD-10-CM

## 2020-01-09 DIAGNOSIS — Z3480 Encounter for supervision of other normal pregnancy, unspecified trimester: Secondary | ICD-10-CM

## 2020-01-09 MED ORDER — COMFORT FIT MATERNITY SUPP LG MISC: 0 refills | Status: DC

## 2020-01-09 NOTE — Patient Instructions (Addendum)
Drink at least 8 8-oz glasses of water every day.  Consider getting Covid Vaccine.  There have not been any documented vaccine related injuries, deaths or birth defects to infant or mom after receiving the COVID-19 vaccine to date. Remember although you are not at increased risk of contracting COVID-19 during pregnancy, you are at increased risk of developing severe disease and complications if you contract COVID-19 while pregnant   Iron-Rich Diet  Iron is a mineral that helps your body to produce hemoglobin. Hemoglobin is a protein in red blood cells that carries oxygen to your body's tissues. Eating too little iron may cause you to feel weak and tired, and it can increase your risk of infection. Iron is naturally found in many foods, and many foods have iron added to them (iron-fortified foods). You may need to follow an iron-rich diet if you do not have enough iron in your body due to certain medical conditions. The amount of iron that you need each day depends on your age, your sex, and any medical conditions you have. Follow instructions from your health care provider or a diet and nutrition specialist (dietitian) about how much iron you should eat each day. What are tips for following this plan? Reading food labels  Check food labels to see how many milligrams (mg) of iron are in each serving. Cooking  Cook foods in pots and pans that are made from iron.  Take these steps to make it easier for your body to absorb iron from certain foods: ? Soak beans overnight before cooking. ? Soak whole grains overnight and drain them before using. ? Ferment flours before baking, such as by using yeast in bread dough. Meal planning  When you eat foods that contain iron, you should eat them with foods that are high in vitamin C. These include oranges, peppers, tomatoes, potatoes, and mango. Vitamin C helps your body to absorb iron. General information  Take iron supplements only as told by your  health care provider. An overdose of iron can be life-threatening. If you were prescribed iron supplements, take them with orange juice or a vitamin C supplement.  When you eat iron-fortified foods or take an iron supplement, you should also eat foods that naturally contain iron, such as meat, poultry, and fish. Eating naturally iron-rich foods helps your body to absorb the iron that is added to other foods or contained in a supplement.  Certain foods and drinks prevent your body from absorbing iron properly. Avoid eating these foods in the same meal as iron-rich foods or with iron supplements. These foods include: ? Coffee, black tea, and red wine. ? Milk, dairy products, and foods that are high in calcium. ? Beans and soybeans. ? Whole grains. What foods should I eat? Fruits Prunes. Raisins. Eat fruits high in vitamin C, such as oranges, grapefruits, and strawberries, alongside iron-rich foods. Vegetables Spinach (cooked). Green peas. Broccoli. Fermented vegetables. Eat vegetables high in vitamin C, such as leafy greens, potatoes, bell peppers, and tomatoes, alongside iron-rich foods. Grains Iron-fortified breakfast cereal. Iron-fortified whole-wheat bread. Enriched rice. Sprouted grains. Meats and other proteins Beef liver. Oysters. Beef. Shrimp. [place]. Chicken. Tuna. Sardines. Chickpeas. Nuts. Tofu. Pumpkin seeds. Beverages Tomato juice. Fresh orange juice. Prune juice. Hibiscus tea. Fortified instant breakfast shakes. Sweets and desserts Blackstrap molasses. Seasonings and condiments Tahini. Fermented soy sauce. Other foods Wheat germ. The items listed above may not be a complete list of recommended foods and beverages. Contact a dietitian for more information. What foods  should I avoid? Grains Whole grains. Bran cereal. Bran flour. Oats. Meats and other proteins Soybeans. Products made from soy protein. Black beans. Lentils. Mung beans. Split peas. Dairy Milk. Cream. Cheese.  Yogurt. Cottage cheese. Beverages Coffee. Black tea. Red wine. Sweets and desserts Cocoa. Chocolate. Ice cream. Other foods Basil. Oregano. Large amounts of parsley. The items listed above may not be a complete list of foods and beverages to avoid. Contact a dietitian for more information. Summary  Iron is a mineral that helps your body to produce hemoglobin. Hemoglobin is a protein in red blood cells that carries oxygen to your body's tissues.  Iron is naturally found in many foods, and many foods have iron added to them (iron-fortified foods).  When you eat foods that contain iron, you should eat them with foods that are high in vitamin C. Vitamin C helps your body to absorb iron.  Certain foods and drinks prevent your body from absorbing iron properly, such as whole grains and dairy products. You should avoid eating these foods in the same meal as iron-rich foods or with iron supplements. This information is not intended to replace advice given to you by your health care provider. Make sure you discuss any questions you have with your health care provider. Document Revised: 03/30/2017 Document Reviewed: 03/13/2017 Elsevier Patient Education  2020 [employer].   Safe Medications in Pregnancy   Acne: Benzoyl Peroxide Salicylic Acid  Backache/Headache: Tylenol: 2 regular strength every 4 hours OR              2 Extra strength every 6 hours  Colds/Coughs/Allergies: Benadryl (alcohol free) 25 mg every 6 hours as needed Breath right strips Claritin Cepacol throat lozenges Chloraseptic throat spray Cold-Eeze- up to three times per day Cough drops, alcohol free Flonase (by prescription only) Guaifenesin Mucinex Robitussin DM (plain only, alcohol free) Saline nasal spray/drops Sudafed (pseudoephedrine) & Actifed ** use only after [redacted] weeks gestation and if you do not have high blood pressure Tylenol Vicks Vaporub Zinc lozenges Zyrtec   Constipation: Colace Ducolax  suppositories Fleet enema Glycerin suppositories Metamucil Milk of magnesia Miralax Senokot Smooth move tea  Diarrhea: Kaopectate Imodium A-D  *NO pepto Bismol  Hemorrhoids: Anusol Anusol HC Preparation H Tucks  Indigestion: Tums Maalox Mylanta Zantac  Pepcid  Insomnia: Benadryl (alcohol free) 25mg  every 6 hours as needed Tylenol PM Unisom, no Gelcaps  Leg Cramps: Tums MagGel  Nausea/Vomiting:  Bonine Dramamine Emetrol Ginger extract Sea bands Meclizine  Nausea medication to take during pregnancy:  Unisom (doxylamine succinate 25 mg tablets) Take one tablet daily at bedtime. If symptoms are not adequately controlled, the dose can be increased to a maximum recommended dose of two tablets daily (1/2 tablet in the morning, 1/2 tablet mid-afternoon and one at bedtime). Vitamin B6 100mg  tablets. Take one tablet twice a day (up to 200 mg per day).  Skin Rashes: Aveeno products Benadryl cream or 25mg  every 6 hours as needed Calamine Lotion 1% cortisone cream  Yeast infection: Gyne-lotrimin 7 Monistat 7   **If taking multiple medications, please check labels to avoid duplicating the same active ingredients **take medication as directed on the label ** Do not exceed 4000 mg of tylenol in 24 hours **Do not take medications that contain aspirin or ibuprofen

## 2020-01-09 NOTE — Progress Notes (Signed)
I connected with  Andrea Foster on 01/09/20 at 10:55 AM EDT by telephone and verified that I am speaking with the correct person using two identifiers.   I discussed the limitations, risks, security and privacy concerns of performing an evaluation and management service by telephone and the availability of in person appointments. I also discussed with the patient that there may be a patient responsible charge related to this service. The patient expressed understanding and agreed to proceed.  Radene Knee, RN 01/09/2020  11:10 AM

## 2020-01-09 NOTE — Progress Notes (Signed)
OBSTETRICS PRENATAL VIRTUAL VISIT ENCOUNTER NOTE  Provider location: Center for [facility]   I connected with [name] on 01/09/20 at 10:55 AM EDT by MyChart Video Encounter at home and verified that I am speaking with the correct person using two identifiers.   I discussed the limitations, risks, security and privacy concerns of performing an evaluation and management service virtually and the availability of in person appointments. I also discussed with the patient that there may be a patient responsible charge related to this service. The patient expressed understanding and agreed to proceed. Subjective:  Andrea Foster is a 29 y.o. G3P2002 at [redacted]w[redacted]d being seen today for ongoing prenatal care.  She is currently monitored for the following issues for this low-risk pregnancy and has Short interval between pregnancies affecting pregnancy in first trimester, antepartum; Encounter for supervision of normal pregnancy; History of gestational hypertension; Normal labor; Supervision of other normal pregnancy, antepartum; and Anemia in pregnancy on their problem list.  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  Contractions: Not present. Vag. Bleeding: None.  Movement: Absent. Denies any leaking of fluid.   The following portions of the patient's history were reviewed and updated as appropriate: allergies, current medications, past family history, past medical history, past social history, past surgical history and problem list.   Objective:   Vitals:   01/09/20 1116  BP: 110/67  Pulse: 70    Fetal Status:     Movement: Absent     General:  Alert, oriented and cooperative. Patient is in no acute distress.  Respiratory: Normal respiratory effort, no problems with respiration noted  Mental Status: Normal mood and affect. Normal behavior. Normal judgment and thought content.  Rest of physical exam  deferred due to type of encounter  Imaging: No results found.  Assessment and Plan:  Pregnancy: G3P2002 at [redacted]w[redacted]d  1. Supervision of other normal pregnancy, antepartum COVID-19 Vaccine Counseling: The patient was counseled on the potential benefits and lack of known risks of COVID vaccination, during pregnancy and breastfeeding, during today's visit. The patient's questions and concerns were addressed today, including safety of the vaccination and potential side effects as they have been published by ACOG and SMFM. The patient has been informed that there have not been any documented vaccine related injuries, deaths or birth defects to infant or mom after receiving the COVID-19 vaccine to date. The patient has been made aware that although she is not at increased risk of contracting COVID-19 during pregnancy, she is at increased risk of developing severe disease and complications if she contracts COVID-19 while pregnant. All patient questions were addressed during our visit today. The patient is still unsure of her decision for vaccination.   2. Antepartum anemia Has begun taking PNV with iron and was not doing that earlier in her pregnancy.  Hx of iron supplementation with last pregnancy. Given iron rich foods handout Will check CBC again and begin Iron supplementation if needed.  3.  Near syncope Reviewed dating every 3 hours and drinking 64 ounces of fluids daily to avoid syncope Also reviewed eating protein every time she eats.  Preterm labor symptoms and general obstetric precautions including but not limited to vaginal bleeding, contractions, leaking of fluid and fetal movement were reviewed in detail with the patient. I discussed the assessment and treatment plan with the patient. The patient was provided an opportunity to ask questions and all were answered.  The patient agreed with the plan and demonstrated an understanding of the instructions. The patient was advised to call back or  seek an in-person office evaluation/go to MAU at [facility] for any urgent or concerning symptoms. Please refer to After Visit Summary for other counseling recommendations.   I provided of face-to-face time during this encounter.  Return in about 4 weeks (around 02/06/2020) for in person ROB.  Future Appointments  Date Time Provider Department Center  02/09/2020  7:45 AM [facility] US4 [facility]    Currie Paris, NP Center for [employer], [facility]

## 2020-01-13 ENCOUNTER — Encounter: Payer: Self-pay | Admitting: General Practice

## 2020-01-30 DIAGNOSIS — Z419 Encounter for procedure for purposes other than remedying health state, unspecified: Secondary | ICD-10-CM | POA: Diagnosis not present

## 2020-02-09 ENCOUNTER — Other Ambulatory Visit: Payer: Self-pay | Admitting: *Deleted

## 2020-02-09 ENCOUNTER — Other Ambulatory Visit: Payer: Self-pay

## 2020-02-09 ENCOUNTER — Ambulatory Visit: Payer: Medicaid Other

## 2020-02-09 DIAGNOSIS — Z3480 Encounter for supervision of other normal pregnancy, unspecified trimester: Secondary | ICD-10-CM | POA: Diagnosis not present

## 2020-02-09 DIAGNOSIS — Z362 Encounter for other antenatal screening follow-up: Secondary | ICD-10-CM

## 2020-02-12 ENCOUNTER — Ambulatory Visit (INDEPENDENT_AMBULATORY_CARE_PROVIDER_SITE_OTHER): Payer: Medicaid Other | Admitting: Student

## 2020-02-12 ENCOUNTER — Other Ambulatory Visit: Payer: Self-pay

## 2020-02-12 VITALS — BP 103/67 | HR 79 | Wt 168.1 lb

## 2020-02-12 DIAGNOSIS — O99012 Anemia complicating pregnancy, second trimester: Secondary | ICD-10-CM | POA: Diagnosis not present

## 2020-02-12 DIAGNOSIS — Z3480 Encounter for supervision of other normal pregnancy, unspecified trimester: Secondary | ICD-10-CM

## 2020-02-12 DIAGNOSIS — Z3A20 20 weeks gestation of pregnancy: Secondary | ICD-10-CM

## 2020-02-12 MED ORDER — IRON (FERROUS SULFATE) 325 (65 FE) MG PO TABS: 1.0000 | ORAL_TABLET | Freq: Every day | ORAL | 3 refills | Status: DC

## 2020-02-12 MED ORDER — PRENATAL 27-1 MG PO TABS: 1.0000 | ORAL_TABLET | Freq: Every day | ORAL | 5 refills | Status: DC

## 2020-02-12 NOTE — Progress Notes (Signed)
   PRENATAL VISIT NOTE  Subjective:  Andrea Foster is a 29 y.o. G3P2002 at [redacted]w[redacted]d being seen today for ongoing prenatal care.  She is currently monitored for the following issues for this low-risk pregnancy and has Short interval between pregnancies affecting pregnancy in first trimester, antepartum; Encounter for supervision of normal pregnancy; History of gestational hypertension; Normal labor; Supervision of other normal pregnancy, antepartum; and Anemia in pregnancy on their problem list.  Patient reports that her prenatal vitamins are making her jittery.  Contractions: Not present. Vag. Bleeding: None.  Movement: Present. Denies leaking of fluid.   The following portions of the patient's history were reviewed and updated as appropriate: allergies, current medications, past family history, past medical history, past social history, past surgical history and problem list.   Objective:   Vitals:   02/12/20 0836  BP: 103/67  Pulse: 79  Weight: 168 lb 1.6 oz (76.2 kg)    Fetal Status: Fetal Heart Rate (bpm): 150   Movement: Present     General:  Alert, oriented and cooperative. Patient is in no acute distress.  Skin: Skin is warm and dry. No rash noted.   Cardiovascular: Normal heart rate noted  Respiratory: Normal respiratory effort, no problems with respiration noted  Abdomen: Soft, gravid, appropriate for gestational age.  Pain/Pressure: Absent     Pelvic: Cervical exam deferred        Extremities: Normal range of motion.  Edema: None  Mental Status: Normal mood and affect. Normal behavior. Normal judgment and thought content.   Assessment and Plan:  Pregnancy: G3P2002 at [redacted]w[redacted]d 1. Anemia during pregnancy in second trimester -Iron studies ordered -New prenatal vitamins ordered -Discussed EIC focus and low concerns for genetic defects based on reassuring [place] and NIPS -DIscussed birth control, partner will schedule vasectomy appointment during pregnancy.  - Iron Binding Cap  (TIBC)([facility]/Sun[facility]) - Ferritin  Preterm labor symptoms and general obstetric precautions including but not limited to vaginal bleeding, contractions, leaking of fluid and fetal movement were reviewed in detail with the patient. Please refer to After Visit Summary for other counseling recommendations.   Return in about 4 weeks (around 03/11/2020), or LROB on mychart with KK.  Future Appointments  Date Time Provider Department Center  03/08/2020  8:30 AM [facility] NURSE [facility]  03/08/2020  8:45 AM [facility] US4 [facility]    Marylene Land, CNM

## 2020-02-13 LAB — IRON AND TIBC
Iron Saturation: 5 % — CL (ref 15–55)
Iron: 26 ug/dL — ABNORMAL LOW (ref 27–159)
Total Iron Binding Capacity: 538 ug/dL — ABNORMAL HIGH (ref 250–450)
UIBC: 512 ug/dL — ABNORMAL HIGH (ref 131–425)

## 2020-02-13 LAB — FERRITIN: Ferritin: 6 ng/mL — ABNORMAL LOW (ref 15–150)

## 2020-02-15 DIAGNOSIS — O283 Abnormal ultrasonic finding on antenatal screening of mother: Secondary | ICD-10-CM | POA: Insufficient documentation

## 2020-03-01 DIAGNOSIS — Z419 Encounter for procedure for purposes other than remedying health state, unspecified: Secondary | ICD-10-CM | POA: Diagnosis not present

## 2020-03-08 ENCOUNTER — Other Ambulatory Visit: Payer: Self-pay

## 2020-03-08 ENCOUNTER — Encounter: Payer: Self-pay | Admitting: *Deleted

## 2020-03-08 ENCOUNTER — Ambulatory Visit: Payer: Medicaid Other | Admitting: *Deleted

## 2020-03-08 ENCOUNTER — Other Ambulatory Visit: Payer: Self-pay | Admitting: *Deleted

## 2020-03-08 ENCOUNTER — Ambulatory Visit: Payer: Medicaid Other | Attending: Obstetrics

## 2020-03-08 DIAGNOSIS — O09292 Supervision of pregnancy with other poor reproductive or obstetric history, second trimester: Secondary | ICD-10-CM | POA: Diagnosis not present

## 2020-03-08 DIAGNOSIS — O402XX0 Polyhydramnios, second trimester, not applicable or unspecified: Secondary | ICD-10-CM

## 2020-03-08 DIAGNOSIS — Z3480 Encounter for supervision of other normal pregnancy, unspecified trimester: Secondary | ICD-10-CM

## 2020-03-08 DIAGNOSIS — Z3A23 23 weeks gestation of pregnancy: Secondary | ICD-10-CM | POA: Diagnosis not present

## 2020-03-08 DIAGNOSIS — O99012 Anemia complicating pregnancy, second trimester: Secondary | ICD-10-CM

## 2020-03-08 DIAGNOSIS — D649 Anemia, unspecified: Secondary | ICD-10-CM

## 2020-03-08 DIAGNOSIS — Z362 Encounter for other antenatal screening follow-up: Secondary | ICD-10-CM | POA: Diagnosis not present

## 2020-03-13 ENCOUNTER — Inpatient Hospital Stay (HOSPITAL_COMMUNITY)
Admission: AD | Admit: 2020-03-13 | Discharge: 2020-03-13 | Disposition: A | Discharge status: Home / Self Care | Payer: Medicaid Other | Attending: Obstetrics and Gynecology | Admitting: Obstetrics and Gynecology

## 2020-03-13 ENCOUNTER — Encounter (HOSPITAL_COMMUNITY): Payer: Self-pay | Admitting: Obstetrics and Gynecology

## 2020-03-13 ENCOUNTER — Other Ambulatory Visit: Payer: Self-pay

## 2020-03-13 DIAGNOSIS — Z3689 Encounter for other specified antenatal screening: Secondary | ICD-10-CM | POA: Insufficient documentation

## 2020-03-13 DIAGNOSIS — O2692 Pregnancy related conditions, unspecified, second trimester: Secondary | ICD-10-CM | POA: Diagnosis not present

## 2020-03-13 DIAGNOSIS — W19XXXA Unspecified fall, initial encounter: Secondary | ICD-10-CM

## 2020-03-13 DIAGNOSIS — Z3A23 23 weeks gestation of pregnancy: Secondary | ICD-10-CM | POA: Diagnosis not present

## 2020-03-13 DIAGNOSIS — O9A212 Injury, poisoning and certain other consequences of external causes complicating pregnancy, second trimester: Secondary | ICD-10-CM | POA: Diagnosis present

## 2020-03-13 HISTORY — DX: Anemia, unspecified: D64.9

## 2020-03-13 HISTORY — DX: Depression, unspecified: F32.A
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
## 2020-03-13 NOTE — MAU Note (Signed)
Baby very active-difficult to maintain continuous tracing

## 2020-03-13 NOTE — MAU Note (Signed)
FHR tracing remains appropriate for gestational age. Uterus remains soft and non tender. Pt continues to deny pain, contractions or cramping.

## 2020-03-13 NOTE — Discharge Instructions (Signed)
Polyhydramnios When a woman becomes pregnant, a sac forms around her growing baby. This sac, called the amniotic sac, is filled with fluid (amniotic fluid) that:  Cushions and protects the baby.  Helps the baby's lungs and gastrointestinal tract grow.  Allows the baby to move around, which helps the baby's muscles and bones develop. Polyhydramnios means that there is too much fluid in the sac. Babies born with polyhydramnios should be checked for birth defects. What are the causes? This condition may be caused by:  Diabetes in the mother.  The baby being larger than normal for the baby's age (large for gestational age).  Problems that prevent the fetus from swallowing amniotic fluid. These may include: ? An abnormal chromosome. ? Abnormality in the baby's intestinal tract. ? A birth defect in which the baby does not have a brain or parts of the brain (anencephaly).  A condition that affects twins, called twin-twin transfusion syndrome.  An illness in the mother, such as kidney or heart disease.  A tumor of the placenta (chorioangioma).  An infection in the baby. What are the signs or symptoms? Symptoms of this condition include:  A uterus that is larger than it should be for the stage of pregnancy.  Shortness of breath.  Increased feeling of pressure on the abdomen.  Increased swelling in the legs.  Preterm labor.  Preeclampsia. How is this diagnosed? This condition may be diagnosed based on:  A measurement of your abdomen. Your health care provider can diagnose the condition if he or she measures you and notices that your uterus is larger than it should be.  An ultrasound. This image may be taken by placing a device on your abdomen or into your vagina. The test can measure the amount of fluid in the amniotic sac (amniotic fluid index). It can also: ? Show if you are carrying twins or more. ? Measure the growth of the baby. ? Look for birth defects. How is this  treated? This condition is treated by removing fluid from the amniotic sac. This is done through a procedure where a needle is inserted through the skin into the uterus (amniocentesis). Follow these instructions at home:  Make sure to keep any medical conditions you have under control. If you have diabetes, talk to your health care provider about ways to manage your blood sugar.  Keep all your prenatal visits as told by your health care provider. It is important to follow your health care provider's recommendations. Contact a health care provider if:  You think your uterus has grown too fast in a short period of time.  You feel a great amount of pressure in your pelvis and are more uncomfortable than expected. Get help right away if:  You have a gush of fluid or are leaking fluid from your vagina.  You stop feeling the baby move.  You do not feel the baby kicking as much as usual.  You have diabetes and you have a hard time keeping it under control.  You have kidney or heart disease and it is causing you problems. Summary  Polyhydramnios means there is too much fluid in the amniotic sac. This can lead to birth defects.  This condition may be diagnosed based on a measurement of your abdomen or an ultrasound.  Keep all your prenatal visits as told by your health care provider. It is important to follow your health care provider's recommendations. This information is not intended to replace advice given to you by your health   care provider. Make sure you discuss any questions you have with your health care provider. Document Revised: 03/30/2017 Document Reviewed: 07/04/2016 Elsevier Patient Education  2020 [employer].

## 2020-03-17 ENCOUNTER — Telehealth (INDEPENDENT_AMBULATORY_CARE_PROVIDER_SITE_OTHER): Payer: Medicaid Other | Admitting: Student

## 2020-03-17 DIAGNOSIS — Z3A24 24 weeks gestation of pregnancy: Secondary | ICD-10-CM

## 2020-03-17 DIAGNOSIS — Z3482 Encounter for supervision of other normal pregnancy, second trimester: Secondary | ICD-10-CM | POA: Diagnosis not present

## 2020-03-17 DIAGNOSIS — Z3480 Encounter for supervision of other normal pregnancy, unspecified trimester: Secondary | ICD-10-CM

## 2020-03-17 DIAGNOSIS — F419 Anxiety disorder, unspecified: Secondary | ICD-10-CM

## 2020-03-17 NOTE — Progress Notes (Signed)
I connected with  [name] on 03/17/20 at  9:15 AM EST by telephone and verified that I am speaking with the correct person using two identifiers.   I discussed the limitations, risks, security and privacy concerns of performing an evaluation and management service by telephone and the availability of in person appointments. I also discussed with the patient that there may be a patient responsible charge related to this service. The patient expressed understanding and agreed to proceed.  Marylynn Pearson, RN 03/17/2020  9:05 AM
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
## 2020-03-18 ENCOUNTER — Other Ambulatory Visit: Payer: Self-pay

## 2020-03-18 DIAGNOSIS — Z3480 Encounter for supervision of other normal pregnancy, unspecified trimester: Secondary | ICD-10-CM

## 2020-03-19 ENCOUNTER — Other Ambulatory Visit: Payer: Medicaid Other

## 2020-03-19 ENCOUNTER — Other Ambulatory Visit: Payer: Self-pay

## 2020-03-19 DIAGNOSIS — O99012 Anemia complicating pregnancy, second trimester: Secondary | ICD-10-CM

## 2020-03-19 DIAGNOSIS — Z3480 Encounter for supervision of other normal pregnancy, unspecified trimester: Secondary | ICD-10-CM | POA: Diagnosis not present

## 2020-03-20 LAB — GLUCOSE TOLERANCE, 2 HOURS W/ 1HR
Glucose, 1 hour: 96 mg/dL (ref 65–179)
Glucose, 2 hour: 64 mg/dL — ABNORMAL LOW (ref 65–152)
Glucose, Fasting: 85 mg/dL (ref 65–91)

## 2020-03-20 LAB — CBC
Hematocrit: 32.9 % — ABNORMAL LOW (ref 34.0–46.6)
Hemoglobin: 10.9 g/dL — ABNORMAL LOW (ref 11.1–15.9)
MCH: 27.9 pg (ref 26.6–33.0)
MCHC: 33.1 g/dL (ref 31.5–35.7)
MCV: 84 fL (ref 79–97)
Platelets: 267 10*3/uL (ref 150–450)
RBC: 3.9 x10E6/uL (ref 3.77–5.28)
RDW: 19 % — ABNORMAL HIGH (ref 11.7–15.4)
WBC: 6 10*3/uL (ref 3.4–10.8)

## 2020-03-20 LAB — B12 AND FOLATE PANEL
Folate: 13.6 ng/mL (ref >3.0)
Vitamin B-12: 397 pg/mL (ref 232–1245)

## 2020-03-20 LAB — FERRITIN, SERUM (SERIAL): Ferritin: 21 ng/mL (ref 15–150)
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
## 2020-03-31 ENCOUNTER — Ambulatory Visit (INDEPENDENT_AMBULATORY_CARE_PROVIDER_SITE_OTHER): Payer: Medicaid Other | Admitting: Clinical

## 2020-03-31 DIAGNOSIS — F419 Anxiety disorder, unspecified: Secondary | ICD-10-CM | POA: Diagnosis not present

## 2020-03-31 DIAGNOSIS — Z3482 Encounter for supervision of other normal pregnancy, second trimester: Secondary | ICD-10-CM | POA: Diagnosis not present

## 2020-03-31 DIAGNOSIS — O99342 Other mental disorders complicating pregnancy, second trimester: Secondary | ICD-10-CM | POA: Diagnosis not present

## 2020-03-31 DIAGNOSIS — F4322 Adjustment disorder with anxiety: Secondary | ICD-10-CM | POA: Diagnosis not present

## 2020-03-31 DIAGNOSIS — Z419 Encounter for procedure for purposes other than remedying health state, unspecified: Secondary | ICD-10-CM | POA: Diagnosis not present

## 2020-03-31 NOTE — Patient Instructions (Addendum)
Center for [facility] Healthcare at [facility] [address] [place], [place] [address] [phone] (main office) [phone] [facility] office)  [email or web address] (waterbirth class, etc)  [place] [facility]  (Childcare options, Early childcare development, etc.) [email or web address]

## 2020-04-05 NOTE — BH Specialist Note (Signed)
Integrated Behavioral Health via Telemedicine Visit  04/05/2020 Andrea Foster [id number]  Number of Integrated Behavioral Health visits: 2 Session Start time: 8:30  Session End time: 8:43 Total time: 13  Referring Provider: Luna Kitchens Andrea/Family location: [facility] Provider location: Center for [facility] at [facility] for Women  All persons participating in visit: Andrea Foster and [facility] Rilda Bulls   Types of Service: Individual psychotherapy  I connected with [name] and/or Alger Simons n/a by Telephone and verified that I am speaking with the correct person using two identifiers.    Discussed confidentiality: at previous visit  I discussed the limitations of telemedicine and the availability of in person appointments.  Discussed there is a possibility of technology failure and discussed alternative modes of communication if that failure occurs.  I discussed that engaging in this telemedicine visit, they consent to the provision of behavioral healthcare and the services will be billed under their insurance.  Andrea and/or legal guardian expressed understanding and consented to Telemedicine visit: Yes   Presenting Concerns: Andrea and/or family reports the following symptoms/concerns: Pt states her anxiety and stress have decreased the past two weeks, attributed to a positive change in work responsibilities, using self-coping strategies, car back, daycare challenge resolved, and registered for waterbirth classes; mild life stress, but pt has no other questions or concerns at this time.  Duration of problem: Current pregnancy; Severity of problem: mild  Andrea and/or Family's Strengths/Protective Factors: Social connections, Social and Emotional competence, Concrete supports in place (healthy food, safe environments, etc.), Sense of purpose and Physical Health (exercise, healthy diet, medication compliance, etc.)  Goals  Addressed: Andrea will: 1.  Maintain reduction of symptoms of: anxiety   Progress towards Goals: Achieved  Interventions: Interventions utilized:  Supportive Counseling Standardized Assessments completed: GAD-7 and PHQ 9  Andrea and/or Family Response: Pt is managing anxiety and life stress well at this time  Assessment: Andrea currently experiencing Psychosocial stress.   Andrea may benefit from supportive counsel today.  Plan: 1. Follow up with behavioral health clinician on : Call Tyffani Foglesong at [phone] if symptoms increase 2. Behavioral recommendations:  -Continue taking prenatal vitamin daily -Continue using self-coping strategies to manage emotions -Continue with plan to discuss waterbirth with medical provider on 04/15/20; attend waterbirth class -Consider reading Postpartum Planner (on After Visit Summary) 3. Referral(s): Integrated [employer] (In Clinic)  I discussed the assessment and treatment plan with the Andrea and/or parent/guardian. They were provided an opportunity to ask questions and all were answered. They agreed with the plan and demonstrated an understanding of the instructions.   They were advised to call back or seek an in-person evaluation if the symptoms worsen or if the condition fails to improve as anticipated.  Valetta Close Ryland Smoots, LCSW

## 2020-04-08 ENCOUNTER — Ambulatory Visit: Payer: Medicaid Other | Attending: Obstetrics and Gynecology

## 2020-04-08 ENCOUNTER — Ambulatory Visit: Payer: Medicaid Other | Admitting: *Deleted

## 2020-04-08 ENCOUNTER — Other Ambulatory Visit: Payer: Self-pay | Admitting: *Deleted

## 2020-04-08 ENCOUNTER — Other Ambulatory Visit: Payer: Self-pay

## 2020-04-08 DIAGNOSIS — O09292 Supervision of pregnancy with other poor reproductive or obstetric history, second trimester: Secondary | ICD-10-CM

## 2020-04-08 DIAGNOSIS — Z362 Encounter for other antenatal screening follow-up: Secondary | ICD-10-CM | POA: Diagnosis not present

## 2020-04-08 DIAGNOSIS — O402XX0 Polyhydramnios, second trimester, not applicable or unspecified: Secondary | ICD-10-CM

## 2020-04-08 DIAGNOSIS — Z3480 Encounter for supervision of other normal pregnancy, unspecified trimester: Secondary | ICD-10-CM

## 2020-04-08 DIAGNOSIS — Z3A27 27 weeks gestation of pregnancy: Secondary | ICD-10-CM | POA: Insufficient documentation

## 2020-04-08 DIAGNOSIS — O99012 Anemia complicating pregnancy, second trimester: Secondary | ICD-10-CM

## 2020-04-08 DIAGNOSIS — D649 Anemia, unspecified: Secondary | ICD-10-CM

## 2020-04-15 ENCOUNTER — Ambulatory Visit (INDEPENDENT_AMBULATORY_CARE_PROVIDER_SITE_OTHER): Payer: Medicaid Other | Admitting: Clinical

## 2020-04-15 ENCOUNTER — Other Ambulatory Visit: Payer: Self-pay

## 2020-04-15 DIAGNOSIS — Z658 Other specified problems related to psychosocial circumstances: Secondary | ICD-10-CM | POA: Diagnosis not present

## 2020-04-15 NOTE — Patient Instructions (Signed)
Center for [facility] at [facility] for Women [place], [place] [address] [phone] (main office) [phone] ([place] office)     BRAINSTORMING  Develop a Plan Goals: . Provide a way to start conversation about your new life with a baby . Assist parents in recognizing and using resources within their reach . Help pave the way before birth for an easier period of transition afterwards.  Make a list of the following information to keep in a central location: . Full name of Mom and Partner: _____________________________________________ . 82 full name and Date of Birth: ___________________________________________ . Home Address: ___________________________________________________________ ________________________________________________________________________ . Home Phone: ____________________________________________________________ . Parents' cell numbers: _____________________________________________________ ________________________________________________________________________ . Name and contact info for OB: ______________________________________________ . Name and contact info for Pediatrician:________________________________________ . Contact info for Lactation Consultants: ________________________________________  REST and SLEEP *You each need at least 4-5 hours of uninterrupted sleep every day. Write specific names and contact information.* . How are you going to rest in the postpartum period? While partner's home? When partner returns to work? When you both return to work? Marland Kitchen Where will your baby sleep? Marland Kitchen Who is available to help during the day? Evening? Night? . Who could move in for a period to help support you? Marland Kitchen What are some ideas to help you get enough  sleep? __________________________________________________________________________________________________________________________________________________________________________________________________________________________________________ NUTRITIOUS FOOD AND DRINK *Plan for meals before your baby is born so you can have healthy food to eat during the immediate postpartum period.* . Who will look after breakfast? Lunch? Dinner? List names and contact information. Brainstorm quick, healthy ideas for each meal. . What can you do before baby is born to prepare meals for the postpartum period? . How can others help you with meals? Marland Kitchen Which grocery stores provide online shopping and delivery? Marland Kitchen Which restaurants offer take-out or delivery options? ______________________________________________________________________________________________________________________________________________________________________________________________________________________________________________________________________________________________________________________________________________________________________________________________________  CARE FOR MOM *It's important that mom is cared for and pampered in the postpartum period. Remember, the most important ways new mothers need care are: sleep, nutrition, gentle exercise, and time off.* . Who can come take care of mom during this period? Make a list of people with their contact information. . List some activities that make you feel cared for, rested, and energized? Who can make sure you have opportunities to do these things? . Does mom have a space of her very own within your home that's just for her? Make a "[facility]" where she can be comfortable, rest, and renew herself  daily. ______________________________________________________________________________________________________________________________________________________________________________________________________________________________________________________________________________________________________________________________________________________________________________________________________    CARE FOR AND FEEDING BABY *Knowledgeable and encouraging people will offer the best support with regard to feeding your baby.* . Educate yourself and choose the best feeding option for your baby. . Make a list of people who will guide, support, and be a resource for you as your care for and feed your baby. (Friends that have breastfed or are currently breastfeeding, lactation consultants, breastfeeding support groups, etc.) . Consider a postpartum doula. (These websites can give you information: dona.org & BuyingShow.es) . Seek out local breastfeeding resources like the breastfeeding support group at [employer] or [employer]. ______________________________________________________________________________________________________________________________________________________________________________________________________________________________________________________________________________________________________________________________________________________________________________________________________  Verner Chol AND ERRANDS . Who can help with a thorough cleaning before baby is born? . Make a list of people who will help with housekeeping and chores, like laundry, light cleaning, dishes, bathrooms, etc. . Who can run some errands for you? Marland Kitchen What can you do to make sure you are stocked with basic supplies before baby is born? . Who is going to do the  shopping? ______________________________________________________________________________________________________________________________________________________________________________________________________________________________________________________________________________________________________________________________________________________________________________________________________     Family Adjustment *Nurture yourselves.it helps parents be more loving and allows for better bonding with their child.* . What sorts of things do you and partner enjoy doing together? Which activities help you to connect and strengthen your relationship? Make a list of those things. Make a list of people whom you trust to care for your baby so you can have some time together as a couple. . What types of things help partner feel connected to Mom? Make a list. . What needs will partner have in order to bond with baby? . Other children? Who will care for them when you go into labor and while you are in the hospital? . Think about what the needs of your older children might be. Who can help you meet those needs? In what ways are you helping them prepare for bringing baby home? List some specific strategies you have for family adjustment. _______________________________________________________________________________________________________________________________________________________________________________________________________________________________________________________________________________________________________________________________________________  SUPPORT *Someone who can empathize with experiences normalizes your problems and makes them more bearable.* . Make a list of other friends, neighbors, and/or co-workers you know with infants (and small children, if applicable) with whom you can connect. . Make a list of local or online support groups, mom groups, etc. in which you can be  involved. ______________________________________________________________________________________________________________________________________________________________________________________________________________________________________________________________________________________________________________________________________________________________________________________________________  Childcare Plans . Investigate and plan for childcare if mom is returning to work. . Talk about mom's concerns about her transition back to work. . Talk about partner's concerns regarding this transition.  Mental Health *Your mental health is one of the highest priorities for a pregnant or postpartum mom.* . 1 in 5 women experience anxiety and/or depression from the time of conception through the first year after birth. . Postpartum Mood Disorders are the #1 complication of pregnancy and childbirth and the suffering experienced by these mothers is not necessary! These illnesses are temporary and respond well to treatment, which often includes self-care, social support, talk therapy, and medication when needed. . Women experiencing anxiety and depression often say things like: "I'm supposed to be happy.why do I feel so sad?", "Why can't I snap out of it?", "I'm having thoughts that scare me." . There is no need to be embarrassed if you are feeling these symptoms: o Overwhelmed, anxious, angry, sad, guilty, irritable, hopeless, exhausted but can't sleep o You are NOT alone. You are NOT to blame. With help, you WILL be well. . Where can I find help? Medical professionals such as your OB, midwife, gynecologist, family practitioner, primary care provider, pediatrician, or mental health providers; [facility] support groups: Feelings After Birth, Breastfeeding Support Group, Baby and Me Group, and Fit 4 Two exercise classes. . You have permission to ask for help. It will confirm your feelings, validate your  experiences, share/learn coping strategies, and gain support and encouragement as you heal. You are important! BRAINSTORM . Make a list of local resources, including resources for mom and for partner. . Identify support groups. . Identify people to call late at night - include names and contact info. . Talk with partner about perinatal mood and anxiety disorders. . Talk with your OB, midwife, and doula about baby blues and about perinatal mood and anxiety disorders. . Talk with your pediatrician about perinatal mood and anxiety disorders.   Support & Sanity Savers   What do you really need?  . Basics . In preparing for a new baby, many expectant parents spend hours shopping for baby clothes, decorating the nursery, and deciding which car seat to   buy. Yet most don't think much about what the reality of parenting a newborn will be like, and what they need to make it through that. So, here is the advice of experienced parents. We know you'll read this, and think "they're exaggerating, I don't really need that." Just trust [place] on these, OK? Plan for all of this, and if it turns out you don't need it, come back and teach [place] how you did it!  Satira Anis (Once baby's survival needs are met, make sure you attend to your own survival needs!) . Sleep . An average newborn sleeps 16-18 hours per day, over 6-7 sleep periods, rarely more than three hours at a time. It is normal and healthy for a newborn to wake throughout the night... but really hard on parents!! . Naps. Prioritize sleep above any responsibilities like: cleaning house, visiting friends, running errands, etc.  Sleep whenever baby sleeps. If you can't nap, at least have restful times when baby eats. The more rest you get, the more patient you will be, the more emotionally stable, and better at solving problems.  . Food . You may not have realized it would be difficult to eat when you have a newborn. Yet, when we talk to . countless new  parents, they say things like "it may be 2:00 pm when I realize I haven't had breakfast yet." Or "every time we sit down to dinner, baby needs to eat, and my food gets cold, so I don't bother to eat it." . Finger food. Before your baby is born, stock up with one months' worth of food that: 1) you can eat with one hand while holding a baby, 2) doesn't need to be prepped, 3) is good hot or cold, 4) doesn't spoil when left out for a few hours, and 5) you like to eat. Think about: nuts, dried fruit, Clif bars, pretzels, jerky, gogurt, baby carrots, apples, bananas, crackers, cheez-n-crackers, string cheese, hot pockets or frozen burritos to microwave, garden burgers and breakfast pastries to put in the toaster, yogurt drinks, etc. . Restaurant Menus. Make lists of your favorite restaurants & menu items. When family/friends want to help, you can give specific information without much thought. They can either bring you the food or send gift cards for just the right meals. Rosaura Carpenter Meals.  Take some time to make a few meals to put in the freezer ahead of time.  Easy to freeze meals can be anything such as soup, lasagna, chicken pie, or spaghetti sauce. . Set up a Meal Schedule.  Ask friends and family to sign up to bring you meals during the first few weeks of being home. (It can be passed around at baby showers!) You have no idea how helpful this will be until you are in the throes of parenting.  https://hamilton-woodard.com/ is a great website to check out. . Emotional Support . Know who to call when you're stressed out. Parenting a newborn is very challenging work. There are times when it totally overwhelms your normal coping abilities. EVERY NEW PARENT NEEDS TO HAVE A PLAN FOR WHO TO CALL WHEN THEY JUST CAN'T COPE ANY MORE. (And it has to be someone other than the baby's other parent!) Before your baby is born, come up with at least one person you can call for support - write their phone number down and post it on the  refrigerator. Marland Kitchen Anxiety & Sadness. Baby blues are normal after pregnancy; however, there are more severe types of anxiety &  sadness which can occur and should not be ignored.  They are always treatable, but you have to take the first step by reaching out for help. [facility] offers a "Mom Talk" group which meets every Tuesday from 10 am - 11 am.  This group is for new moms who need support and connection after their babies are born.  Call [phone].  Marland Kitchen Really, Really Helpful (Plan for them! Make sure these happen often!!) . Physical Support with Taking Care of Yourselves . Asking friends and family. Before your baby is born, set up a schedule of people who can come and visit and help out (or ask a friend to schedule for you). Any time someone says "let me know what I can do to help," sign them up for a day. When they get there, their job is not to take care of the baby (that's your job and your joy). Their job is to take care of you!  . Postpartum doulas. If you don't have anyone you can call on for support, look into postpartum doulas:  professionals at helping parents with caring for baby, caring for themselves, getting breastfeeding started, and helping with household tasks. www.padanc.org is a helpful website for learning about doulas in our area. . Peer Support / Parent Groups . Why: One of the greatest ideas for new parents is to be around other new parents. Parent groups give you a chance to share and listen to others who are going through the same season of life, get a sense of what is normal infant development by watching several babies learn and grow, share your stories of triumph and struggles with empathetic ears, and forgive your own mistakes when you realize all parents are learning by trial and error. . Where to find: There are many places you can meet other new parents throughout our community.  [facility] offers the following classes for new moms and their little ones:  Baby  and Me (Birth to [place]) and Breastfeeding Support Group. Go to www.conehealthybaby.com or call [phone] for more information. . Time for your Relationship . It's easy to get so caught up in meeting baby's immediate needs that it's hard to find time to connect with your partner, and meet the needs of your relationship. It's also easy to forget what "quality time with your partner" actually looks like. If you take your baby on a date, you'd be amazed how much of your couple time is spent feeding the baby, diapering the baby, admiring the baby, and talking about the baby. . Dating: Try to take time for just the two of you. Babysitter tip: Sometimes when moms are breastfeeding a newborn, they find it hard to figure out how to schedule outings around baby's unpredictable feeding schedules. Have the babysitter come for a three hour period. When she comes over, if baby has just eaten, you can leave right away, and come back in two hours. If baby hasn't fed recently, you start the date at home. Once baby gets hungry and gets a good feeding in, you can head out for the rest of your date time. . Date Nights at Home: If you can't get out, at least set aside one evening a week to prioritize your relationship: whenever baby dozes off or doesn't have any immediate needs, spend a little time focusing on each other. . Potential conflicts: The main relationship conflicts that come up for new parents are: issues related to sexuality, financial stresses, a feeling of an unfair division  of household tasks, and conflicts in parenting styles. The more you can work on these issues before baby arrives, the better!  . Fun and Frills (Don't forget these. and don't feel guilty for indulging in them!) . Everyone has something in life that is a fun little treat that they do just for themselves. It may be: reading the morning paper, or going for a daily jog, or having coffee with a friend once a week, or going to a movie on Friday  nights, or fine chocolates, or bubble baths, or curling up with a good book. . Unless you do fun things for yourself every now and then, it's hard to have the energy for fun with your baby. Whatever your "special" treats are, make sure you find a way to continue to indulge in them after your baby is born. These special moments can recharge you, and allow you to return to baby with a new joy   PERINATAL MOOD DISORDERS: MATERNAL MENTAL HEALTH FROM CONCEPTION THROUGH THE POSTPARTUM PERIOD   Emergency and Crisis Resources:  If you are an imminent risk to self or others, are experiencing intense personal distress, and/or have noticed significant changes in activities of daily living, call:  . 911 . [facility]: [phone] . Mobile Crisis: 877-626-1772 . National Suicide Hotline: 1-800-273-8255 Or visit the following crisis centers: . Local Emergency Departments . [facility]: [address], [place] [phone]. Hours: 8:30AM-5PM. Insurance Accepted: Medicaid, Medicare, and Uninsured.  . [facility]  [address], [place] Mon-Friday 8am-3pm  [phone]                                                                                    Non-Crisis Resources: To identify specific providers that are covered by your insurance, contact your insurance company or local agencies: [place]: 1-800-256-2452 [facility]: 888-581-9988 [employer]: 1-800-939-5911 Postpartum Support International- [place] 1-800-944-4773                                                      Outpatient therapy and medication management providers:  [facility] [phone] Hours: 9AM-5PM  Insurance Accepted: AARP, Aetna, BCBS, Cigna, [employer], Humana, Medicare  [facility] [phone] Hours: 8AM-5PM  nsurance Accepted: All insurances EXCEPT AARP, Aetna, [employer], and  [employer] [facility]: [phone]             Hours: 8AM-8PM Insurance Accepted: Aetna, BCBS, Cigna, [employer], Medicaid, Medicare, Uninsured [facility]: [phone] [facility]: [phone] Hours: 8:30AM-7PM Insurance Accepted: Aetna, BCBS, Medicaid, Medicare, Tricare, United [facility]:  [phone]              Hours:9AM-5PM Insurance Accepted:  Aetna, BCBS, [facility], Medicaid, United Health Care  [facility] [phone] Hours: 9AM-5:30PM Insurance Accepted: AARP, Aetna, BCBS, Cigna, and Medicaid, Medicare, United Health Care [facility]:  [phone] Hours: 9am-5pm Insurance Accepted: BCBS; they do not accept Medicaid/Medicare   [facility]: [phone] Hours: 9am-9pm Insurance Accepted: All major insurance including Medicaid and Medicare [facility]: [phone] Hours: 9AM-5:30PM Insurance Accepted: All insurances EXCEPT Medicaid and Medicare. [facility]: [place]: [phone] [place]:  [place] (support for children in the NICU and/or with special needs), [place] [facility]: [phone]                                                                                     Online Resources: Postpartum Support International: http://jones-berg.com/  800-944-4PPD 2Moms Supporting Moms:  www.momssupportingmoms.net

## 2020-04-16 ENCOUNTER — Encounter: Payer: Self-pay | Admitting: Family Medicine

## 2020-04-16 ENCOUNTER — Other Ambulatory Visit: Payer: Self-pay

## 2020-04-16 ENCOUNTER — Ambulatory Visit (INDEPENDENT_AMBULATORY_CARE_PROVIDER_SITE_OTHER): Payer: Medicaid Other | Admitting: Family Medicine

## 2020-04-16 VITALS — BP 99/64 | HR 72 | Wt 179.1 lb

## 2020-04-16 DIAGNOSIS — Z23 Encounter for immunization: Secondary | ICD-10-CM

## 2020-04-16 DIAGNOSIS — Z8759 Personal history of other complications of pregnancy, childbirth and the puerperium: Secondary | ICD-10-CM

## 2020-04-16 DIAGNOSIS — Z3480 Encounter for supervision of other normal pregnancy, unspecified trimester: Secondary | ICD-10-CM | POA: Diagnosis not present

## 2020-04-16 DIAGNOSIS — O99013 Anemia complicating pregnancy, third trimester: Secondary | ICD-10-CM

## 2020-04-16 DIAGNOSIS — O09891 Supervision of other high risk pregnancies, first trimester: Secondary | ICD-10-CM

## 2020-04-16 NOTE — Progress Notes (Signed)
° °  Subjective:  Andrea Foster is a 29 y.o. G3P2002 at [redacted]w[redacted]d being seen today for ongoing prenatal care.  She is currently monitored for the following issues for this low-risk pregnancy and has Short interval between pregnancies affecting pregnancy in first trimester, antepartum; History of gestational hypertension; Normal labor; Supervision of other normal pregnancy, antepartum; Anemia in pregnancy; and Fetal echogenic intracardiac focus on prenatal ultrasound on their problem list.  Patient reports no complaints.  Contractions: Irritability. Vag. Bleeding: None.  Movement: Present. Denies leaking of fluid.   The following portions of the patient's history were reviewed and updated as appropriate: allergies, current medications, past family history, past medical history, past social history, past surgical history and problem list. Problem list updated.  Objective:   Vitals:   04/16/20 0927  BP: 99/64  Pulse: 72  Weight: 179 lb 1.6 oz (81.2 kg)    Fetal Status:     Movement: Present     General:  Alert, oriented and cooperative. Patient is in no acute distress.  Skin: Skin is warm and dry. No rash noted.   Cardiovascular: Normal heart rate noted  Respiratory: Normal respiratory effort, no problems with respiration noted  Abdomen: Soft, gravid, appropriate for gestational age. Pain/Pressure: Absent     Pelvic: Vag. Bleeding: None     Cervical exam deferred        Extremities: Normal range of motion.  Edema: None  Mental Status: Normal mood and affect. Normal behavior. Normal judgment and thought content.   Urinalysis:      Assessment and Plan:  Pregnancy: G3P2002 at [redacted]w[redacted]d  1. Supervision of other normal pregnancy, antepartum BP and FHR normal  TDaP today 28wk labs normal Last US showed poly, following with MFM, repeat US scheduled in 4 weeks COVID counseled, undecided Partner planning to get a vasectomy  2. Short interval between pregnancies affecting pregnancy in first  trimester, antepartum   3. History of gestational hypertension BP normal today  4. Anemia during pregnancy in third trimester Hgb 10.9 on 28wk labs  Preterm labor symptoms and general obstetric precautions including but not limited to vaginal bleeding, contractions, leaking of fluid and fetal movement were reviewed in detail with the patient. Please refer to After Visit Summary for other counseling recommendations.  Return in 2 weeks (on 04/30/2020) for [facility], ob visit.   Venora Maples, MD

## 2020-04-16 NOTE — Addendum Note (Signed)
Addended by: Maxwell Marion E on: 04/16/2020 11:39 AM   Modules accepted: Orders

## 2020-04-16 NOTE — Patient Instructions (Signed)
 Third Trimester of Pregnancy The third trimester is from week 28 through week 40 (months 7 through 9). The third trimester is a time when the unborn baby (fetus) is growing rapidly. At the end of the ninth month, the fetus is about 20 inches in length and weighs 6-10 pounds. Body changes during your third trimester Your body will continue to go through many changes during pregnancy. The changes vary from woman to woman. During the third trimester:  Your weight will continue to increase. You can expect to gain 25-35 pounds (11-16 kg) by the end of the pregnancy.  You may begin to get stretch marks on your hips, abdomen, and breasts.  You may urinate more often because the fetus is moving lower into your pelvis and pressing on your bladder.  You may develop or continue to have heartburn. This is caused by increased hormones that slow down muscles in the digestive tract.  You may develop or continue to have constipation because increased hormones slow digestion and cause the muscles that push waste through your intestines to relax.  You may develop hemorrhoids. These are swollen veins (varicose veins) in the rectum that can itch or be painful.  You may develop swollen, bulging veins (varicose veins) in your legs.  You may have increased body aches in the pelvis, back, or thighs. This is due to weight gain and increased hormones that are relaxing your joints.  You may have changes in your hair. These can include thickening of your hair, rapid growth, and changes in texture. Some women also have hair loss during or after pregnancy, or hair that feels dry or thin. Your hair will most likely return to normal after your baby is born.  Your breasts will continue to grow and they will continue to become tender. A yellow fluid (colostrum) may leak from your breasts. This is the first milk you are producing for your baby.  Your belly button may stick out.  You may notice more swelling in your  hands, face, or ankles.  You may have increased tingling or numbness in your hands, arms, and legs. The skin on your belly may also feel numb.  You may feel short of breath because of your expanding uterus.  You may have more problems sleeping. This can be caused by the size of your belly, increased need to urinate, and an increase in your body's metabolism.  You may notice the fetus "dropping," or moving lower in your abdomen (lightening).  You may have increased vaginal discharge.  You may notice your joints feel loose and you may have pain around your pelvic bone. What to expect at prenatal visits You will have prenatal exams every 2 weeks until week 36. Then you will have weekly prenatal exams. During a routine prenatal visit:  You will be weighed to make sure you and the baby are growing normally.  Your blood pressure will be taken.  Your abdomen will be measured to track your baby's growth.  The fetal heartbeat will be listened to.  Any test results from the previous visit will be discussed.  You may have a cervical check near your due date to see if your cervix has softened or thinned (effaced).  You will be tested for Group B streptococcus. This happens between 35 and 37 weeks. Your health care provider may ask you:  What your birth plan is.  How you are feeling.  If you are feeling the baby move.  If you have had any   abnormal symptoms, such as leaking fluid, bleeding, severe headaches, or abdominal cramping.  If you are using any tobacco products, including cigarettes, chewing tobacco, and electronic cigarettes.  If you have any questions. Other tests or screenings that may be performed during your third trimester include:  Blood tests that check for low iron levels (anemia).  Fetal testing to check the health, activity level, and growth of the fetus. Testing is done if you have certain medical conditions or if there are problems during the  pregnancy.  Nonstress test (NST). This test checks the health of your baby to make sure there are no signs of problems, such as the baby not getting enough oxygen. During this test, a belt is placed around your belly. The baby is made to move, and its heart rate is monitored during movement. What is false labor? False labor is a condition in which you feel small, irregular tightenings of the muscles in the womb (contractions) that usually go away with rest, changing position, or drinking water. These are called Braxton Hicks contractions. Contractions may last for hours, days, or even weeks before true labor sets in. If contractions come at regular intervals, become more frequent, increase in intensity, or become painful, you should see your health care provider. What are the signs of labor?  Abdominal cramps.  Regular contractions that start at 10 minutes apart and become stronger and more frequent with time.  Contractions that start on the top of the uterus and spread down to the lower abdomen and back.  Increased pelvic pressure and dull back pain.  A watery or bloody mucus discharge that comes from the vagina.  Leaking of amniotic fluid. This is also known as your "water breaking." It could be a slow trickle or a gush. Let your health care provider know if it has a color or strange odor. If you have any of these signs, call your health care provider right away, even if it is before your due date. Follow these instructions at home: Medicines  Follow your health care provider's instructions regarding medicine use. Specific medicines may be either safe or unsafe to take during pregnancy.  Take a prenatal vitamin that contains at least 600 micrograms (mcg) of folic acid.  If you develop constipation, try taking a stool softener if your health care provider approves. Eating and drinking   Eat a balanced diet that includes fresh fruits and vegetables, whole grains, good sources of protein  such as meat, eggs, or tofu, and low-fat dairy. Your health care provider will help you determine the amount of weight gain that is right for you.  Avoid raw meat and uncooked cheese. These carry germs that can cause birth defects in the baby.  If you have low calcium intake from food, talk to your health care provider about whether you should take a daily calcium supplement.  Eat four or five small meals rather than three large meals a day.  Limit foods that are high in fat and processed sugars, such as fried and sweet foods.  To prevent constipation: ? Drink enough fluid to keep your urine clear or pale yellow. ? Eat foods that are high in fiber, such as fresh fruits and vegetables, whole grains, and beans. Activity  Exercise only as directed by your health care provider. Most women can continue their usual exercise routine during pregnancy. Try to exercise for 30 minutes at least 5 days a week. Stop exercising if you experience uterine contractions.  Avoid heavy lifting.    Do not exercise in extreme heat or humidity, or at high altitudes.  Wear low-heel, comfortable shoes.  Practice good posture.  You may continue to have sex unless your health care provider tells you otherwise. Relieving pain and discomfort  Take frequent breaks and rest with your legs elevated if you have leg cramps or low back pain.  Take warm sitz baths to soothe any pain or discomfort caused by hemorrhoids. Use hemorrhoid cream if your health care provider approves.  Wear a good support bra to prevent discomfort from breast tenderness.  If you develop varicose veins: ? Wear support pantyhose or compression stockings as told by your healthcare provider. ? Elevate your feet for 15 minutes, 3-4 times a day. Prenatal care  Write down your questions. Take them to your prenatal visits.  Keep all your prenatal visits as told by your health care provider. This is important. Safety  Wear your seat belt at  all times when driving.  Make a list of emergency phone numbers, including numbers for family, friends, the hospital, and police and fire departments. General instructions  Avoid cat litter boxes and soil used by cats. These carry germs that can cause birth defects in the baby. If you have a cat, ask someone to clean the litter box for you.  Do not travel far distances unless it is absolutely necessary and only with the approval of your health care provider.  Do not use hot tubs, steam rooms, or saunas.  Do not drink alcohol.  Do not use any products that contain nicotine or tobacco, such as cigarettes and e-cigarettes. If you need help quitting, ask your health care provider.  Do not use any medicinal herbs or unprescribed drugs. These chemicals affect the formation and growth of the baby.  Do not douche or use tampons or scented sanitary pads.  Do not cross your legs for long periods of time.  To prepare for the arrival of your baby: ? Take prenatal classes to understand, practice, and ask questions about labor and delivery. ? Make a trial run to the hospital. ? Visit the hospital and tour the maternity area. ? Arrange for maternity or paternity leave through employers. ? Arrange for family and friends to take care of pets while you are in the hospital. ? Purchase a rear-facing car seat and make sure you know how to install it in your car. ? Pack your hospital bag. ? Prepare the baby's nursery. Make sure to remove all pillows and stuffed animals from the baby's crib to prevent suffocation.  Visit your dentist if you have not gone during your pregnancy. Use a soft toothbrush to brush your teeth and be gentle when you floss. Contact a health care provider if:  You are unsure if you are in labor or if your water has broken.  You become dizzy.  You have mild pelvic cramps, pelvic pressure, or nagging pain in your abdominal area.  You have lower back pain.  You have persistent  nausea, vomiting, or diarrhea.  You have an unusual or bad smelling vaginal discharge.  You have pain when you urinate. Get help right away if:  Your water breaks before 37 weeks.  You have regular contractions less than 5 minutes apart before 37 weeks.  You have a fever.  You are leaking fluid from your vagina.  You have spotting or bleeding from your vagina.  You have severe abdominal pain or cramping.  You have rapid weight loss or weight gain.  You   have shortness of breath with chest pain.  You notice sudden or extreme swelling of your face, hands, ankles, feet, or legs.  Your baby makes fewer than 10 movements in 2 hours.  You have severe headaches that do not go away when you take medicine.  You have vision changes. Summary  The third trimester is from week 28 through week 40, months 7 through 9. The third trimester is a time when the unborn baby (fetus) is growing rapidly.  During the third trimester, your discomfort may increase as you and your baby continue to gain weight. You may have abdominal, leg, and back pain, sleeping problems, and an increased need to urinate.  During the third trimester your breasts will keep growing and they will continue to become tender. A yellow fluid (colostrum) may leak from your breasts. This is the first milk you are producing for your baby.  False labor is a condition in which you feel small, irregular tightenings of the muscles in the womb (contractions) that eventually go away. These are called Braxton Hicks contractions. Contractions may last for hours, days, or even weeks before true labor sets in.  Signs of labor can include: abdominal cramps; regular contractions that start at 10 minutes apart and become stronger and more frequent with time; watery or bloody mucus discharge that comes from the vagina; increased pelvic pressure and dull back pain; and leaking of amniotic fluid. This information is not intended to replace advice  given to you by your health care provider. Make sure you discuss any questions you have with your health care provider. Document Revised: 08/08/2018 Document Reviewed: 05/23/2016 Else[employer] Patient Education  2020 [employer]   Contraception Choices Contraception, also called birth control, refers to methods or devices that prevent pregnancy. Hormonal methods Contraceptive implant  A contraceptive implant is a thin, plastic tube that contains a hormone. It is inserted into the upper part of the arm. It can remain in place for up to 3 years. Progestin-only injections Progestin-only injections are injections of progestin, a synthetic form of the hormone progesterone. They are given every 3 months by a health care provider. Birth control pills  Birth control pills are pills that contain hormones that prevent pregnancy. They must be taken once a day, preferably at the same time each day. Birth control patch  The birth control patch contains hormones that prevent pregnancy. It is placed on the skin and must be changed once a week for three weeks and removed on the fourth week. A prescription is needed to use this method of contraception. Vaginal ring  A vaginal ring contains hormones that prevent pregnancy. It is placed in the vagina for three weeks and removed on the fourth week. After that, the process is repeated with a new ring. A prescription is needed to use this method of contraception. Emergency contraceptive Emergency contraceptives prevent pregnancy after unprotected sex. They come in pill form and can be taken up to 5 days after sex. They work best the sooner they are taken after having sex. Most emergency contraceptives are available without a prescription. This method should not be used as your only form of birth control. Barrier methods Female condom  A female condom is a thin sheath that is worn over the penis during sex. Condoms keep sperm from going inside a woman's body. They can  be used with a spermicide to increase their effectiveness. They should be disposed after a single use. Female condom  A female condom is a soft,   loose-fitting sheath that is put into the vagina before sex. The condom keeps sperm from going inside a woman's body. They should be disposed after a single use. Diaphragm  A diaphragm is a soft, dome-shaped barrier. It is inserted into the vagina before sex, along with a spermicide. The diaphragm blocks sperm from entering the uterus, and the spermicide kills sperm. A diaphragm should be left in the vagina for 6-8 hours after sex and removed within 24 hours. A diaphragm is prescribed and fitted by a health care provider. A diaphragm should be replaced every 1-2 years, after giving birth, after gaining more than 15 lb (6.8 kg), and after pelvic surgery. Cervical cap  A cervical cap is a round, soft latex or plastic cup that fits over the cervix. It is inserted into the vagina before sex, along with spermicide. It blocks sperm from entering the uterus. The cap should be left in place for 6-8 hours after sex and removed within 48 hours. A cervical cap must be prescribed and fitted by a health care provider. It should be replaced every 2 years. Sponge  A sponge is a soft, circular piece of polyurethane foam with spermicide on it. The sponge helps block sperm from entering the uterus, and the spermicide kills sperm. To use it, you make it wet and then insert it into the vagina. It should be inserted before sex, left in for at least 6 hours after sex, and removed and thrown away within 30 hours. Spermicides Spermicides are chemicals that kill or block sperm from entering the cervix and uterus. They can come as a cream, jelly, suppository, foam, or tablet. A spermicide should be inserted into the vagina with an applicator at least 10-15 minutes before sex to allow time for it to work. The process must be repeated every time you have sex. Spermicides do not require  a prescription. Intrauterine contraception Intrauterine device (IUD) An IUD is a T-shaped device that is put in a woman's uterus. There are two types:  Hormone IUD.This type contains progestin, a synthetic form of the hormone progesterone. This type can stay in place for 3-5 years.  Copper IUD.This type is wrapped in copper wire. It can stay in place for 10 years.  Permanent methods of contraception Female tubal ligation In this method, a woman's fallopian tubes are sealed, tied, or blocked during surgery to prevent eggs from traveling to the uterus. Hysteroscopic sterilization In this method, a small, flexible insert is placed into each fallopian tube. The inserts cause scar tissue to form in the fallopian tubes and block them, so sperm cannot reach an egg. The procedure takes about 3 months to be effective. Another form of birth control must be used during those 3 months. Female sterilization This is a procedure to tie off the tubes that carry sperm (vasectomy). After the procedure, the man can still ejaculate fluid (semen). Natural planning methods Natural family planning In this method, a couple does not have sex on days when the woman could become pregnant. Calendar method This means keeping track of the length of each menstrual cycle, identifying the days when pregnancy can happen, and not having sex on those days. Ovulation method In this method, a couple avoids sex during ovulation. Symptothermal method This method involves not having sex during ovulation. The woman typically checks for ovulation by watching changes in her temperature and in the consistency of cervical mucus. Post-ovulation method In this method, a couple waits to have sex until after ovulation. Summary    Contraception, also called birth control, means methods or devices that prevent pregnancy.  Hormonal methods of contraception include implants, injections, pills, patches, vaginal rings, and emergency  contraceptives.  Barrier methods of contraception can include female condoms, female condoms, diaphragms, cervical caps, sponges, and spermicides.  There are two types of IUDs (intrauterine devices). An IUD can be put in a woman's uterus to prevent pregnancy for 3-5 years.  Permanent sterilization can be done through a procedure for males, females, or both.  Natural family planning methods involve not having sex on days when the woman could become pregnant. This information is not intended to replace advice given to you by your health care provider. Make sure you discuss any questions you have with your health care provider. Document Revised: 04/19/2017 Document Reviewed: 05/20/2016 [employer] Patient Education  2020 [employer]   Breastfeeding  Choosing to breastfeed is one of the best decisions you can make for yourself and your baby. A change in hormones during pregnancy causes your breasts to make breast milk in your milk-producing glands. Hormones prevent breast milk from being released before your baby is born. They also prompt milk flow after birth. Once breastfeeding has begun, thoughts of your baby, as well as his or her sucking or crying, can stimulate the release of milk from your milk-producing glands. Benefits of breastfeeding Research shows that breastfeeding offers many health benefits for infants and mothers. It also offers a cost-free and convenient way to feed your baby. For your baby  Your first milk (colostrum) helps your baby's digestive system to function better.  Special cells in your milk (antibodies) help your baby to fight off infections.  Breastfed babies are less likely to develop asthma, allergies, obesity, or type 2 diabetes. They are also at lower risk for sudden infant death syndrome (SIDS).  Nutrients in breast milk are better able to meet your baby's needs compared to infant formula.  Breast milk improves your baby's brain development. For  you  Breastfeeding helps to create a very special bond between you and your baby.  Breastfeeding is convenient. Breast milk costs nothing and is always available at the correct temperature.  Breastfeeding helps to burn calories. It helps you to lose the weight that you gained during pregnancy.  Breastfeeding makes your uterus return faster to its size before pregnancy. It also slows bleeding (lochia) after you give birth.  Breastfeeding helps to lower your risk of developing type 2 diabetes, osteoporosis, rheumatoid arthritis, cardiovascular disease, and breast, ovarian, uterine, and endometrial cancer later in life. Breastfeeding basics Starting breastfeeding  Find a comfortable place to sit or lie down, with your neck and back well-supported.  Place a pillow or a rolled-up blanket under your baby to bring him or her to the level of your breast (if you are seated). Nursing pillows are specially designed to help support your arms and your baby while you breastfeed.  Make sure that your baby's tummy (abdomen) is facing your abdomen.  Gently massage your breast. With your fingertips, massage from the outer edges of your breast inward toward the nipple. This encourages milk flow. If your milk flows slowly, you may need to continue this action during the feeding.  Support your breast with 4 fingers underneath and your thumb above your nipple (make the letter "C" with your hand). Make sure your fingers are well away from your nipple and your baby's mouth.  Stroke your baby's lips gently with your finger or nipple.  When your baby's mouth is open   wide enough, quickly bring your baby to your breast, placing your entire nipple and as much of the areola as possible into your baby's mouth. The areola is the colored area around your nipple. ? More areola should be visible above your baby's upper lip than below the lower lip. ? Your baby's lips should be opened and extended outward (flanged) to  ensure an adequate, comfortable latch. ? Your baby's tongue should be between his or her lower gum and your breast.  Make sure that your baby's mouth is correctly positioned around your nipple (latched). Your baby's lips should create a seal on your breast and be turned out (everted).  It is common for your baby to suck about 2-3 minutes in order to start the flow of breast milk. Latching Teaching your baby how to latch onto your breast properly is very important. An improper latch can cause nipple pain, decreased milk supply, and poor weight gain in your baby. Also, if your baby is not latched onto your nipple properly, he or she may swallow some air during feeding. This can make your baby fussy. Burping your baby when you switch breasts during the feeding can help to get rid of the air. However, teaching your baby to latch on properly is still the best way to prevent fussiness from swallowing air while breastfeeding. Signs that your baby has successfully latched onto your nipple  Silent tugging or silent sucking, without causing you pain. Infant's lips should be extended outward (flanged).  Swallowing heard between every 3-4 sucks once your milk has started to flow (after your let-down milk reflex occurs).  Muscle movement above and in front of his or her ears while sucking. Signs that your baby has not successfully latched onto your nipple  Sucking sounds or smacking sounds from your baby while breastfeeding.  Nipple pain. If you think your baby has not latched on correctly, slip your finger into the corner of your baby's mouth to break the suction and place it between your baby's gums. Attempt to start breastfeeding again. Signs of successful breastfeeding Signs from your baby  Your baby will gradually decrease the number of sucks or will completely stop sucking.  Your baby will fall asleep.  Your baby's body will relax.  Your baby will retain a small amount of milk in his or her  mouth.  Your baby will let go of your breast by himself or herself. Signs from you  Breasts that have increased in firmness, weight, and size 1-3 hours after feeding.  Breasts that are softer immediately after breastfeeding.  Increased milk volume, as well as a change in milk consistency and color by the fifth day of breastfeeding.  Nipples that are not sore, cracked, or bleeding. Signs that your baby is getting enough milk  Wetting at least 1-2 diapers during the first 24 hours after birth.  Wetting at least 5-6 diapers every 24 hours for the first week after birth. The urine should be clear or pale yellow by the age of 5 days.  Wetting 6-8 diapers every 24 hours as your baby continues to grow and develop.  At least 3 stools in a 24-hour period by the age of 5 days. The stool should be soft and yellow.  At least 3 stools in a 24-hour period by the age of 7 days. The stool should be seedy and yellow.  No loss of weight greater than 10% of birth weight during the first 3 days of life.  Average weight gain   of 4-7 oz (113-198 g) per week after the age of 4 days.  Consistent daily weight gain by the age of 5 days, without weight loss after the age of 2 weeks. After a feeding, your baby may spit up a small amount of milk. This is normal. Breastfeeding frequency and duration Frequent feeding will help you make more milk and can prevent sore nipples and extremely full breasts (breast engorgement). Breastfeed when you feel the need to reduce the fullness of your breasts or when your baby shows signs of hunger. This is called "breastfeeding on demand." Signs that your baby is hungry include:  Increased alertness, activity, or restlessness.  Movement of the head from side to side.  Opening of the mouth when the corner of the mouth or cheek is stroked (rooting).  Increased sucking sounds, smacking lips, cooing, sighing, or squeaking.  Hand-to-mouth movements and sucking on fingers or  hands.  Fussing or crying. Avoid introducing a pacifier to your baby in the first 4-6 weeks after your baby is born. After this time, you may choose to use a pacifier. Research has shown that pacifier use during the first year of a baby's life decreases the risk of sudden infant death syndrome (SIDS). Allow your baby to feed on each breast as long as he or she wants. When your baby unlatches or falls asleep while feeding from the first breast, offer the second breast. Because newborns are often sleepy in the first few weeks of life, you may need to awaken your baby to get him or her to feed. Breastfeeding times will vary from baby to baby. However, the following rules can serve as a guide to help you make sure that your baby is properly fed:  Newborns (babies 4 weeks of age or younger) may breastfeed every 1-3 hours.  Newborns should not go without breastfeeding for longer than 3 hours during the day or 5 hours during the night.  You should breastfeed your baby a minimum of 8 times in a 24-hour period. Breast milk pumping     Pumping and storing breast milk allows you to make sure that your baby is exclusively fed your breast milk, even at times when you are unable to breastfeed. This is especially important if you go back to work while you are still breastfeeding, or if you are not able to be present during feedings. Your lactation consultant can help you find a method of pumping that works best for you and give you guidelines about how long it is safe to store breast milk. Caring for your breasts while you breastfeed Nipples can become dry, cracked, and sore while breastfeeding. The following recommendations can help keep your breasts moisturized and healthy:  Avoid using soap on your nipples.  Wear a supportive bra designed especially for nursing. Avoid wearing underwire-style bras or extremely tight bras (sports bras).  Air-dry your nipples for 3-4 minutes after each feeding.  Use only  cotton bra pads to absorb leaked breast milk. Leaking of breast milk between feedings is normal.  Use lanolin on your nipples after breastfeeding. Lanolin helps to maintain your skin's normal moisture barrier. Pure lanolin is not harmful (not toxic) to your baby. You may also hand express a few drops of breast milk and gently massage that milk into your nipples and allow the milk to air-dry. In the first few weeks after giving birth, some women experience breast engorgement. Engorgement can make your breasts feel heavy, warm, and tender to the touch. Engorgement   peaks within 3-5 days after you give birth. The following recommendations can help to ease engorgement:  Completely empty your breasts while breastfeeding or pumping. You may want to start by applying warm, moist heat (in the shower or with warm, water-soaked hand towels) just before feeding or pumping. This increases circulation and helps the milk flow. If your baby does not completely empty your breasts while breastfeeding, pump any extra milk after he or she is finished.  Apply ice packs to your breasts immediately after breastfeeding or pumping, unless this is too uncomfortable for you. To do this: ? Put ice in a plastic bag. ? Place a towel between your skin and the bag. ? Leave the ice on for 20 minutes, 2-3 times a day.  Make sure that your baby is latched on and positioned properly while breastfeeding. If engorgement persists after 48 hours of following these recommendations, contact your health care provider or a lactation consultant. Overall health care recommendations while breastfeeding  Eat 3 healthy meals and 3 snacks every day. Well-nourished mothers who are breastfeeding need an additional 450-500 calories a day. You can meet this requirement by increasing the amount of a balanced diet that you eat.  Drink enough water to keep your urine pale yellow or clear.  Rest often, relax, and continue to take your prenatal vitamins  to prevent fatigue, stress, and low vitamin and mineral levels in your body (nutrient deficiencies).  Do not use any products that contain nicotine or tobacco, such as cigarettes and e-cigarettes. Your baby may be harmed by chemicals from cigarettes that pass into breast milk and exposure to secondhand smoke. If you need help quitting, ask your health care provider.  Avoid alcohol.  Do not use illegal drugs or marijuana.  Talk with your health care provider before taking any medicines. These include over-the-counter and prescription medicines as well as vitamins and herbal supplements. Some medicines that may be harmful to your baby can pass through breast milk.  It is possible to become pregnant while breastfeeding. If birth control is desired, ask your health care provider about options that will be safe while breastfeeding your baby. Where to find more information: [employer]: www.llli.org Contact a health care provider if:  You feel like you want to stop breastfeeding or have become frustrated with breastfeeding.  Your nipples are cracked or bleeding.  Your breasts are red, tender, or warm.  You have: ? Painful breasts or nipples. ? A swollen area on either breast. ? A fever or chills. ? Nausea or vomiting. ? Drainage other than breast milk from your nipples.  Your breasts do not become full before feedings by the fifth day after you give birth.  You feel sad and depressed.  Your baby is: ? Too sleepy to eat well. ? Having trouble sleeping. ? More than 1 week old and wetting fewer than 6 diapers in a 24-hour period. ? Not gaining weight by 5 days of age.  Your baby has fewer than 3 stools in a 24-hour period.  Your baby's skin or the white parts of his or her eyes become yellow. Get help right away if:  Your baby is overly tired (lethargic) and does not want to wake up and feed.  Your baby develops an unexplained fever. Summary  Breastfeeding  offers many health benefits for infant and mothers.  Try to breastfeed your infant when he or she shows early signs of hunger.  Gently tickle or stroke your baby's lips with   your finger or nipple to allow the baby to open his or her mouth. Bring the baby to your breast. Make sure that much of the areola is in your baby's mouth. Offer one side and burp the baby before you offer the other side.  Talk with your health care provider or lactation consultant if you have questions or you face problems as you breastfeed. This information is not intended to replace advice given to you by your health care provider. Make sure you discuss any questions you have with your health care provider. Document Revised: 07/12/2017 Document Reviewed: 05/19/2016 Elsevier Patient Education  2020 [employer].  

## 2020-04-26 ENCOUNTER — Other Ambulatory Visit: Payer: Self-pay

## 2020-04-26 ENCOUNTER — Encounter: Payer: Self-pay | Admitting: Certified Nurse Midwife

## 2020-04-26 ENCOUNTER — Ambulatory Visit (INDEPENDENT_AMBULATORY_CARE_PROVIDER_SITE_OTHER): Payer: Medicaid Other | Admitting: Certified Nurse Midwife

## 2020-04-26 VITALS — BP 105/61 | HR 62 | Wt 183.4 lb

## 2020-04-26 DIAGNOSIS — Z3480 Encounter for supervision of other normal pregnancy, unspecified trimester: Secondary | ICD-10-CM

## 2020-04-26 DIAGNOSIS — O99019 Anemia complicating pregnancy, unspecified trimester: Secondary | ICD-10-CM

## 2020-04-26 DIAGNOSIS — Z3A30 30 weeks gestation of pregnancy: Secondary | ICD-10-CM

## 2020-04-26 NOTE — Progress Notes (Signed)
   PRENATAL VISIT NOTE  Subjective:  Andrea Foster is a 29 y.o. G3P2002 at [redacted]w[redacted]d being seen today for ongoing prenatal care.  She is currently monitored for the following issues for this low-risk pregnancy and has Short interval between pregnancies affecting pregnancy in first trimester, antepartum; History of gestational hypertension; Normal labor; Supervision of other normal pregnancy, antepartum; Anemia in pregnancy; and Fetal echogenic intracardiac focus on prenatal ultrasound on their problem list.  Patient reports no complaints.  Contractions: Irritability. Vag. Bleeding: None.  Movement: Present. Denies leaking of fluid.   The following portions of the patient's history were reviewed and updated as appropriate: allergies, current medications, past family history, past medical history, past social history, past surgical history and problem list.   Objective:   Vitals:   04/26/20 1101  BP: 105/61  Pulse: 62  Weight: 183 lb 6.4 oz (83.2 kg)    Fetal Status: Fetal Heart Rate (bpm): 142 Fundal Height: 32 cm Movement: Present     General:  Alert, oriented and cooperative. Patient is in no acute distress.  Skin: Skin is warm and dry. No rash noted.   Cardiovascular: Normal heart rate noted  Respiratory: Normal respiratory effort, no problems with respiration noted  Abdomen: Soft, gravid, appropriate for gestational age.  Pain/Pressure: Present     Pelvic: Cervical exam deferred        Extremities: Normal range of motion.  Edema: None  Mental Status: Normal mood and affect. Normal behavior. Normal judgment and thought content.   Assessment and Plan:  Pregnancy: G3P2002 at [redacted]w[redacted]d 1. Supervision of other normal pregnancy, antepartum - Patient doing well, no complaints  - Routine prenatal care - Anticipatory guidance on upcoming appointments  - Answered patient's questions on waterbirth   2. Antepartum anemia - continue to take iron supplementation   3. [redacted] weeks gestation of  pregnancy - Waterbirth class scheduled for 05/06/19  Preterm labor symptoms and general obstetric precautions including but not limited to vaginal bleeding, contractions, leaking of fluid and fetal movement were reviewed in detail with the patient. Please refer to After Visit Summary for other counseling recommendations.   Return in about 2 weeks (around 05/10/2020) for LROB, in person with midwife or APP.  Future Appointments  Date Time Provider Department Center  05/07/2020  9:30 AM [facility] NURSE [facility]  05/07/2020  9:45 AM [facility] US5 [facility]  05/14/2020  9:15 AM Bernerd Limbo, CNM [facility]    Sharyon Cable, CNM

## 2020-04-26 NOTE — Patient Instructions (Signed)

## 2020-05-01 DIAGNOSIS — Z419 Encounter for procedure for purposes other than remedying health state, unspecified: Secondary | ICD-10-CM | POA: Diagnosis not present

## 2020-05-01 NOTE — L&D Delivery Note (Addendum)
OB/GYN Faculty Practice Delivery Note  Andrea Foster is a 30 y.o. G3P2002 s/p SVD at [redacted]w[redacted]d. She was admitted for labor.   ROM: 0h 30m with clear fluid GBS Status:  Negative/-- (02/10 1426) Maximum Maternal Temperature: 98  Labor Progress: . Initial SVE: 7cm. She then progressed to complete.   Delivery Date/Time: 3/4 at 0740 Delivery: Called to room and patient was complete and pushing. Head delivered LOA. No nuchal cord present. Shoulder and body delivered in usual fashion. Infant with spontaneous cry, placed on mother's abdomen, dried and stimulated. Cord clamped x 2 after 1-minute delay, and cut by FOB. Cord blood drawn. Placenta delivered spontaneously with gentle cord traction. Fundus firm with massage and Pitocin. Labia, perineum, vagina, and cervix inspected inspected with no lacerations.  Baby Weight: pending  Placenta: Sent to L&D Complications: None Lacerations: none EBL: 25 mL Analgesia: epidural  Infant:  APGAR (1 MIN): 8   APGAR (5 MINS): 9   APGAR (10 MINS):     Casper Harrison, MD [facility] Family Medicine Fellow, [facility] for [facility], [facility] 07/02/2020, 8:36 AM

## 2020-05-07 ENCOUNTER — Ambulatory Visit: Payer: Medicaid Other | Attending: Obstetrics and Gynecology

## 2020-05-07 ENCOUNTER — Other Ambulatory Visit: Payer: Self-pay

## 2020-05-07 ENCOUNTER — Other Ambulatory Visit: Payer: Self-pay | Admitting: *Deleted

## 2020-05-07 ENCOUNTER — Ambulatory Visit: Payer: Medicaid Other | Admitting: *Deleted

## 2020-05-07 ENCOUNTER — Encounter: Payer: Self-pay | Admitting: *Deleted

## 2020-05-07 DIAGNOSIS — O99013 Anemia complicating pregnancy, third trimester: Secondary | ICD-10-CM | POA: Diagnosis not present

## 2020-05-07 DIAGNOSIS — Z362 Encounter for other antenatal screening follow-up: Secondary | ICD-10-CM | POA: Diagnosis not present

## 2020-05-07 DIAGNOSIS — Z3480 Encounter for supervision of other normal pregnancy, unspecified trimester: Secondary | ICD-10-CM | POA: Insufficient documentation

## 2020-05-07 DIAGNOSIS — D649 Anemia, unspecified: Secondary | ICD-10-CM | POA: Diagnosis not present

## 2020-05-07 DIAGNOSIS — O402XX0 Polyhydramnios, second trimester, not applicable or unspecified: Secondary | ICD-10-CM | POA: Diagnosis not present

## 2020-05-07 DIAGNOSIS — Z8759 Personal history of other complications of pregnancy, childbirth and the puerperium: Secondary | ICD-10-CM | POA: Diagnosis not present

## 2020-05-07 DIAGNOSIS — O358XX0 Maternal care for other (suspected) fetal abnormality and damage, not applicable or unspecified: Secondary | ICD-10-CM

## 2020-05-07 DIAGNOSIS — O403XX1 Polyhydramnios, third trimester, fetus 1: Secondary | ICD-10-CM | POA: Diagnosis not present

## 2020-05-07 DIAGNOSIS — O35HXX0 Maternal care for other (suspected) fetal abnormality and damage, fetal lower extremities anomalies, not applicable or unspecified: Secondary | ICD-10-CM

## 2020-05-07 DIAGNOSIS — Z3A31 31 weeks gestation of pregnancy: Secondary | ICD-10-CM | POA: Insufficient documentation

## 2020-05-14 ENCOUNTER — Ambulatory Visit (INDEPENDENT_AMBULATORY_CARE_PROVIDER_SITE_OTHER): Payer: Medicaid Other | Admitting: Certified Nurse Midwife

## 2020-05-14 ENCOUNTER — Other Ambulatory Visit: Payer: Self-pay

## 2020-05-14 VITALS — BP 104/70 | HR 77 | Wt 181.3 lb

## 2020-05-14 DIAGNOSIS — Z3A32 32 weeks gestation of pregnancy: Secondary | ICD-10-CM

## 2020-05-14 DIAGNOSIS — Z3480 Encounter for supervision of other normal pregnancy, unspecified trimester: Secondary | ICD-10-CM

## 2020-05-14 NOTE — Progress Notes (Signed)
   PRENATAL VISIT NOTE  Subjective:  Andrea Foster is a 30 y.o. G3P2002 at [redacted]w[redacted]d being seen today for ongoing prenatal care.  She is currently monitored for the following issues for this low-risk pregnancy and has Short interval between pregnancies affecting pregnancy in first trimester, antepartum; History of gestational hypertension; Normal labor; Supervision of other normal pregnancy, antepartum; Anemia in pregnancy; and Fetal echogenic intracardiac focus on prenatal ultrasound on their problem list.  Patient reports no complaints.  Contractions: Irritability. Vag. Bleeding: None.  Movement: Present. Denies leaking of fluid.   The following portions of the patient's history were reviewed and updated as appropriate: allergies, current medications, past family history, past medical history, past social history, past surgical history and problem list.   Objective:   Vitals:   05/14/20 0936  BP: 104/70  Pulse: 77  Weight: 181 lb 4.8 oz (82.2 kg)    Fetal Status: Fetal Heart Rate (bpm): 146 Fundal Height: 33 cm Movement: Present     General:  Alert, oriented and cooperative. Patient is in no acute distress.  Skin: Skin is warm and dry. No rash noted.   Cardiovascular: Normal heart rate noted  Respiratory: Normal respiratory effort, no problems with respiration noted  Abdomen: Soft, gravid, appropriate for gestational age.  Pain/Pressure: Absent     Pelvic: Cervical exam deferred        Extremities: Normal range of motion.  Edema: None  Mental Status: Normal mood and affect. Normal behavior. Normal judgment and thought content.   Assessment and Plan:  Pregnancy: G3P2002 at [redacted]w[redacted]d 1. Supervision of other normal pregnancy, antepartum - Pt doing well without current complaints  2. [redacted] weeks gestation of pregnancy - Routine OB care - Pt is scheduled for the 05/20/20 waterbirth class  - Risks/benefits and limitations/restrictions of waterbirth discussed with pt, consent signed - Pt  will bring certificate to next visit - Anticipatory guidance given regarding next visits   Preterm labor symptoms and general obstetric precautions including but not limited to vaginal bleeding, contractions, leaking of fluid and fetal movement were reviewed in detail with the patient. Please refer to After Visit Summary for other counseling recommendations.   Return in about 2 weeks (around 05/28/2020) for IN-PERSON, LOB.  Future Appointments  Date Time Provider Department Center  05/31/2020  2:55 PM [facility]  06/04/2020 11:15 AM [facility] NURSE [facility]  06/04/2020 11:30 AM [facility] US3 [facility]    Bernerd Limbo, CNM

## 2020-05-31 ENCOUNTER — Encounter: Payer: Self-pay | Admitting: Certified Nurse Midwife

## 2020-05-31 ENCOUNTER — Telehealth (INDEPENDENT_AMBULATORY_CARE_PROVIDER_SITE_OTHER): Payer: Medicaid Other | Admitting: Certified Nurse Midwife

## 2020-05-31 ENCOUNTER — Other Ambulatory Visit: Payer: Self-pay

## 2020-05-31 VITALS — BP 126/73 | HR 63

## 2020-05-31 DIAGNOSIS — N9489 Other specified conditions associated with female genital organs and menstrual cycle: Secondary | ICD-10-CM

## 2020-05-31 DIAGNOSIS — O26893 Other specified pregnancy related conditions, third trimester: Secondary | ICD-10-CM

## 2020-05-31 DIAGNOSIS — Z3A35 35 weeks gestation of pregnancy: Secondary | ICD-10-CM

## 2020-05-31 DIAGNOSIS — Z8759 Personal history of other complications of pregnancy, childbirth and the puerperium: Secondary | ICD-10-CM

## 2020-05-31 DIAGNOSIS — Z3480 Encounter for supervision of other normal pregnancy, unspecified trimester: Secondary | ICD-10-CM

## 2020-05-31 DIAGNOSIS — O99013 Anemia complicating pregnancy, third trimester: Secondary | ICD-10-CM

## 2020-05-31 DIAGNOSIS — O09293 Supervision of pregnancy with other poor reproductive or obstetric history, third trimester: Secondary | ICD-10-CM

## 2020-05-31 DIAGNOSIS — O99019 Anemia complicating pregnancy, unspecified trimester: Secondary | ICD-10-CM

## 2020-05-31 DIAGNOSIS — D649 Anemia, unspecified: Secondary | ICD-10-CM

## 2020-05-31 NOTE — Progress Notes (Signed)
2:47 Andrea Foster not connected virtually for her virtual visit. I called and left a message I am calling for your virtual visit- please go ahead and join the visit virtually if you can . Linda,RN  I connected with  Andrea Foster on 05/31/20 at  2:55 PM EST by telephone and verified that I am speaking with the correct person using two identifiers.   I discussed the limitations, risks, security and privacy concerns of performing an evaluation and management service by telephone and the availability of in person appointments. I also discussed with the patient that there may be a patient responsible charge related to this service. The patient expressed understanding and agreed to proceed.  States hasn't been able to get into [employer] . We discussed it may be related to her having recent pregnancy with [employer]. I will email [employer] to get them to close previous and resend . She took her bp during visit today.  c/o calf muscles tight.   Linda,RN 05/31/2020  2:54 PM

## 2020-05-31 NOTE — Progress Notes (Signed)
OBSTETRICS PRENATAL VIRTUAL VISIT ENCOUNTER NOTE  Provider location: [facility] for [facility]   I connected with Andrea Foster on 05/31/20 at  3:10 PM EST by MyChart Video Encounter at home and verified that I am speaking with the correct person using two identifiers.   I discussed the limitations, risks, security and privacy concerns of performing an evaluation and management service virtually and the availability of in person appointments. I also discussed with the patient that there may be a patient responsible charge related to this service. The patient expressed understanding and agreed to proceed. Subjective:  Andrea Foster is a 30 y.o. G3P2002 at [redacted]w[redacted]d being seen today for ongoing prenatal care.  She is currently monitored for the following issues for this low-risk pregnancy and has Short interval between pregnancies affecting pregnancy in first trimester, antepartum; History of gestational hypertension; Normal labor; Supervision of other normal pregnancy, antepartum; Anemia in pregnancy; and Fetal echogenic intracardiac focus on prenatal ultrasound on their problem list.  Patient reports hip and pelvic pressure.  Contractions: Not present. Vag. Bleeding: None.  Movement: Present. Denies any leaking of fluid.   The following portions of the patient's history were reviewed and updated as appropriate: allergies, current medications, past family history, past medical history, past social history, past surgical history and problem list.   Objective:   Vitals:   05/31/20 1503  BP: 126/73  Pulse: 63    Fetal Status:     Movement: Present     General:  Alert, oriented and cooperative. Patient is in no acute distress.  Respiratory: Normal respiratory effort, no problems with respiration noted  Mental Status: Normal mood and affect. Normal behavior. Normal judgment and thought content.  Rest of physical exam deferred due to type of  encounter  Imaging: [place] MFM OB FOLLOW UP  Result Date: 05/07/2020 ----------------------------------------------------------------------  OBSTETRICS REPORT                       (Signed Final 05/07/2020 11:19 am) ---------------------------------------------------------------------- Patient Info  ID #:       [id number]                          D.O.B.:  April 28, 1991 (29 yrs)  Name:       Andrea Foster               Visit Date: 05/07/2020 09:50 am ---------------------------------------------------------------------- Performed By  Attending:        Noralee Space MD        Ref. Address:     Faculty Practice  Performed By:     Jenel Lucks     [facility]                                                             [facility]  Referred By:      Gerrit HeckJESSICA EMLY                    CNM ---------------------------------------------------------------------- Orders  #  Description                           Code        Ordered By  1  US MFM OB FOLLOW UP                   [id number]    Lin LandsmanORENTHIAN                                                       BOOKER ----------------------------------------------------------------------  #  Order #                     Accession #                Episode #  1  [id number]                   [id number]                 [id number] ---------------------------------------------------------------------- Indications  [redacted] weeks gestation of pregnancy                Z3A.31  Encounter for other antenatal screening        Z36.2  follow-up  Anemia during pregnancy in third trimester     O99.013  Polyhydramnios, third trimester, antepartum    O40.3XX1  condition or complication, fetus 1 ---------------------------------------------------------------------- Fetal Evaluation  Num Of Fetuses:         1  Fetal Heart Rate(bpm):  140  Cardiac  Activity:       Observed  Presentation:           Cephalic  Placenta:               Posterior Fundal  P. Cord Insertion:      Previously Visualized  Amniotic Fluid  AFI FV:      Within normal limits  AFI Sum(cm)     %Tile       Largest Pocket(cm)  20.6            79          9.9  RUQ(cm)       RLQ(cm)       LUQ(cm)        LLQ(cm)  5.3           4.5           6.1            4.8 ---------------------------------------------------------------------- Biometry  BPD:      83.4  mm     G. Age:  33w 4d         91  %    CI:        76.14   %    70 - 86  FL/HC:      18.1   %    19.1 - 21.3  HC:      302.9  mm     G. Age:  33w 5d         70  %    HC/AC:      1.03        0.96 - 1.17  AC:      293.9  mm     G. Age:  33w 3d         91  %    FL/BPD:     65.6   %    71 - 87  FL:       54.7  mm     G. Age:  28w 6d        < 1  %    FL/AC:      18.6   %    20 - 24  ULN:        44  mm     G. Age:  28w 2d        < 5  %  TIB:      51.6  mm     G. Age:  30w 5d         36  %  RAD:      49.8  mm     G. Age:  38w 6d         82  %  FIB:      48.9  mm     G. Age:  30w 0d         49  %  Est. FW:    1907  gm      4 lb 3 oz     57  % ---------------------------------------------------------------------- OB History  Gravidity:    3  Living:       2 ---------------------------------------------------------------------- Gestational Age  LMP:           31w 4d        Date:  09/29/19                 EDD:   07/05/20  U/S Today:     32w 3d                                        EDD:   06/29/20  Best:          31w 4d     Det. By:  LMP  (09/29/19)          EDD:   07/05/20 ---------------------------------------------------------------------- Anatomy  Cranium:               Appears normal         Aortic Arch:            Previously seen  Cavum:                 Previously seen        Ductal Arch:            Previously seen  Ventricles:            Appears normal         Diaphragm:              Appears  normal  Choroid Plexus:        Previously  seen        Stomach:                Appears normal, left                                                                        sided  Cerebellum:            Previously seen        Abdomen:                Previously seen  Posterior Fossa:       Previously seen        Abdominal Wall:         Previously seen  Nuchal Fold:           Previously seen        Cord Vessels:           Previously seen  Lips:                  Previously seen        Kidneys:                Appear normal  Palate:                Not well visualized    Bladder:                Appears normal  Thoracic:              Previously seen        Spine:                  Previously seen  Heart:                 Appears normal;        Upper Extremities:      Previously seen                         EIF prev.  RVOT:                  Previously seen        Lower Extremities:      Previously seen  LVOT:                  Previously seen  Other:  Heels and open hands/5th digits visualized. Fetus appears to be a          female. Technically difficult due to advanced GA and fetal position. ---------------------------------------------------------------------- Cervix Uterus Adnexa  Cervix  Not visualized (advanced GA >24wks)  Uterus  No abnormality visualized.  Right Ovary  Not visualized.  Left Ovary  Within normal limits. ---------------------------------------------------------------------- Impression  Patient returns for fetal growth assessment.  Polyhydramnios  was seen on previous ultrasound.  She does not have  gestational diabetes.  On today's ultrasound, amniotic fluid is normal and good fetal  activity seen.  Fetal growth is appropriate for gestational age.  Female length measurement is at less than the 5th  percentile.  Over the long bone measurements (except ulna)  are within normal range.  All long bones appear normal with  no bowing or fractures are hyper mineralization.  A slightly concave profile and flat nose is  seen on 2D and 3D  images.  Her previous children also have flat profiles and  nose, but are healthy with no orthodontic problems.  To the differential diagnosis is Binder syndrome (which has  good prognosis), this finding is more likely to be familial and  normal.  I discussed the finding of a short female length but otherwise  normal fetal growth. ---------------------------------------------------------------------- Recommendations  -An appointment was made for her to return in 4 weeks for  fetal growth assessment. ----------------------------------------------------------------------                  Noralee Space, MD Electronically Signed Final Report   05/07/2020 11:19 am ----------------------------------------------------------------------    Assessment and Plan:  Pregnancy: [id number] at [redacted]w[redacted]d 1. Supervision of other normal pregnancy, antepartum - Patient doing well, reports occasional hip and pelvic pressure especially when patient gets off work. Discussed with patient use of maternity support belt and hot showers or warm compresses to help with back/hip pain.  - routine prenatal care  - anticipatory guidance on upcoming appointments including GBS at next visit, discussed recommendations on antibiotics during labor if positive, discussed with patient that she can still have waterbirth whether she is positive or negative.  - educated and discussed with patient that next appointment will discussed cervical ripening  - patient completed WB class on 1/20, will send certificate of completion through mychart or bring with her at next appointment   2. Antepartum anemia - continue to take iron supplementation, encouraged patient to take every other day   3. History of gestational hypertension - BP normal at appointment today   4. [redacted] weeks gestation of pregnancy  Preterm labor symptoms and general obstetric precautions including but not limited to vaginal bleeding, contractions, leaking of fluid and  fetal movement were reviewed in detail with the patient. I discussed the assessment and treatment plan with the patient. The patient was provided an opportunity to ask questions and all were answered. The patient agreed with the plan and demonstrated an understanding of the instructions. The patient was advised to call back or seek an in-person office evaluation/go to [facility] at [facility] for any urgent or concerning symptoms. Please refer to After Visit Summary for other counseling recommendations.   I provided 13 minutes of face-to-face time during this encounter.  Return in about 1 week (around 06/07/2020) for LROB, GBS, in person with midwife.  Future Appointments  Date Time Provider Department Center  06/04/2020 11:15 AM [facility] NURSE [facility]  06/04/2020 11:30 AM [facility] US3 [facility]  06/10/2020  2:15 PM Bernerd Limbo, CNM [facility]    Sharyon Cable, CNM Center for [employer], [facility]

## 2020-05-31 NOTE — Patient Instructions (Signed)

## 2020-06-01 DIAGNOSIS — Z419 Encounter for procedure for purposes other than remedying health state, unspecified: Secondary | ICD-10-CM | POA: Diagnosis not present

## 2020-06-04 ENCOUNTER — Other Ambulatory Visit: Payer: Self-pay

## 2020-06-04 ENCOUNTER — Ambulatory Visit: Payer: Medicaid Other | Admitting: *Deleted

## 2020-06-04 ENCOUNTER — Ambulatory Visit: Payer: Medicaid Other | Attending: Obstetrics and Gynecology

## 2020-06-04 ENCOUNTER — Encounter: Payer: Self-pay | Admitting: *Deleted

## 2020-06-04 DIAGNOSIS — O99013 Anemia complicating pregnancy, third trimester: Secondary | ICD-10-CM | POA: Diagnosis not present

## 2020-06-04 DIAGNOSIS — O403XX0 Polyhydramnios, third trimester, not applicable or unspecified: Secondary | ICD-10-CM | POA: Diagnosis not present

## 2020-06-04 DIAGNOSIS — O09293 Supervision of pregnancy with other poor reproductive or obstetric history, third trimester: Secondary | ICD-10-CM

## 2020-06-04 DIAGNOSIS — Z3480 Encounter for supervision of other normal pregnancy, unspecified trimester: Secondary | ICD-10-CM | POA: Insufficient documentation

## 2020-06-04 DIAGNOSIS — Z3A35 35 weeks gestation of pregnancy: Secondary | ICD-10-CM | POA: Diagnosis not present

## 2020-06-04 DIAGNOSIS — O358XX0 Maternal care for other (suspected) fetal abnormality and damage, not applicable or unspecified: Secondary | ICD-10-CM | POA: Diagnosis not present

## 2020-06-04 DIAGNOSIS — O35HXX0 Maternal care for other (suspected) fetal abnormality and damage, fetal lower extremities anomalies, not applicable or unspecified: Secondary | ICD-10-CM

## 2020-06-10 ENCOUNTER — Ambulatory Visit (INDEPENDENT_AMBULATORY_CARE_PROVIDER_SITE_OTHER): Payer: Medicaid Other | Admitting: Certified Nurse Midwife

## 2020-06-10 ENCOUNTER — Other Ambulatory Visit (HOSPITAL_COMMUNITY)
Admission: RE | Admit: 2020-06-10 | Discharge: 2020-06-10 | Disposition: A | Discharge status: Home / Self Care | Payer: Medicaid Other | Source: Ambulatory Visit | Attending: Certified Nurse Midwife | Admitting: Certified Nurse Midwife

## 2020-06-10 ENCOUNTER — Other Ambulatory Visit: Payer: Self-pay

## 2020-06-10 VITALS — BP 100/64 | HR 75 | Wt 186.0 lb

## 2020-06-10 DIAGNOSIS — Z3480 Encounter for supervision of other normal pregnancy, unspecified trimester: Secondary | ICD-10-CM

## 2020-06-10 DIAGNOSIS — Z3A36 36 weeks gestation of pregnancy: Secondary | ICD-10-CM

## 2020-06-11 LAB — GC/CHLAMYDIA PROBE AMP (~~LOC~~) NOT AT ARMC
Chlamydia: NEGATIVE
Comment: NEGATIVE
Comment: NORMAL
Neisseria Gonorrhea: NEGATIVE

## 2020-06-11 NOTE — Progress Notes (Signed)
   PRENATAL VISIT NOTE  Subjective:  Andrea Foster is a 30 y.o. G3P2002 at [redacted]w[redacted]d being seen today for ongoing prenatal care.  She is currently monitored for the following issues for this low-risk pregnancy and has Short interval between pregnancies affecting pregnancy in first trimester, antepartum; History of gestational hypertension; Normal labor; Supervision of other normal pregnancy, antepartum; Anemia in pregnancy; and Fetal echogenic intracardiac focus on prenatal ultrasound on their problem list.  Patient reports no complaints.  Contractions: Not present. Vag. Bleeding: None.  Movement: Present. Denies leaking of fluid.   The following portions of the patient's history were reviewed and updated as appropriate: allergies, current medications, past family history, past medical history, past social history, past surgical history and problem list.   Objective:   Vitals:   06/10/20 1429  BP: 100/64  Pulse: 75  Weight: 186 lb (84.4 kg)    Fetal Status: Fetal Heart Rate (bpm): 153 Fundal Height: 36 cm Movement: Present     General:  Alert, oriented and cooperative. Patient is in no acute distress.  Skin: Skin is warm and dry. No rash noted.   Cardiovascular: Normal heart rate noted  Respiratory: Normal respiratory effort, no problems with respiration noted  Abdomen: Soft, gravid, appropriate for gestational age.  Pain/Pressure: Present     Pelvic: Cervical exam deferred        Extremities: Normal range of motion.  Edema: None  Mental Status: Normal mood and affect. Normal behavior. Normal judgment and thought content.   Assessment and Plan:  Pregnancy: G3P2002 at [redacted]w[redacted]d 1. Supervision of other normal pregnancy, antepartum - Pt doing well, feeling regular fetal movement and occasional contractions   2. [redacted] weeks gestation of pregnancy - will upload waterbirth certificate - Routine OB care - Culture, beta strep (group b only) - GC/Chlamydia probe amp ([place])not at  [facility]  Preterm labor symptoms and general obstetric precautions including but not limited to vaginal bleeding, contractions, leaking of fluid and fetal movement were reviewed in detail with the patient. Please refer to After Visit Summary for other counseling recommendations.   Future Appointments  Date Time Provider Department Center  06/17/2020 10:35 AM Crisoforo Oxford, Charlesetta Garibaldi, CNM [facility]   Edd Arbour, CNM, MSN, [facility] Certified Nurse Midwife, [facility]

## 2020-06-14 LAB — CULTURE, BETA STREP (GROUP B ONLY): Strep Gp B Culture: NEGATIVE

## 2020-06-17 ENCOUNTER — Telehealth (INDEPENDENT_AMBULATORY_CARE_PROVIDER_SITE_OTHER): Payer: Medicaid Other | Admitting: Student

## 2020-06-17 ENCOUNTER — Other Ambulatory Visit: Payer: Self-pay

## 2020-06-17 DIAGNOSIS — O26893 Other specified pregnancy related conditions, third trimester: Secondary | ICD-10-CM | POA: Diagnosis not present

## 2020-06-17 DIAGNOSIS — Z3A37 37 weeks gestation of pregnancy: Secondary | ICD-10-CM | POA: Diagnosis not present

## 2020-06-17 DIAGNOSIS — O99013 Anemia complicating pregnancy, third trimester: Secondary | ICD-10-CM | POA: Diagnosis not present

## 2020-06-17 DIAGNOSIS — O358XX0 Maternal care for other (suspected) fetal abnormality and damage, not applicable or unspecified: Secondary | ICD-10-CM

## 2020-06-17 DIAGNOSIS — Z3480 Encounter for supervision of other normal pregnancy, unspecified trimester: Secondary | ICD-10-CM

## 2020-06-17 NOTE — Progress Notes (Signed)
I connected with  Andrea Foster on 06/17/20 at 10:35 AM EST by telephone and verified that I am speaking with the correct person using two identifiers.   I discussed the limitations, risks, security and privacy concerns of performing an evaluation and management service by telephone and the availability of in person appointments. I also discussed with the patient that there may be a patient responsible charge related to this service. The patient expressed understanding and agreed to proceed.  Ralene Bathe, RN 06/17/2020  10:37 AM

## 2020-06-17 NOTE — Addendum Note (Signed)
Addended by: Chrystie Nose on: 06/17/2020 12:08 PM   Modules accepted: Level of Service

## 2020-06-17 NOTE — Progress Notes (Addendum)
Patient ID: Andrea Foster, female   DOB: 07-15-1990, 30 y.o.   MRN: [id number]   PRENATAL VISIT NOTE  Subjective:  XEE HOLLMAN is a 30 y.o. G3P2002 at [redacted]w[redacted]d being seen today for ongoing prenatal care.  She is currently monitored for the following issues for this low-risk pregnancy and has Short interval between pregnancies affecting pregnancy in first trimester, antepartum; History of gestational hypertension; Normal labor; Supervision of other normal pregnancy, antepartum; Anemia in pregnancy; and Fetal echogenic intracardiac focus on prenatal ultrasound on their problem list.  Patient reports no complaints.  Contractions: Not present. Vag. Bleeding: None.  Movement: Present. Denies leaking of fluid.   The following portions of the patient's history were reviewed and updated as appropriate: allergies, current medications, past family history, past medical history, past social history, past surgical history and problem list.   Objective:  There were no vitals filed for this visit.  Fetal Status:     Movement: Present     General:  Alert, oriented and cooperative. Patient is in no acute distress.  Skin: Skin is warm and dry. No rash noted.   Cardiovascular: Normal heart rate noted  Respiratory: Normal respiratory effort, no problems with respiration noted  Abdomen: Soft, gravid, appropriate for gestational age.  Pain/Pressure: Present     Pelvic: Cervical exam deferred        Extremities: Normal range of motion.  Edema: None  Mental Status: Normal mood and affect. Normal behavior. Normal judgment and thought content.   Assessment and Plan:  Pregnancy: G3P2002 at [redacted]w[redacted]d 1. Supervision of other normal pregnancy, antepartum _Patient doesn't have BP cuff right now; she will check when she gets home. Reviewed warning values and whent o come to MAU - Enroll patient in Babyscripts Program -reviewed WB exclusions -reviewed signs and labor; when to come to hospital  Term labor symptoms  and general obstetric precautions including but not limited to vaginal bleeding, contractions, leaking of fluid and fetal movement were reviewed in detail with the patient. Please refer to After Visit Summary for other counseling recommendations.   Time spent on telephone encounter was 12 minutes.   Return in about 2 weeks (around 07/01/2020), or LROB in person with KK or Jamilla.  No future appointments.  Marylene Land, CNM

## 2020-06-17 NOTE — Progress Notes (Deleted)
Patient ID: Andrea Foster, female   DOB: 08/14/1990, 31 y.o.   MRN: [id number] Patient did not keep appt. She should be rescheduled.

## 2020-06-29 ENCOUNTER — Encounter: Payer: Medicaid Other | Admitting: Family Medicine

## 2020-06-29 DIAGNOSIS — Z419 Encounter for procedure for purposes other than remedying health state, unspecified: Secondary | ICD-10-CM | POA: Diagnosis not present

## 2020-07-01 ENCOUNTER — Encounter: Payer: Medicaid Other | Admitting: Advanced Practice Midwife

## 2020-07-01 ENCOUNTER — Other Ambulatory Visit: Payer: Self-pay

## 2020-07-01 ENCOUNTER — Ambulatory Visit (INDEPENDENT_AMBULATORY_CARE_PROVIDER_SITE_OTHER): Payer: Medicaid Other | Admitting: Advanced Practice Midwife

## 2020-07-01 VITALS — BP 103/69 | HR 75 | Wt 192.0 lb

## 2020-07-01 DIAGNOSIS — Z3480 Encounter for supervision of other normal pregnancy, unspecified trimester: Secondary | ICD-10-CM

## 2020-07-01 DIAGNOSIS — Z3A39 39 weeks gestation of pregnancy: Secondary | ICD-10-CM

## 2020-07-01 NOTE — Patient Instructions (Signed)

## 2020-07-01 NOTE — Progress Notes (Signed)
   PRENATAL VISIT NOTE  Subjective:  Andrea Foster is a 30 y.o. G3P2002 at [redacted]w[redacted]d being seen today for ongoing prenatal care.  She is currently monitored for the following issues for this low-risk pregnancy and has Short interval between pregnancies affecting pregnancy in first trimester, antepartum; History of gestational hypertension; Normal labor; Supervision of other normal pregnancy, antepartum; Anemia in pregnancy; and Fetal echogenic intracardiac focus on prenatal ultrasound on their problem list.  Patient reports occasional contractions.  Contractions: Irritability. Vag. Bleeding: None.  Movement: Present. Denies leaking of fluid.   The following portions of the patient's history were reviewed and updated as appropriate: allergies, current medications, past family history, past medical history, past social history, past surgical history and problem list. Problem list updated.  Objective:   Vitals:   07/01/20 1413  BP: 103/69  Pulse: 75  Weight: 192 lb (87.1 kg)    Fetal Status: Fetal Heart Rate (bpm): 140 Fundal Height: 39 cm Movement: Present  Presentation: Vertex  General:  Alert, oriented and cooperative. Patient is in no acute distress.  Skin: Skin is warm and dry. No rash noted.   Cardiovascular: Normal heart rate noted  Respiratory: Normal respiratory effort, no problems with respiration noted  Abdomen: Soft, gravid, appropriate for gestational age.  Pain/Pressure: Present     Pelvic: Cervical exam performed Dilation: 2 Effacement (%): 50 Station: -3  Extremities: Normal range of motion.  Edema: None  Mental Status: Normal mood and affect. Normal behavior. Normal judgment and thought content.   Assessment and Plan:  Pregnancy: G3P2002 at [redacted]w[redacted]d  1. Supervision of other normal pregnancy, antepartum - For waterbirth - Discussed membrane sweeping today. Reviewed cochrane review data on membrane sweeping at 39 wks and then at EDD. Reviewed risk of cramping,  contractions, bleeding and ROM. Answered patient questions and she agreed to proceed with procedure. --Scant bloody show following membrane sweep - Discussed scheduling PDIOL as backup plan. Patient with very consistent history of SOL during 39th week. Will defer scheduling PDIOL until next visit if still pregnant.  2. [redacted] weeks gestation of pregnancy   Term labor symptoms and general obstetric precautions including but not limited to vaginal bleeding, contractions, leaking of fluid and fetal movement were reviewed in detail with the patient. Please refer to After Visit Summary for other counseling recommendations.  Return in about 1 week (around 07/08/2020) for 40 weeks + NST KK or Jamilla if possible .  Future Appointments  Date Time Provider Department Center  07/08/2020  2:15 PM [facility] NST [facility]  07/08/2020  3:15 PM Adam Phenix, MD [facility]    Calvert Cantor, CNM

## 2020-07-02 ENCOUNTER — Encounter (HOSPITAL_COMMUNITY): Payer: Self-pay | Admitting: Obstetrics & Gynecology

## 2020-07-02 ENCOUNTER — Inpatient Hospital Stay (HOSPITAL_COMMUNITY)
Admission: AD | Admit: 2020-07-02 | Discharge: 2020-07-03 | DRG: 807 | Disposition: A | Discharge status: Home / Self Care | Payer: Medicaid Other | Attending: Obstetrics & Gynecology | Admitting: Obstetrics & Gynecology

## 2020-07-02 ENCOUNTER — Inpatient Hospital Stay (HOSPITAL_COMMUNITY): Payer: Medicaid Other | Admitting: Anesthesiology

## 2020-07-02 DIAGNOSIS — Z3480 Encounter for supervision of other normal pregnancy, unspecified trimester: Secondary | ICD-10-CM

## 2020-07-02 DIAGNOSIS — Z3A39 39 weeks gestation of pregnancy: Secondary | ICD-10-CM

## 2020-07-02 DIAGNOSIS — O26893 Other specified pregnancy related conditions, third trimester: Secondary | ICD-10-CM | POA: Diagnosis not present

## 2020-07-02 DIAGNOSIS — Z20822 Contact with and (suspected) exposure to covid-19: Secondary | ICD-10-CM | POA: Diagnosis present

## 2020-07-02 DIAGNOSIS — Z91040 Latex allergy status: Secondary | ICD-10-CM | POA: Diagnosis not present

## 2020-07-02 DIAGNOSIS — O4202 Full-term premature rupture of membranes, onset of labor within 24 hours of rupture: Secondary | ICD-10-CM | POA: Diagnosis not present

## 2020-07-02 LAB — TYPE AND SCREEN
ABO/RH(D): O POS
Antibody Screen: NEGATIVE

## 2020-07-02 LAB — CBC
HCT: 39.2 % (ref 36.0–46.0)
Hemoglobin: 13.6 g/dL (ref 12.0–15.0)
MCH: 31.5 pg (ref 26.0–34.0)
MCHC: 34.7 g/dL (ref 30.0–36.0)
MCV: 90.7 fL (ref 80.0–100.0)
Platelets: 228 10*3/uL (ref 150–400)
RBC: 4.32 MIL/uL (ref 3.87–5.11)
RDW: 13.7 % (ref 11.5–15.5)
WBC: 13.5 10*3/uL — ABNORMAL HIGH (ref 4.0–10.5)
nRBC: 0 % (ref 0.0–0.2)

## 2020-07-02 LAB — RESP PANEL BY RT-PCR (FLU A&B, COVID) ARPGX2
Influenza A by PCR: NEGATIVE
Influenza B by PCR: NEGATIVE
SARS Coronavirus 2 by RT PCR: NEGATIVE

## 2020-07-02 LAB — RPR: RPR Ser Ql: NONREACTIVE

## 2020-07-02 MED ORDER — LACTATED RINGERS IV SOLN
500.0000 mL | Freq: Once | INTRAVENOUS | Status: AC
  Administered 2020-07-02: 500 mL via INTRAVENOUS

## 2020-07-02 MED ORDER — OXYCODONE-ACETAMINOPHEN 5-325 MG PO TABS: 1.0000 | ORAL_TABLET | ORAL | Status: DC | PRN

## 2020-07-02 MED ORDER — DIPHENHYDRAMINE HCL 50 MG/ML IJ SOLN: 12.5000 mg | INTRAMUSCULAR | Status: DC | PRN

## 2020-07-02 MED ORDER — TETANUS-DIPHTH-ACELL PERTUSSIS 5-2.5-18.5 LF-MCG/0.5 IM SUSY: 0.5000 mL | PREFILLED_SYRINGE | Freq: Once | INTRAMUSCULAR | Status: DC

## 2020-07-02 MED ORDER — HYDROXYZINE HCL 50 MG PO TABS: 50.0000 mg | ORAL_TABLET | Freq: Four times a day (QID) | ORAL | Status: DC | PRN

## 2020-07-02 MED ORDER — ONDANSETRON HCL 4 MG/2ML IJ SOLN: 4.0000 mg | Freq: Four times a day (QID) | INTRAMUSCULAR | Status: DC | PRN

## 2020-07-02 MED ORDER — LACTATED RINGERS IV SOLN: 500.0000 mL | INTRAVENOUS | Status: DC | PRN

## 2020-07-02 MED ORDER — OXYCODONE-ACETAMINOPHEN 5-325 MG PO TABS: 2.0000 | ORAL_TABLET | ORAL | Status: DC | PRN

## 2020-07-02 MED ORDER — DIPHENHYDRAMINE HCL 25 MG PO CAPS: 25.0000 mg | ORAL_CAPSULE | Freq: Four times a day (QID) | ORAL | Status: DC | PRN

## 2020-07-02 MED ORDER — FENTANYL CITRATE (PF) 100 MCG/2ML IJ SOLN: 50.0000 ug | INTRAMUSCULAR | Status: DC | PRN

## 2020-07-02 MED ORDER — OXYTOCIN BOLUS FROM INFUSION
333.0000 mL | Freq: Once | INTRAVENOUS | Status: AC
  Administered 2020-07-02: 333 mL via INTRAVENOUS

## 2020-07-02 MED ORDER — BENZOCAINE-MENTHOL 20-0.5 % EX AERO: 1.0000 "application " | INHALATION_SPRAY | CUTANEOUS | Status: DC | PRN

## 2020-07-02 MED ORDER — DIBUCAINE (PERIANAL) 1 % EX OINT: 1.0000 "application " | TOPICAL_OINTMENT | CUTANEOUS | Status: DC | PRN

## 2020-07-02 MED ORDER — ACETAMINOPHEN 325 MG PO TABS: 650.0000 mg | ORAL_TABLET | ORAL | Status: DC | PRN

## 2020-07-02 MED ORDER — FLEET ENEMA 7-19 GM/118ML RE ENEM: 1.0000 | ENEMA | RECTAL | Status: DC | PRN

## 2020-07-02 MED ORDER — IBUPROFEN 600 MG PO TABS
600.0000 mg | ORAL_TABLET | Freq: Four times a day (QID) | ORAL | Status: DC
  Administered 2020-07-02 – 2020-07-03 (×5): 600 mg via ORAL
  Filled 2020-07-02 (×5): qty 1

## 2020-07-02 MED ORDER — ONDANSETRON HCL 4 MG PO TABS
4.0000 mg | ORAL_TABLET | ORAL | Status: DC | PRN
Start: 2020-07-02 — End: 2020-07-03

## 2020-07-02 MED ORDER — WITCH HAZEL-GLYCERIN EX PADS: 1.0000 "application " | MEDICATED_PAD | CUTANEOUS | Status: DC | PRN

## 2020-07-02 MED ORDER — SENNOSIDES-DOCUSATE SODIUM 8.6-50 MG PO TABS
2.0000 | ORAL_TABLET | ORAL | Status: DC
  Filled 2020-07-02: qty 2

## 2020-07-02 MED ORDER — PHENYLEPHRINE 40 MCG/ML (10ML) SYRINGE FOR IV PUSH (FOR BLOOD PRESSURE SUPPORT): 80.0000 ug | PREFILLED_SYRINGE | INTRAVENOUS | Status: DC | PRN

## 2020-07-02 MED ORDER — LIDOCAINE HCL (PF) 1 % IJ SOLN: 30.0000 mL | INTRAMUSCULAR | Status: DC | PRN

## 2020-07-02 MED ORDER — SOD CITRATE-CITRIC ACID 500-334 MG/5ML PO SOLN: 30.0000 mL | ORAL | Status: DC | PRN

## 2020-07-02 MED ORDER — ONDANSETRON HCL 4 MG/2ML IJ SOLN: 4.0000 mg | INTRAMUSCULAR | Status: DC | PRN

## 2020-07-02 MED ORDER — LIDOCAINE HCL (PF) 1 % IJ SOLN
INTRAMUSCULAR | Status: DC | PRN
  Administered 2020-07-02: 7 mL via EPIDURAL
  Administered 2020-07-02: 3 mL via EPIDURAL

## 2020-07-02 MED ORDER — COCONUT OIL OIL: 1.0000 "application " | TOPICAL_OIL | Status: DC | PRN

## 2020-07-02 MED ORDER — LACTATED RINGERS IV SOLN: INTRAVENOUS | Status: DC

## 2020-07-02 MED ORDER — FENTANYL-BUPIVACAINE-NACL 0.5-0.125-0.9 MG/250ML-% EP SOLN
EPIDURAL | Status: AC
  Filled 2020-07-02: qty 250

## 2020-07-02 MED ORDER — EPHEDRINE 5 MG/ML INJ: 10.0000 mg | INTRAVENOUS | Status: DC | PRN

## 2020-07-02 MED ORDER — OXYTOCIN-SODIUM CHLORIDE 30-0.9 UT/500ML-% IV SOLN
2.5000 [IU]/h | INTRAVENOUS | Status: DC
  Filled 2020-07-02: qty 500

## 2020-07-02 MED ORDER — DOCUSATE SODIUM 100 MG PO CAPS: 100.0000 mg | ORAL_CAPSULE | Freq: Two times a day (BID) | ORAL | Status: DC

## 2020-07-02 MED ORDER — SIMETHICONE 80 MG PO CHEW
80.0000 mg | CHEWABLE_TABLET | ORAL | Status: DC | PRN
  Administered 2020-07-03: 80 mg via ORAL
  Filled 2020-07-02: qty 1

## 2020-07-02 MED ORDER — PRENATAL MULTIVITAMIN CH
1.0000 | ORAL_TABLET | Freq: Every day | ORAL | Status: DC
  Administered 2020-07-02: 1 via ORAL
  Filled 2020-07-02: qty 1

## 2020-07-02 MED ORDER — FENTANYL-BUPIVACAINE-NACL 0.5-0.125-0.9 MG/250ML-% EP SOLN
12.0000 mL/h | EPIDURAL | Status: DC | PRN
Start: 2020-07-02 — End: 2020-07-02
  Administered 2020-07-02: 12 mL/h via EPIDURAL

## 2020-07-02 NOTE — Progress Notes (Signed)
To BS via stretcher 

## 2020-07-02 NOTE — Plan of Care (Signed)
  Problem: Education: Goal: Knowledge of General Education information will improve Description: Including pain rating scale, medication(s)/side effects and non-pharmacologic comfort measures 07/02/2020 0816 by Renella Cunas, RN Outcome: Completed/Met 07/02/2020 0816 by Renella Cunas, RN Outcome: Progressing   Problem: Health Behavior/Discharge Planning: Goal: Ability to manage health-related needs will improve 07/02/2020 0816 by Renella Cunas, RN Outcome: Completed/Met 07/02/2020 0816 by Renella Cunas, RN Outcome: Progressing   Problem: Clinical Measurements: Goal: Ability to maintain clinical measurements within normal limits will improve 07/02/2020 0816 by Renella Cunas, RN Outcome: Completed/Met 07/02/2020 0816 by Renella Cunas, RN Outcome: Progressing Goal: Will remain free from infection 07/02/2020 0816 by Renella Cunas, RN Outcome: Completed/Met 07/02/2020 0816 by Renella Cunas, RN Outcome: Progressing Goal: Diagnostic test results will improve 07/02/2020 0816 by Renella Cunas, RN Outcome: Completed/Met 07/02/2020 0816 by Renella Cunas, RN Outcome: Progressing Goal: Respiratory complications will improve 07/02/2020 0816 by Renella Cunas, RN Outcome: Completed/Met 07/02/2020 0816 by Renella Cunas, RN Outcome: Progressing Goal: Cardiovascular complication will be avoided 07/02/2020 0816 by Renella Cunas, RN Outcome: Completed/Met 07/02/2020 0816 by Renella Cunas, RN Outcome: Progressing   Problem: Activity: Goal: Risk for activity intolerance will decrease 07/02/2020 0816 by Renella Cunas, RN Outcome: Completed/Met 07/02/2020 0816 by Renella Cunas, RN Outcome: Progressing   Problem: Nutrition: Goal: Adequate nutrition will be maintained 07/02/2020 0816 by Renella Cunas, RN Outcome: Completed/Met 07/02/2020 0816 by Renella Cunas, RN Outcome: Progressing   Problem: Coping: Goal: Level of anxiety will decrease 07/02/2020 0816 by Renella Cunas, RN Outcome: Completed/Met 07/02/2020 0816 by Renella Cunas, RN Outcome: Progressing   Problem: Elimination: Goal: Will not experience complications related to bowel motility 07/02/2020 0816 by Renella Cunas, RN Outcome: Completed/Met 07/02/2020 0816 by Renella Cunas, RN Outcome: Progressing Goal: Will not experience complications related to urinary retention 07/02/2020 0816 by Renella Cunas, RN Outcome: Completed/Met 07/02/2020 0816 by Renella Cunas, RN Outcome: Progressing   Problem: Pain Managment: Goal: General experience of comfort will improve 07/02/2020 0816 by Renella Cunas, RN Outcome: Completed/Met 07/02/2020 0816 by Renella Cunas, RN Outcome: Progressing   Problem: Safety: Goal: Ability to remain free from injury will improve 07/02/2020 0816 by Renella Cunas, RN Outcome: Completed/Met 07/02/2020 0816 by Renella Cunas, RN Outcome: Progressing   Problem: Skin Integrity: Goal: Risk for impaired skin integrity will decrease 07/02/2020 0816 by Renella Cunas, RN Outcome: Completed/Met 07/02/2020 0816 by Renella Cunas, RN Outcome: Progressing   Problem: Education: Goal: Knowledge of Childbirth will improve Outcome: Completed/Met Goal: Ability to make informed decisions regarding treatment and plan of care will improve Outcome: Completed/Met Goal: Ability to state and carry out methods to decrease the pain will improve Outcome: Completed/Met Goal: Individualized Educational Video(s) Outcome: Completed/Met   Problem: Coping: Goal: Ability to verbalize concerns and feelings about labor and delivery will improve Outcome: Completed/Met   Problem: Life Cycle: Goal: Ability to make normal progression through stages of labor will improve Outcome: Completed/Met Goal: Ability to effectively push during vaginal delivery will improve Outcome: Completed/Met   Problem: Role Relationship: Goal: Will demonstrate positive interactions with the  child Outcome: Completed/Met   Problem: Safety: Goal: Risk of complications during labor and delivery will decrease Outcome: Completed/Met   Problem: Pain Management: Goal: Relief or control of pain from uterine contractions will improve Outcome: Completed/Met

## 2020-07-02 NOTE — Discharge Summary (Signed)
Postpartum Discharge Summary     Patient Name: Andrea Foster DOB: 1990/05/04 MRN: [id number]  Date of admission: 07/02/2020 Delivery date:07/02/2020  Delivering provider: Janet Berlin  Date of discharge: 07/03/2020  Admitting diagnosis: Indication for care in labor and delivery, antepartum [O75.9] Intrauterine pregnancy: [redacted]w[redacted]d     Secondary diagnosis:  Active Problems:   Indication for care in labor and delivery, antepartum   Vaginal delivery  Additional problems: none    Discharge diagnosis: Term Pregnancy Delivered                                              Post partum procedures:n/a Augmentation: N/A Complications: None  Hospital course: Onset of Labor With Vaginal Delivery      30 y.o. yo G3P2002 at [redacted]w[redacted]d was admitted in Active Labor on 07/02/2020. Patient had an uncomplicated labor course as follows:  Membrane Rupture Time/Date: 7:12 AM ,07/02/2020   Delivery Method:Vaginal, Spontaneous  Episiotomy: None  Lacerations:  None  Patient had an uncomplicated postpartum course.  She is ambulating, tolerating a regular diet, passing flatus, and urinating well. Patient is discharged home in stable condition on 07/03/20.  Newborn Data: Birth date:07/02/2020  Birth time:7:40 AM  Gender:Female  Living status:Living  Apgars:8 ,9  Weight:3765 g   Magnesium Sulfate received: No BMZ received: No Rhophylac:N/A MMR:N/A T-DaP:Given prenatally Flu: No Transfusion:No  Physical exam  Vitals:   07/02/20 1830 07/02/20 2240 07/03/20 0238 07/03/20 0549  BP: 108/75 (!) 129/96 122/78 111/74  Pulse: 72 (!) 57 (!) 59 63  Resp: $Remo'16 18 18 18  'yiIrF$ Temp: 98.4 F (36.9 C) 97.8 F (36.6 C) 97.7 F (36.5 C) 98.3 F (36.8 C)  TempSrc: Oral Oral Oral Oral  SpO2: 98% 100% 100% 100%  Weight:      Height:       General: alert, cooperative and no distress Lochia: appropriate Uterine Fundus: firm Incision: N/A DVT Evaluation: No evidence of DVT seen on physical exam. Labs: Lab Results   Component Value Date   WBC 13.5 (H) 07/02/2020   HGB 13.6 07/02/2020   HCT 39.2 07/02/2020   MCV 90.7 07/02/2020   PLT 228 07/02/2020   CMP Latest Ref Rng & Units 12/09/2017  Glucose 70 - 99 mg/dL 114(H)  BUN 6 - 20 mg/dL 9  Creatinine 0.44 - 1.00 mg/dL 0.65  Sodium 135 - 145 mmol/L 133(L)  Potassium 3.5 - 5.1 mmol/L 3.8  Chloride 98 - 111 mmol/L 104  CO2 22 - 32 mmol/L 21(L)  Calcium 8.9 - 10.3 mg/dL 9.0  Total Protein 6.5 - 8.1 g/dL 6.8  Total Bilirubin 0.3 - 1.2 mg/dL 0.7  Alkaline Phos 38 - 126 U/L 65  AST 15 - 41 U/L 16  ALT 0 - 44 U/L 14   Edinburgh Score: Edinburgh Postnatal Depression Scale Screening Tool 10/17/2018  I have been able to laugh and see the funny side of things. 0  I have looked forward with enjoyment to things. 0  I have blamed myself unnecessarily when things went wrong. 0  I have been anxious or worried for no good reason. 0  I have felt scared or panicky for no good reason. 0  Things have been getting on top of me. 0  I have been so unhappy that I have had difficulty sleeping. 0  I have felt sad or miserable. 0  I have been so unhappy that I have been crying. 0  The thought of harming myself has occurred to me. 0  Edinburgh Postnatal Depression Scale Total 0     After visit meds:  Allergies as of 07/03/2020      Reactions   Latex Hives   Other Anaphylaxis   walnuts      Medication List    STOP taking these medications   Blood Pressure Monitoring Devi     TAKE these medications   acetaminophen 500 MG tablet Commonly known as: TYLENOL Take 2 tablets (1,000 mg total) by mouth every 8 (eight) hours as needed (for pain scale < 4).   docusate sodium 100 MG capsule Commonly known as: COLACE Take 1 capsule (100 mg total) by mouth 2 (two) times daily.   ibuprofen 600 MG tablet Commonly known as: ADVIL Take 1 tablet (600 mg total) by mouth every 6 (six) hours.   Iron (Ferrous Sulfate) 325 (65 Fe) MG Tabs Take 1 tablet by mouth  daily. What changed: when to take this   Prenatal 27-1 MG Tabs Take 1 tablet by mouth daily.        Discharge home in stable condition Infant Feeding: Bottle Infant Disposition:home with mother Discharge instruction: per After Visit Summary and Postpartum booklet. Activity: Advance as tolerated. Pelvic rest for 6 weeks.  Diet: routine diet Future Appointments: Future Appointments  Date Time Provider [place]  07/08/2020  2:15 PM [facility]  07/08/2020  3:15 PM Woodroe Mode, MD [facility]   Follow up Visit:   Please schedule this patient for a virtual postpartum visit in 6 weeks with the following provider: Any provider. Additional Postpartum F/U:none  Low risk pregnancy complicated by: uncomplicated Delivery mode:  Vaginal, Spontaneous  Anticipated Birth Control:  vasectomy   07/03/2020 Janet Berlin, MD

## 2020-07-02 NOTE — H&P (Signed)
Andrea Foster is a 30 y.o. female G45P2002 with IUP at 106w4d by LMP presenting for labor. Started a few hours ago and picked up in intensity. She reports positive fetal movement. She denies leakage of fluid or vaginal bleeding.  Prenatal History/Complications: PNC at [facility] Pregnancy complications: None   - Past Medical History: Past Medical History:  Diagnosis Date   Anemia    Asthma    seasonal as a child   Depression    Gonorrhea    Hx of chlamydia infection 2013    Past Surgical History: Past Surgical History:  Procedure Laterality Date   NO PAST SURGERIES      Obstetrical History: OB History    Gravida  3   Para  2   Term  2   Preterm  0   AB  0   Living  2     SAB  0   IAB  0   Ectopic  0   Multiple  0   Live Births  2            Social History: Social History   Socioeconomic History   Marital status: Single    Spouse name: Not on file   Number of children: Not on file   Years of education: Not on file   Highest education level: Not on file  Occupational History   Not on file  Tobacco Use   Smoking status: Never Smoker   Smokeless tobacco: Never Used  Vaping Use   Vaping Use: Never used  Substance and Sexual Activity   Alcohol use: No    Alcohol/week: 1.0 standard drink    Types: 1 Shots of liquor per week    Comment: rare, spicial occasions, not while preg   Drug use: No   Sexual activity: Yes    Birth control/protection: None  Other Topics Concern   Not on file  Social History Narrative   Not on file   Social Determinants of Health   Financial Resource Strain: Not on file  Food Insecurity: No Food Insecurity   Worried About Running Out of Food in the Last Year: Never true   Ran Out of Food in the Last Year: Never true  Transportation Needs: No Transportation Needs   Lack of Transportation (Medical): No   Lack of Transportation (Non-Medical): No  Physical Activity: Not on file  Stress: Not  on file  Social Connections: Not on file    Family History: Family History  Problem Relation Age of Onset   Cancer Maternal Grandfather        ? agent orange   Asthma Neg Hx    Diabetes Neg Hx    Heart disease Neg Hx    Hypertension Neg Hx    Stroke Neg Hx     Allergies: Allergies  Allergen Reactions   Latex Hives   Other Anaphylaxis    walnuts    Medications Prior to Admission  Medication Sig Dispense Refill Last Dose   Blood Pressure Monitoring DEVI 1 Device by Does not apply route once a week. 1 Device 0    Iron, Ferrous Sulfate, 325 (65 Fe) MG TABS Take 1 tablet by mouth daily. (Patient taking differently: Take 1 tablet by mouth every other day.) 30 tablet 3    Prenatal 27-1 MG TABS Take 1 tablet by mouth daily. 30 tablet 5     Review of Systems   Constitutional: Negative for fever and chills Eyes: Negative for visual disturbances  Respiratory: Negative for shortness of breath, dyspnea Cardiovascular: Negative for chest pain or palpitations  Gastrointestinal: Negative for vomiting, diarrhea and constipation.  POSITIVE for abdominal pain (contractions) Genitourinary: Negative for dysuria and urgency Musculoskeletal: Negative for back pain, joint pain, myalgias  Neurological: Negative for dizziness and headaches  Last menstrual period 09/29/2019, unknown if currently breastfeeding. General appearance: alert, cooperative and appears stated age Lungs: normal respiratory effort Heart: regular rate and rhythm Abdomen: soft, non-tender; bowel sounds normal Extremities: Homans sign is negative, no sign of DVT DTR's 2+ Presentation: cephalic Fetal monitoring baseline 130, mod variability, pos accel, no decels  Uterine activity q4-5 min  Dilation: 7.5 Effacement (%): 80 Station: -1 Exam by:: Nancee Liter RN   Prenatal labs: ABO, Rh: O/Positive/-- (08/12 1056) Antibody: Negative (08/12 1056) Rubella: 1.81 (08/12 1056) RPR: Non Reactive (08/12 1056)   HBsAg: Negative (08/12 1056)  HIV: Non Reactive (08/12 1056)  GBS: Negative/-- (02/10 1426)   Nursing Staff Provider  Office Location [facility] Dating  LMP = anatomy scan  Language  English Anatomy US  ECF, Otherwise Normal. Repeat Scheduled.   Flu Vaccine  Declined 04/16/20 Genetic Screen  NIPS:  Low risk female AFP: not done  First Screen:  Quad:    TDaP vaccine  04/16/20 Hgb A1C or  GTT Early HgbA1c is 5.7 in early preg Third trimester    Glucose, Fasting 65 - 91 mg/dL 85   Glucose, 1 hour 65 - 179 mg/dL 96   Glucose, 2 hour 65 - 152 mg/dL 00BBC      COVID vaccine    LAB RESULTS   Rhogam   NA Blood Type O/Positive/-- (08/12 1056)   Feeding Plan Bottle Antibody Negative (08/12 1056)  Contraception Boyfriend-Vasectomy Rubella 1.81 (08/12 1056)  Circumcision Yes RPR Non Reactive (08/12 1056)   Pediatrician  Dr.Rubin HBsAg Negative (08/12 1056)   Support Person Andrea Foster(FOB) HCVAb Negative**Positive  Prenatal Classes  HIV Non Reactive (08/12 1056)     BTL Consent NA GBS  (For PCN allergy, check sensitivities) GBS neg  VBAC Consent NA Pap  normal 08/21    Hgb Electro   screen neg  BP Cuff Has BP cuff CF Screen neg    SMA  screen neg    Waterbirth  [ ]  Class [ ]  Consent [ ]  CNM visit    Induction  [ ]  Orders Entered [ ] Foley Y/N    Prenatal Transfer Tool  Maternal Diabetes: No Genetic Screening: Normal Maternal Ultrasounds/Referrals: Normal Fetal Ultrasounds or other Referrals:  None Maternal Substance Abuse:  No Significant Maternal Medications:  None Significant Maternal Lab Results: Group B Strep negative  No results found for this or any previous visit (from the past 24 hour(s)).  Assessment: Andrea Foster is a 30 y.o. [phone] with an IUP at [redacted]w[redacted]d presenting for spontaneous labor.   Plan: #Labor: expectant management. Had desired waterbirth and completed course, etc but now declines and wants epidural.  #Pain:  Desires epidural  #FWB Cat 1 #ID: GBS: neg  #MOF:   breast #MOC: vasectomy #Circ: Yes     , MD OB Family Medicine Fellow, [facility] for [facility], [facility]

## 2020-07-02 NOTE — MAU Note (Signed)
One attempt for iv start without success. To BS via stretcher to 216.

## 2020-07-02 NOTE — Lactation Note (Signed)
This note was copied from a baby's chart. Lactation Consultation Note  Patient Name: Andrea Foster [id number] Date: 07/02/2020   Age:28 hours  I spoke with the L&D nurse. Mom declined a visit from an [facility]; Mom plans to FO feed only.   Lurline Hare [facility] 07/02/2020, 9:10 AM

## 2020-07-02 NOTE — Anesthesia Preprocedure Evaluation (Signed)

## 2020-07-02 NOTE — Anesthesia Postprocedure Evaluation (Signed)
Anesthesia Post Note  Patient: Andrea Foster  Procedure(s) Performed: AN AD HOC LABOR EPIDURAL     Patient location during evaluation: Mother Baby Anesthesia Type: Epidural Level of consciousness: awake and alert and oriented Pain management: satisfactory to patient Vital Signs Assessment: post-procedure vital signs reviewed and stable Respiratory status: respiratory function stable Cardiovascular status: stable Postop Assessment: no headache, no backache, epidural receding, patient able to bend at knees, no signs of nausea or vomiting, adequate PO intake and able to ambulate Anesthetic complications: no   No complications documented.  Last Vitals:  Vitals:   07/02/20 0933 07/02/20 1041  BP: 111/68 116/78  Pulse: 64 64  Resp: 17 16  Temp: 36.9 C 36.8 C  SpO2: 100% 100%    Last Pain:  Vitals:   07/02/20 1041  TempSrc: Oral  PainSc:    Pain Goal: Patients Stated Pain Goal: 0 (07/02/20 0412)                 Karleen Dolphin

## 2020-07-02 NOTE — Anesthesia Procedure Notes (Signed)
Epidural Patient location during procedure: OB Start time: 07/02/2020 4:55 AM End time: 07/02/2020 5:06 AM  Staffing Anesthesiologist: Lucretia Kern, MD Performed: anesthesiologist   Preanesthetic Checklist Completed: patient identified, IV checked, risks and benefits discussed, monitors and equipment checked, pre-op evaluation and timeout performed  Epidural Patient position: sitting Prep: DuraPrep Patient monitoring: heart rate, continuous pulse ox and blood pressure Approach: midline Location: L3-L4 Injection technique: LOR air  Needle:  Needle type: Tuohy  Needle gauge: 17 G Needle length: 9 cm Needle insertion depth: 6 cm Catheter type: closed end flexible Catheter size: 19 Gauge Catheter at skin depth: 11 cm Test dose: negative  Assessment Events: blood not aspirated, injection not painful, no injection resistance, no paresthesia and negative IV test  Additional Notes Reason for block:procedure for pain

## 2020-07-02 NOTE — Plan of Care (Signed)

## 2020-07-02 NOTE — MAU Note (Signed)
Ctxs since 2200. Bloody show

## 2020-07-03 MED ORDER — IBUPROFEN 600 MG PO TABS: 600.0000 mg | ORAL_TABLET | Freq: Four times a day (QID) | ORAL | 0 refills | Status: DC

## 2020-07-03 MED ORDER — DOCUSATE SODIUM 100 MG PO CAPS: 100.0000 mg | ORAL_CAPSULE | Freq: Two times a day (BID) | ORAL | 0 refills | Status: DC

## 2020-07-03 MED ORDER — ACETAMINOPHEN 500 MG PO TABS: 1000.0000 mg | ORAL_TABLET | Freq: Three times a day (TID) | ORAL | 0 refills | Status: DC | PRN

## 2020-07-03 NOTE — Progress Notes (Signed)
CSW received consult for hx of Depression.  CSW met with MOB to offer support and complete assessment.    CSW met with MOB at bedside and introduced self, FOB present. MOB granted CSW verbal permission to speak in front of FOB about anything. CSW explained reason for consult. MOB was welcoming, pleasant, open and remained engaged during assessment. CSW and MOB discussed MOB's mental health history. MOB reported that she is experienced depression during the beginning of pregnancy. MOB reported that she experienced being sad, crying, isolating and a lack of motivation. MOB reported that her hormones were everywhere, noting that she has been pregnant for three years. MOB reported that she was connected with a virtual counselor through her OB provider and spoke with her twice. MOB reported that it was helpful and that her depressive symptoms subsided. MOB endorsed a history of postpartum depression after having her son. MOB reported that her postpartum depression symptoms were on and off for a year. MOB reported that her postpartum depression went away on it's own with no medication nor therapy. CSW inquired about how MOB was feeling emotionally after giving birth, MOB reported that she was feeling good. MOB presented calm and did not demonstrate any acute mental health signs/symptoms. CSW assessed for safety, MOB denied SI and HI. CSW did not assess for domestic violence as FOB was present. CSW inquired about MOB's support system, MOB reported that FOB, her mom and best friend were supports. MOB reported that they have all items needed to care for infant including a car seat and crib.   CSW provided education regarding the baby blues period vs. perinatal mood disorders, discussed treatment and gave resources for mental health follow up if concerns arise.  CSW recommends self-evaluation during the postpartum time period using the New Mom Checklist from Postpartum Progress and encouraged MOB to contact a medical  professional if symptoms are noted at any time.    CSW provided review of Sudden Infant Death Syndrome (SIDS) precautions.    CSW identifies no further need for intervention and no barriers to discharge at this time.  Kimberly Long, LCSW Clinical Social Worker [facility] Cell#: [phone]  

## 2020-07-03 NOTE — Progress Notes (Signed)
OB Note No circ supplies available until the evening and patient desires d/c to home today.  Request sent to [facility] for circ for baby this upcoming week  Cornelia Copa MD Attending [facility] (Faculty Practice) 07/03/2020 Time: 1300

## 2020-07-08 ENCOUNTER — Other Ambulatory Visit: Payer: Medicaid Other

## 2020-07-08 ENCOUNTER — Encounter: Payer: Medicaid Other | Admitting: Obstetrics & Gynecology

## 2020-07-28 ENCOUNTER — Encounter: Payer: Self-pay | Admitting: *Deleted

## 2020-07-30 DIAGNOSIS — Z419 Encounter for procedure for purposes other than remedying health state, unspecified: Secondary | ICD-10-CM | POA: Diagnosis not present

## 2020-08-12 ENCOUNTER — Ambulatory Visit: Payer: Medicaid Other | Admitting: Student

## 2020-08-29 DIAGNOSIS — Z419 Encounter for procedure for purposes other than remedying health state, unspecified: Secondary | ICD-10-CM | POA: Diagnosis not present

## 2020-09-14 ENCOUNTER — Telehealth: Payer: Self-pay

## 2020-09-14 NOTE — Telephone Encounter (Signed)
Sent message via MyChart on directions how to navigate on [email or web address] to get results.

## 2020-09-29 DIAGNOSIS — Z419 Encounter for procedure for purposes other than remedying health state, unspecified: Secondary | ICD-10-CM | POA: Diagnosis not present

## 2020-10-29 DIAGNOSIS — Z419 Encounter for procedure for purposes other than remedying health state, unspecified: Secondary | ICD-10-CM | POA: Diagnosis not present

## 2020-11-29 DIAGNOSIS — Z419 Encounter for procedure for purposes other than remedying health state, unspecified: Secondary | ICD-10-CM | POA: Diagnosis not present

## 2020-12-30 DIAGNOSIS — Z419 Encounter for procedure for purposes other than remedying health state, unspecified: Secondary | ICD-10-CM | POA: Diagnosis not present

## 2021-01-06 DIAGNOSIS — N898 Other specified noninflammatory disorders of vagina: Secondary | ICD-10-CM | POA: Diagnosis not present

## 2021-01-06 DIAGNOSIS — Z0000 Encounter for general adult medical examination without abnormal findings: Secondary | ICD-10-CM | POA: Diagnosis not present

## 2021-01-06 DIAGNOSIS — Z113 Encounter for screening for infections with a predominantly sexual mode of transmission: Secondary | ICD-10-CM | POA: Diagnosis not present

## 2021-01-06 DIAGNOSIS — Z124 Encounter for screening for malignant neoplasm of cervix: Secondary | ICD-10-CM | POA: Diagnosis not present

## 2021-01-06 DIAGNOSIS — Z3040 Encounter for surveillance of contraceptives, unspecified: Secondary | ICD-10-CM | POA: Diagnosis not present

## 2021-01-06 DIAGNOSIS — Z6832 Body mass index (BMI) 32.0-32.9, adult: Secondary | ICD-10-CM | POA: Diagnosis not present

## 2021-01-06 DIAGNOSIS — Z01419 Encounter for gynecological examination (general) (routine) without abnormal findings: Secondary | ICD-10-CM | POA: Diagnosis not present

## 2021-01-06 DIAGNOSIS — Z0001 Encounter for general adult medical examination with abnormal findings: Secondary | ICD-10-CM | POA: Diagnosis not present

## 2021-01-14 DIAGNOSIS — B009 Herpesviral infection, unspecified: Secondary | ICD-10-CM | POA: Diagnosis not present

## 2021-01-29 DIAGNOSIS — Z419 Encounter for procedure for purposes other than remedying health state, unspecified: Secondary | ICD-10-CM | POA: Diagnosis not present

## 2021-02-24 DIAGNOSIS — Z113 Encounter for screening for infections with a predominantly sexual mode of transmission: Secondary | ICD-10-CM | POA: Diagnosis not present

## 2021-03-01 DIAGNOSIS — Z419 Encounter for procedure for purposes other than remedying health state, unspecified: Secondary | ICD-10-CM | POA: Diagnosis not present

## 2021-03-31 DIAGNOSIS — Z419 Encounter for procedure for purposes other than remedying health state, unspecified: Secondary | ICD-10-CM | POA: Diagnosis not present

## 2021-05-01 DIAGNOSIS — Z419 Encounter for procedure for purposes other than remedying health state, unspecified: Secondary | ICD-10-CM | POA: Diagnosis not present

## 2021-06-01 DIAGNOSIS — Z419 Encounter for procedure for purposes other than remedying health state, unspecified: Secondary | ICD-10-CM | POA: Diagnosis not present

## 2021-06-27 ENCOUNTER — Other Ambulatory Visit: Payer: Self-pay

## 2021-06-27 ENCOUNTER — Ambulatory Visit (HOSPITAL_COMMUNITY)
Admission: EM | Admit: 2021-06-27 | Discharge: 2021-06-27 | Disposition: A | Discharge status: Home / Self Care | Payer: Medicaid Other | Attending: Sports Medicine | Admitting: Sports Medicine

## 2021-06-27 ENCOUNTER — Encounter (HOSPITAL_COMMUNITY): Payer: Self-pay | Admitting: Emergency Medicine

## 2021-06-27 DIAGNOSIS — N61 Mastitis without abscess: Secondary | ICD-10-CM | POA: Diagnosis not present

## 2021-06-27 DIAGNOSIS — J329 Chronic sinusitis, unspecified: Secondary | ICD-10-CM | POA: Diagnosis not present

## 2021-06-27 DIAGNOSIS — G44209 Tension-type headache, unspecified, not intractable: Secondary | ICD-10-CM | POA: Diagnosis not present

## 2021-06-27 MED ORDER — AMOXICILLIN 875 MG PO TABS: 875.0000 mg | ORAL_TABLET | Freq: Two times a day (BID) | ORAL | 0 refills | Status: AC

## 2021-06-27 NOTE — Discharge Instructions (Addendum)
Complete Amoxicillin antibiotic --> take 1 tablet twice a day with food for the next 7 days  May use ibuprofen or Aleve for headache-like pain  If the headache persists greater than the next 7-10 days, would recommend following up with your PCP  If the breastpain progresses or does not get better after completion of the antibiotic, would recommend following up with your PCP or OB/GYN

## 2021-06-27 NOTE — ED Provider Notes (Signed)
[facility]    CSN: [id number] Arrival date & time: 06/27/21  9417      History   Chief Complaint Chief Complaint  Patient presents with   Headache    HPI Andrea Foster is a 31 y.o. female here for headache, sinus pressure, and left nipple pain.   Headache Associated symptoms: sore throat   Associated symptoms: no cough, no diarrhea, no dizziness, no fever, no vomiting and no weakness    Headache on and off x 1 week Front of the head, but also on top of head. Hurts to put hair in ponytail Taking ibuprofen - doesn't help much Denies any nausea, vomiting, or diarrhea Does use contacts/glassess - sometimes blurry when driving at night No double vision, no photophobia   One week ago, she had a viral gastroenteritis  Currently,  No runny nose, no congestion Some scratchy throat No fever/chills.  Left breast pain - x 1 day.  She does have a child under 1, but she is not currently breast-feeding.  Notices some redness and tenderness over the nipple area.  Denies any lesion or swelling.  Has not had this before. She did have a menstrual period one week. Was heavier than usual for her. Lasted about 5 days. This is when her headache came on when her menses started.   Past Medical History:  Diagnosis Date   Anemia    Asthma    seasonal as a child   Depression    Gonorrhea    Hx of chlamydia infection 2013    Patient Active Problem List   Diagnosis Date Noted   Indication for care in labor and delivery, antepartum 07/02/2020   Vaginal delivery 07/02/2020   Fetal echogenic intracardiac focus on prenatal ultrasound 02/15/2020   Anemia in pregnancy 12/15/2019   Supervision of other normal pregnancy, antepartum 12/11/2019   Normal labor 07/23/2018   History of gestational hypertension 01/30/2018   Short interval between pregnancies affecting pregnancy in first trimester, antepartum 01/02/2018    Past Surgical History:  Procedure Laterality Date   NO  PAST SURGERIES      OB History     Gravida  3   Para  3   Term  3   Preterm  0   AB  0   Living  3      SAB  0   IAB  0   Ectopic  0   Multiple  0   Live Births  3            Home Medications    Prior to Admission medications   Medication Sig Start Date End Date Taking? Authorizing Provider  amoxicillin (AMOXIL) 875 MG tablet Take 1 tablet (875 mg total) by mouth 2 (two) times daily for 7 days. 06/27/21 07/04/21 Yes Madelyn Brunner, DO  acetaminophen (TYLENOL) 500 MG tablet Take 2 tablets (1,000 mg total) by mouth every 8 (eight) hours as needed (for pain scale < 4). 07/03/20   Marsala, Arlana Pouch, MD  docusate sodium (COLACE) 100 MG capsule Take 1 capsule (100 mg total) by mouth 2 (two) times daily. Patient not taking: Reported on 06/27/2021 07/03/20   Gita Kudo, MD  ibuprofen (ADVIL) 600 MG tablet Take 1 tablet (600 mg total) by mouth every 6 (six) hours. 07/03/20   Gita Kudo, MD  Iron, Ferrous Sulfate, 325 (65 Fe) MG TABS Take 1 tablet by mouth daily. Patient not taking: Reported on 06/27/2021 02/12/20   Luna Kitchens  Karin Golden, CNM  Prenatal 27-1 MG TABS Take 1 tablet by mouth daily. Patient not taking: Reported on 06/27/2021 02/12/20   Marylene Land, CNM    Family History Family History  Problem Relation Age of Onset   Cancer Maternal Grandfather        ? agent orange   Asthma Neg Hx    Diabetes Neg Hx    Heart disease Neg Hx    Hypertension Neg Hx    Stroke Neg Hx     Social History Social History   Tobacco Use   Smoking status: Never   Smokeless tobacco: Never  Vaping Use   Vaping Use: Never used  Substance Use Topics   Alcohol use: No    Alcohol/week: 1.0 standard drink    Types: 1 Shots of liquor per week    Comment: rare, spicial occasions, not while preg   Drug use: No     Allergies   Latex and Other   Review of Systems Review of Systems  Constitutional:  Negative for chills and fever.  HENT:  Positive for  sinus pain and sore throat.   Respiratory:  Negative for cough and shortness of breath.   Cardiovascular:  Negative for chest pain.  Gastrointestinal:  Negative for diarrhea and vomiting.  Genitourinary:  Positive for menstrual problem (heavier bleeding during last cycle, x 5 days).  Skin:  Positive for color change.       + redness and pain over left nipple  Neurological:  Positive for headaches. Negative for dizziness and weakness.    Physical Exam Triage Vital Signs ED Triage Vitals  Enc Vitals Group     BP 06/27/21 1053 120/79     Pulse Rate 06/27/21 1053 72     Resp 06/27/21 1053 18     Temp 06/27/21 1053 97.7 F (36.5 C)     Temp Source 06/27/21 1053 Oral     SpO2 06/27/21 1053 100 %     Weight --      Height --      Head Circumference --      Peak Flow --      Pain Score 06/27/21 1050 6     Pain Loc --      Pain Edu? --      Excl. in GC? --    No data found.  Updated Vital Signs BP 120/79 (BP Location: Right Arm)    Pulse 72    Temp 97.7 F (36.5 C) (Oral)    Resp 18    LMP 06/23/2021    SpO2 100%   Physical Exam Exam conducted with a chaperone present (RN: Selena Batten).  Constitutional:      General: She is not in acute distress.    Appearance: She is well-developed. She is not toxic-appearing.  HENT:     Head: Normocephalic and atraumatic.     Comments: + some frontal sinus TTP    Right Ear: Ear canal and external ear normal.     Left Ear: Ear canal and external ear normal.     Nose: Nose normal.     Mouth/Throat:     Mouth: Mucous membranes are moist.     Pharynx: No oropharyngeal exudate or posterior oropharyngeal erythema.  Eyes:     Extraocular Movements: Extraocular movements intact.     Pupils: Pupils are equal, round, and reactive to light.  Cardiovascular:     Rate and Rhythm: Normal rate.     Heart sounds: No murmur heard.  No friction rub. No gallop.  Pulmonary:     Effort: Pulmonary effort is normal.     Breath sounds: Normal breath sounds.   Chest:     Chest wall: Tenderness present.  Breasts:    Left: Swelling, skin change and tenderness present.    Abdominal:     General: Abdomen is flat.     Palpations: Abdomen is soft.  Lymphadenopathy:     Cervical: Cervical adenopathy present.     Upper Body:     Left upper body: No supraclavicular, axillary or pectoral adenopathy.  Skin:    Capillary Refill: Capillary refill takes less than 2 seconds.  Neurological:     General: No focal deficit present.     Mental Status: She is alert.  Psychiatric:        Mood and Affect: Mood normal.     UC Treatments / Results  Labs (all labs ordered are listed, but only abnormal results are displayed) Labs Reviewed - No data to display  EKG   Radiology No results found.  Procedures Procedures (including critical care time)  Medications Ordered in UC Medications - No data to display  Initial Impression / Assessment and Plan / UC Course  I have reviewed the triage vital signs and the nursing notes.  Pertinent labs & imaging results that were available during my care of the patient were reviewed by me and considered in my medical decision making (see chart for details).     Left nipple redness and tenderness - symptoms concerning for mastitis, although not currently breastfeeding. No masses or lumps palpated Chronic headache Sinusitis with sore throat   Possible signs of early mastitis of the left nipple.  Patient also with sinusitis like symptoms, I feel this is what is causing her headache as well.  We will treat with amoxicillin to cover both for the sinus infection as well as a possible mastitis.  She may use ibuprofen or Aleve for the headache.  Did provide strict return precautions if the breast pain worsens or does not improve after antibiotic therapy.  Discussed this also could be menses related.  Recommend following up with her OB/GYN or PCP.  Patient is agreeable and will set up appointment.  Return precautions  provided. Final Clinical Impressions(s) / UC Diagnoses   Final diagnoses:  Mastitis  Tension-type headache, not intractable, unspecified chronicity pattern  Chronic sinusitis, unspecified location     Discharge Instructions      Complete Amoxicillin antibiotic --> take 1 tablet twice a day with food for the next 7 days  May use ibuprofen or Aleve for headache-like pain  If the headache persists greater than the next 7-10 days, would recommend following up with your PCP  If the breastpain progresses or does not get better after completion of the antibiotic, would recommend following up with your PCP or OB/GYN     ED Prescriptions     Medication Sig Dispense Auth. Provider   amoxicillin (AMOXIL) 875 MG tablet Take 1 tablet (875 mg total) by mouth 2 (two) times daily for 7 days. 14 tablet Madelyn Brunner, DO      PDMP not reviewed this encounter.   Madelyn Brunner, DO 06/27/21 1203

## 2021-06-27 NOTE — ED Triage Notes (Signed)
headache for one week.  Patient reports left nipple is hurting and thinks it feels like there is something there

## 2021-06-29 DIAGNOSIS — Z419 Encounter for procedure for purposes other than remedying health state, unspecified: Secondary | ICD-10-CM | POA: Diagnosis not present

## 2021-06-30 ENCOUNTER — Emergency Department (HOSPITAL_COMMUNITY)
Admission: EM | Admit: 2021-06-30 | Discharge: 2021-06-30 | Disposition: A | Discharge status: Home / Self Care | Payer: Medicaid Other | Attending: Emergency Medicine | Admitting: Emergency Medicine

## 2021-06-30 DIAGNOSIS — Z91040 Latex allergy status: Secondary | ICD-10-CM | POA: Insufficient documentation

## 2021-06-30 DIAGNOSIS — N61 Mastitis without abscess: Secondary | ICD-10-CM | POA: Insufficient documentation

## 2021-06-30 DIAGNOSIS — N644 Mastodynia: Secondary | ICD-10-CM

## 2021-06-30 DIAGNOSIS — N9489 Other specified conditions associated with female genital organs and menstrual cycle: Secondary | ICD-10-CM | POA: Diagnosis not present

## 2021-06-30 DIAGNOSIS — L03313 Cellulitis of chest wall: Secondary | ICD-10-CM | POA: Diagnosis not present

## 2021-06-30 LAB — BASIC METABOLIC PANEL
Anion gap: 9 (ref 5–15)
BUN: 6 mg/dL (ref 6–20)
CO2: 27 mmol/L (ref 22–32)
Calcium: 9.1 mg/dL (ref 8.9–10.3)
Chloride: 103 mmol/L (ref 98–111)
Creatinine, Ser: 0.64 mg/dL (ref 0.44–1.00)
GFR, Estimated: >60 mL/min (ref >60)
Glucose, Bld: 96 mg/dL (ref 70–99)
Potassium: 3.3 mmol/L — ABNORMAL LOW (ref 3.5–5.1)
Sodium: 139 mmol/L (ref 135–145)

## 2021-06-30 LAB — CBC WITH DIFFERENTIAL/PLATELET
Abs Immature Granulocytes: 0.01 10*3/uL (ref 0.00–0.07)
Basophils Absolute: 0 10*3/uL (ref 0.0–0.1)
Basophils Relative: 0 %
Eosinophils Absolute: 0.4 10*3/uL (ref 0.0–0.5)
Eosinophils Relative: 6 %
HCT: 40.2 % (ref 36.0–46.0)
Hemoglobin: 12.9 g/dL (ref 12.0–15.0)
Immature Granulocytes: 0 %
Lymphocytes Relative: 30 %
Lymphs Abs: 2.3 10*3/uL (ref 0.7–4.0)
MCH: 28.7 pg (ref 26.0–34.0)
MCHC: 32.1 g/dL (ref 30.0–36.0)
MCV: 89.3 fL (ref 80.0–100.0)
Monocytes Absolute: 0.6 10*3/uL (ref 0.1–1.0)
Monocytes Relative: 7 %
Neutro Abs: 4.5 10*3/uL (ref 1.7–7.7)
Neutrophils Relative %: 57 %
Platelets: 299 10*3/uL (ref 150–400)
RBC: 4.5 MIL/uL (ref 3.87–5.11)
RDW: 11.9 % (ref 11.5–15.5)
WBC: 7.9 10*3/uL (ref 4.0–10.5)
nRBC: 0 % (ref 0.0–0.2)

## 2021-06-30 LAB — I-STAT BETA HCG BLOOD, ED (MC, WL, AP ONLY): I-stat hCG, quantitative: <5 m[IU]/mL (ref <5)

## 2021-06-30 LAB — LACTIC ACID, PLASMA: Lactic Acid, Venous: 1.7 mmol/L (ref 0.5–1.9)

## 2021-06-30 MED ORDER — MORPHINE SULFATE (PF) 4 MG/ML IV SOLN
4.0000 mg | Freq: Once | INTRAVENOUS | Status: AC
  Administered 2021-06-30: 4 mg via INTRAVENOUS
  Filled 2021-06-30: qty 1

## 2021-06-30 MED ORDER — OXYCODONE-ACETAMINOPHEN 5-325 MG PO TABS: 1.0000 | ORAL_TABLET | Freq: Four times a day (QID) | ORAL | 0 refills | Status: DC | PRN

## 2021-06-30 MED ORDER — SULFAMETHOXAZOLE-TRIMETHOPRIM 800-160 MG PO TABS: 1.0000 | ORAL_TABLET | Freq: Two times a day (BID) | ORAL | 0 refills | Status: AC

## 2021-06-30 MED ORDER — SULFAMETHOXAZOLE-TRIMETHOPRIM 800-160 MG PO TABS
1.0000 | ORAL_TABLET | Freq: Once | ORAL | Status: AC
  Administered 2021-06-30: 1 via ORAL
  Filled 2021-06-30: qty 1

## 2021-06-30 MED ORDER — OXYCODONE-ACETAMINOPHEN 5-325 MG PO TABS
1.0000 | ORAL_TABLET | Freq: Once | ORAL | Status: AC
  Administered 2021-06-30: 1 via ORAL
  Filled 2021-06-30: qty 1

## 2021-06-30 NOTE — ED Provider Notes (Signed)
[facility] EMERGENCY DEPARTMENT Provider Note   CSN: [id number] Arrival date & time: 06/30/21  1038     History  Chief Complaint  Patient presents with   Breast Pain    Andrea Foster is a 31 y.o. female.  The history is provided by the patient and medical records. No language interpreter was used.  Chest Pain Pain location:  L chest Pain quality: aching and sharp   Pain radiates to:  Does not radiate Pain severity:  Severe Onset quality:  Gradual Duration:  5 days Timing:  Constant Progression:  Unchanged Relieved by:  Nothing Ineffective treatments:  None tried Associated symptoms: no abdominal pain, no back pain, no cough, no diaphoresis, no fatigue, no fever (chills present), no headache, no lower extremity edema, no nausea, no near-syncope, no palpitations, no shortness of breath, no vomiting and no weakness   Risk factors: no coronary artery disease, not female and no prior DVT/PE       Home Medications Prior to Admission medications   Medication Sig Start Date End Date Taking? Authorizing Provider  acetaminophen (TYLENOL) 500 MG tablet Take 2 tablets (1,000 mg total) by mouth every 8 (eight) hours as needed (for pain scale < 4). 07/03/20   Myriam Jacobson, Arlana Pouch, MD  amoxicillin (AMOXIL) 875 MG tablet Take 1 tablet (875 mg total) by mouth 2 (two) times daily for 7 days. 06/27/21 07/04/21  Madelyn Brunner, DO  docusate sodium (COLACE) 100 MG capsule Take 1 capsule (100 mg total) by mouth 2 (two) times daily. Patient not taking: Reported on 06/27/2021 07/03/20   Gita Kudo, MD  ibuprofen (ADVIL) 600 MG tablet Take 1 tablet (600 mg total) by mouth every 6 (six) hours. 07/03/20   Gita Kudo, MD  Iron, Ferrous Sulfate, 325 (65 Fe) MG TABS Take 1 tablet by mouth daily. Patient not taking: Reported on 06/27/2021 02/12/20   Marylene Land, CNM  Prenatal 27-1 MG TABS Take 1 tablet by mouth daily. Patient not taking: Reported on 06/27/2021 02/12/20    Marylene Land, CNM      Allergies    Latex and Other    Review of Systems   Review of Systems  Constitutional:  Positive for chills. Negative for diaphoresis, fatigue and fever (chills present).  HENT:  Negative for congestion.   Respiratory:  Negative for cough, chest tightness, shortness of breath and wheezing.   Cardiovascular:  Positive for chest pain. Negative for palpitations, leg swelling and near-syncope.  Gastrointestinal:  Negative for abdominal pain, constipation, diarrhea, nausea and vomiting.  Genitourinary:  Negative for dysuria.  Musculoskeletal:  Negative for back pain.  Neurological:  Negative for weakness, light-headedness and headaches.  Psychiatric/Behavioral:  Negative for agitation.   All other systems reviewed and are negative.  Physical Exam Updated Vital Signs BP 119/86    Pulse 74    Temp 98 F (36.7 C) (Oral)    Resp 15    LMP 06/23/2021    SpO2 100%  Physical Exam Vitals and nursing note reviewed. Exam conducted with a chaperone present.  Constitutional:      General: She is not in acute distress.    Appearance: She is well-developed. She is not ill-appearing, toxic-appearing or diaphoretic.  HENT:     Head: Normocephalic and atraumatic.  Eyes:     Conjunctiva/sclera: Conjunctivae normal.  Cardiovascular:     Rate and Rhythm: Normal rate and regular rhythm.     Heart sounds: No murmur heard. Pulmonary:  Effort: Pulmonary effort is normal. No respiratory distress.     Breath sounds: Normal breath sounds. No stridor. No rhonchi.  Chest:     Chest wall: Tenderness (left nippe and reaola) present.    Abdominal:     Palpations: Abdomen is soft.     Tenderness: There is no abdominal tenderness. There is no guarding or rebound.  Musculoskeletal:        General: Tenderness present. No swelling.     Cervical back: Neck supple. No tenderness.  Skin:    General: Skin is warm and dry.     Capillary Refill: Capillary refill takes less  than 2 seconds.     Findings: Erythema present.  Neurological:     General: No focal deficit present.     Mental Status: She is alert.  Psychiatric:        Mood and Affect: Mood normal.    ED Results / Procedures / Treatments   Labs (all labs ordered are listed, but only abnormal results are displayed) Labs Reviewed  BASIC METABOLIC PANEL - Abnormal; Notable for the following components:      Result Value   Potassium 3.3 (*)    All other components within normal limits  CULTURE, BLOOD (ROUTINE X 2)  CULTURE, BLOOD (ROUTINE X 2)  CBC WITH DIFFERENTIAL/PLATELET  LACTIC ACID, PLASMA  LACTIC ACID, PLASMA  I-STAT BETA HCG BLOOD, ED ([facility], [facility], [facility] ONLY)    EKG None  Radiology No results found.  Procedures Procedures    Medications Ordered in ED Medications  morphine (PF) 4 MG/ML injection 4 mg (4 mg Intravenous Given 06/30/21 1921)  sulfamethoxazole-trimethoprim (BACTRIM DS) 800-160 MG per tablet 1 tablet (1 tablet Oral Given 06/30/21 2159)  oxyCODONE-acetaminophen (PERCOCET/ROXICET) 5-325 MG per tablet 1 tablet (1 tablet Oral Given 06/30/21 2159)    ED Course/ Medical Decision Making/ A&P                           Medical Decision Making Amount and/or Complexity of Data Reviewed Labs: ordered.  Risk Prescription drug management.    Andrea Foster is a 31 y.o. female with a past medical history significant for previous gestational hypertension and asthma who presents for left nipple pain and chills.  According to patient, since Sunday, the last 5 days, she has had pain and swelling and redness to her left breast primarily at the nipple site.  She has a history of previous nipple piercing and says that for the last 4 years she has not had any piercing inside it.  She says that she has not been breast-feeding as her youngest is nearly-year-old.  She says that she saw urgent care on Sunday and was started on amoxicillin.  She is now on day 5 of antibiotics but is having  worsened pain and swelling and now having waxing and waning chills.  She reports no nausea, vomiting, shortness of breath, cough, congestion, urinary changes, constipation, or diarrhea.  She denies history of significant skin infections abscesses or boils in the past.  She reports no drainage from the nipple itself or the previous piercing sites but she reports the area continues to swell and her pain continues to worsen.  Is currently 8 out of 10 in severity.  As it has been worsening despite antibiotics, she was told to come in for further evaluation.  On my exam with a chaperone, patient does indeed have asymmetric swelling of the left nipple and central  areola compared to the right breast.  There is also tenderness and induration of the nipple itself and bulging at the piercing site.  No purulence is seen and patient denies any drainage at any point during this time.  There is also some tenderness and hardness behind the nipple, unclear if it is glandular or abscess.  She reports some tenderness surrounding the areola but no erythema seen.  Exam otherwise unremarkable with clear breath sounds.  Clinically concerned about worsening cellulitis versus developing abscess in the nipple.  Will speak with general surgery to discuss if they feel that imaging will be necessary versus having them evaluate.  We discussed the possibility of admission for IV antibiotics versus changing antibiotics and seeing the breast center.  We will get some screening labs to help guide our decision and talk to general surgery.  Anticipate reassessment.  7:07 PM Spoke to Dr. Cliffton Asters with general surgery.  He thinks that the breath center would be the best place for the patient to get evaluated and possible aspiration under ultrasound guidance.  He feels it is reasonable to switch her to Bactrim and its reasonable to also get the blood work we discussed.  After blood work returns, anticipate discharge for her to call the breath  center tomorrow to get seen.  He did not feel that emergent imaging tonight would be necessary at this time  9:40 PM Labs returned overall reassuring.  Will do the plan as discussed with general surgery and will give a dose of Bactrim, and give her prescription for the Bactrim, pain medicine, and have her see the breast center tomorrow.  Patient agrees with this plan and she will be discharged.         Final Clinical Impression(s) / ED Diagnoses Final diagnoses:  Breast pain, left  Cellulitis of breast    Rx / DC Orders ED Discharge Orders          Ordered    sulfamethoxazole-trimethoprim (BACTRIM DS) 800-160 MG tablet  2 times daily        06/30/21 2143    oxyCODONE-acetaminophen (PERCOCET/ROXICET) 5-325 MG tablet  Every 6 hours PRN        06/30/21 2143    Ambulatory referral to Breast Clinic       Comments: General surgery recommended seeing the breast center tomorrow for evaluation and management of suspected breast abscess.   06/30/21 2143            Clinical Impression: 1. Breast pain, left   2. Cellulitis of breast     Disposition: Discharge  Condition: Good  I have discussed the results, Dx and Tx plan with the pt(& family if present). He/she/they expressed understanding and agree(s) with the plan. Discharge instructions discussed at great length. Strict return precautions discussed and pt &/or family have verbalized understanding of the instructions. No further questions at time of discharge.    Discharge Medication List as of 06/30/2021  9:44 PM     START taking these medications   Details  oxyCODONE-acetaminophen (PERCOCET/ROXICET) 5-325 MG tablet Take 1 tablet by mouth every 6 (six) hours as needed for severe pain., Starting Thu 06/30/2021, Normal    sulfamethoxazole-trimethoprim (BACTRIM DS) 800-160 MG tablet Take 1 tablet by mouth 2 (two) times daily for 7 days., Starting Thu 06/30/2021, Until Thu 07/07/2021, Normal        Follow Up: Imaging, The  [facility] [address] [place] [place] [address] [phone]  [facility] [address] [id number] [place] [id number] [phone]    Surgery, [facility] [address] [place] [id number] [phone]         Tashay Bozich, Canary Brimhristopher J, MD 06/30/21 2236

## 2021-06-30 NOTE — ED Triage Notes (Signed)
Pt. Stated, ive had left breast pain for over a week. Went to UC on Monday given Amoxicillin and its not better its seems worse. Its swollen, tender to touch, nothing feels good. ?

## 2021-06-30 NOTE — ED Provider Triage Note (Signed)
Emergency Medicine Provider Triage Evaluation Note ? ?Andrea Foster , a 31 y.o. female  was evaluated in triage.  Pt complains of left breast mostly at nipple without DC. ? ?Currently on amox ? ?Review of Systems  ?Positive: Breast pain ?Negative: Fever  ? ?Physical Exam  ?BP 113/83 (BP Location: Right Arm)   Pulse 79   Temp 98.7 ?F (37.1 ?C) (Oral)   Resp 14   LMP 06/23/2021   SpO2 100%  ?Gen:   Awake, no distress   ?Resp:  Normal effort ?MSK:   Moves extremities without difficulty  ?Other:   ? ?Medical Decision Making  ?Medically screening exam initiated at 11:46 AM.  Appropriate orders placed.  Andrea Foster was informed that the remainder of the evaluation will be completed by another provider, this initial triage assessment does not replace that evaluation, and the importance of remaining in the ED until their evaluation is complete. ? ?Pt deferred exam until being seen in major care ?  ?Gailen Shelter, [place] ?06/30/21 1148 ? ?

## 2021-06-30 NOTE — Discharge Instructions (Signed)
Your history, exam, work-up today are concerning for a breast infection that has not been responding to the first antibiotic.  After I spoke with general surgery, they want you to see the breast center tomorrow to discuss further management but we will go ahead and give you a stronger antibiotic.  Please also use the pain medicine to help with symptoms and rest and stay hydrated.  If any symptoms change or worsen, please return to the nearest emergency department. ?

## 2021-07-01 ENCOUNTER — Telehealth: Payer: Self-pay | Admitting: *Deleted

## 2021-07-01 DIAGNOSIS — N611 Abscess of the breast and nipple: Secondary | ICD-10-CM | POA: Diagnosis not present

## 2021-07-01 NOTE — Telephone Encounter (Signed)
Pt called regarding [facility] not receiving referral from ED.  RNCM contacted [facility] to find that they do not accept referrals from ED.  Pt must get referral from PCP or OB/GYN. ?

## 2021-07-05 ENCOUNTER — Other Ambulatory Visit: Payer: Self-pay | Admitting: *Deleted

## 2021-07-05 DIAGNOSIS — N644 Mastodynia: Secondary | ICD-10-CM

## 2021-07-05 LAB — CULTURE, BLOOD (ROUTINE X 2): Culture: NO GROWTH

## 2021-07-30 DIAGNOSIS — Z419 Encounter for procedure for purposes other than remedying health state, unspecified: Secondary | ICD-10-CM | POA: Diagnosis not present

## 2021-08-29 DIAGNOSIS — Z419 Encounter for procedure for purposes other than remedying health state, unspecified: Secondary | ICD-10-CM | POA: Diagnosis not present

## 2021-09-29 DIAGNOSIS — Z419 Encounter for procedure for purposes other than remedying health state, unspecified: Secondary | ICD-10-CM | POA: Diagnosis not present

## 2021-10-29 DIAGNOSIS — Z419 Encounter for procedure for purposes other than remedying health state, unspecified: Secondary | ICD-10-CM | POA: Diagnosis not present

## 2021-11-29 DIAGNOSIS — Z419 Encounter for procedure for purposes other than remedying health state, unspecified: Secondary | ICD-10-CM | POA: Diagnosis not present

## 2021-12-30 DIAGNOSIS — Z419 Encounter for procedure for purposes other than remedying health state, unspecified: Secondary | ICD-10-CM | POA: Diagnosis not present

## 2022-01-06 DIAGNOSIS — Z0000 Encounter for general adult medical examination without abnormal findings: Secondary | ICD-10-CM | POA: Diagnosis not present

## 2022-01-06 DIAGNOSIS — Z6830 Body mass index (BMI) 30.0-30.9, adult: Secondary | ICD-10-CM | POA: Diagnosis not present

## 2022-01-06 DIAGNOSIS — Z3040 Encounter for surveillance of contraceptives, unspecified: Secondary | ICD-10-CM | POA: Diagnosis not present

## 2022-01-06 DIAGNOSIS — Z113 Encounter for screening for infections with a predominantly sexual mode of transmission: Secondary | ICD-10-CM | POA: Diagnosis not present

## 2022-01-06 DIAGNOSIS — N898 Other specified noninflammatory disorders of vagina: Secondary | ICD-10-CM | POA: Diagnosis not present

## 2022-01-06 DIAGNOSIS — Z0001 Encounter for general adult medical examination with abnormal findings: Secondary | ICD-10-CM | POA: Diagnosis not present

## 2022-01-29 DIAGNOSIS — Z419 Encounter for procedure for purposes other than remedying health state, unspecified: Secondary | ICD-10-CM | POA: Diagnosis not present

## 2022-03-01 DIAGNOSIS — Z419 Encounter for procedure for purposes other than remedying health state, unspecified: Secondary | ICD-10-CM | POA: Diagnosis not present

## 2022-03-31 DIAGNOSIS — Z419 Encounter for procedure for purposes other than remedying health state, unspecified: Secondary | ICD-10-CM | POA: Diagnosis not present

## 2022-05-01 DIAGNOSIS — Z419 Encounter for procedure for purposes other than remedying health state, unspecified: Secondary | ICD-10-CM | POA: Diagnosis not present

## 2022-06-01 DIAGNOSIS — Z419 Encounter for procedure for purposes other than remedying health state, unspecified: Secondary | ICD-10-CM | POA: Diagnosis not present

## 2022-06-30 DIAGNOSIS — Z419 Encounter for procedure for purposes other than remedying health state, unspecified: Secondary | ICD-10-CM | POA: Diagnosis not present

## 2022-07-03 ENCOUNTER — Ambulatory Visit (INDEPENDENT_AMBULATORY_CARE_PROVIDER_SITE_OTHER): Payer: Medicaid Other | Admitting: Nurse Practitioner

## 2022-07-03 ENCOUNTER — Encounter: Payer: Self-pay | Admitting: Nurse Practitioner

## 2022-07-03 VITALS — BP 118/72 | HR 70 | Ht 64.5 in | Wt 156.4 lb

## 2022-07-03 DIAGNOSIS — E559 Vitamin D deficiency, unspecified: Secondary | ICD-10-CM | POA: Diagnosis not present

## 2022-07-03 DIAGNOSIS — Z862 Personal history of diseases of the blood and blood-forming organs and certain disorders involving the immune mechanism: Secondary | ICD-10-CM | POA: Diagnosis not present

## 2022-07-03 DIAGNOSIS — Z0000 Encounter for general adult medical examination without abnormal findings: Secondary | ICD-10-CM

## 2022-07-03 DIAGNOSIS — Z419 Encounter for procedure for purposes other than remedying health state, unspecified: Secondary | ICD-10-CM | POA: Insufficient documentation

## 2022-07-03 HISTORY — DX: Encounter for general adult medical examination without abnormal findings: Z00.00

## 2022-07-03 NOTE — Patient Instructions (Signed)
It was great meeting you today! You look fantastic!!  Have a wonderful time on your birthday trip!!

## 2022-07-03 NOTE — Progress Notes (Signed)
Orma Render, DNP, AGNP-c [facility] [address] [place], [place] [address] [place] [phone]   New patient visit   Patient: Andrea Foster   DOB: Oct 09, 1990   32 y.o. Female  MRN: [id number] Visit Date: 07/03/2022  Patient Care Team: Orma Render, NP as PCP - General (Nurse Practitioner)  Today's Vitals   07/03/22 1021  BP: 118/72  Pulse: 70  Weight: 156 lb 6.4 oz (70.9 kg)  Height: 5' 4.5" (1.638 m)   Body mass index is 26.43 kg/m.   Today's healthcare provider: Orma Render, NP   Chief Complaint  Patient presents with   other    New pt. Est. No other issues, has obgyn,    Subjective    Andrea Foster is a 32 y.o. female who presents today as a new patient to establish care.    The patient, Andrea Foster, presents for a routine visit to establish care with a primary care provider, following the recommendation of her OB. Andrea Foster has a medical history significant for anemia, which was noted during her second pregnancy. She has been managing this condition with iron supplements, taken every other day since the diagnosis.  Andrea Foster also reports a past episode of mastitis last year, which required treatment [facility]. She confirms that there has been no recurrence of this issue since then. She is a mother of three, with her youngest child celebrating their second birthday today.  At present, Andrea Foster has no health concerns or symptoms to report.  History reviewed and reveals the following: Past Medical History:  Diagnosis Date   Allergy    Anemia    Asthma    seasonal as a child   Depression    Encounter for annual physical exam 07/03/2022   Gonorrhea    Hx of chlamydia infection 2013   Past Surgical History:  Procedure Laterality Date   NO PAST SURGERIES     Family Status  Relation Name Status   Mother  Alive   Father  Alive   MGM  Alive   MGF Ubaldo Glassing Deceased   PGM  Alive   PGF  Alive    Neg Hx  (Not Specified)   Family History  Problem Relation Age of Onset   Cancer Maternal Grandfather        ? agent orange   Asthma Neg Hx    Diabetes Neg Hx    Heart disease Neg Hx    Hypertension Neg Hx    Stroke Neg Hx    Social History   Socioeconomic History   Marital status: Single    Spouse name: Not on file   Number of children: Not on file   Years of education: Not on file   Highest education level: Not on file  Occupational History   Not on file  Tobacco Use   Smoking status: Never   Smokeless tobacco: Never  Vaping Use   Vaping Use: Never used  Substance and Sexual Activity   Alcohol use: No    Alcohol/week: 1.0 standard drink of alcohol    Types: 1 Shots of liquor per week    Comment: rare, spicial occasions, not while preg   Drug use: No   Sexual activity: Yes    Birth control/protection: Other-see comments, None    Comment: Partner has a vasectomy  Other Topics Concern   Not on file  Social History Narrative   Not on file  Social Determinants of Health   Financial Resource Strain: Not on file  Food Insecurity: No Food Insecurity (07/01/2020)   Hunger Vital Sign    Worried About Running Out of Food in the Last Year: Never true    Ran Out of Food in the Last Year: Never true  Transportation Needs: No Transportation Needs (07/01/2020)   PRAPARE - Hydrologist (Medical): No    Lack of Transportation (Non-Medical): No  Physical Activity: Not on file  Stress: Not on file  Social Connections: Not on file   Outpatient Medications Prior to Visit  Medication Sig Note   Iron, Ferrous Sulfate, 325 (65 Fe) MG TABS Take 1 tablet by mouth daily. (Patient not taking: Reported on 06/27/2021) 07/03/2022: Prn just started again recently last week every other day   [DISCONTINUED] acetaminophen (TYLENOL) 500 MG tablet Take 2 tablets (1,000 mg total) by mouth every 8 (eight) hours as needed (for pain scale < 4). (Patient not taking:  Reported on 06/30/2021)    [DISCONTINUED] docusate sodium (COLACE) 100 MG capsule Take 1 capsule (100 mg total) by mouth 2 (two) times daily. (Patient not taking: Reported on 06/27/2021)    [DISCONTINUED] ibuprofen (ADVIL) 600 MG tablet Take 1 tablet (600 mg total) by mouth every 6 (six) hours. (Patient not taking: Reported on 06/30/2021)    [DISCONTINUED] naproxen sodium (ALEVE) 220 MG tablet Take 220 mg by mouth. (Patient not taking: Reported on 07/03/2022)    [DISCONTINUED] oxyCODONE-acetaminophen (PERCOCET/ROXICET) 5-325 MG tablet Take 1 tablet by mouth every 6 (six) hours as needed for severe pain. (Patient not taking: Reported on 07/03/2022)    [DISCONTINUED] Prenatal 27-1 MG TABS Take 1 tablet by mouth daily. (Patient not taking: Reported on 06/27/2021)    No facility-administered medications prior to visit.   Allergies  Allergen Reactions   Latex Hives   Other Anaphylaxis    walnuts   Immunization History  Administered Date(s) Administered   MMR 04/28/2017   Tdap 03/02/2017, 05/08/2018, 04/16/2020    Health Maintenance Due The patient has no Health Maintenance topics of status Overdue, Due On, or Due Soon   Review of Systems All review of systems negative except what is listed in the HPI   Objective    BP 118/72   Pulse 70   Ht 5' 4.5" (1.638 m)   Wt 156 lb 6.4 oz (70.9 kg)   LMP 06/26/2022   Breastfeeding No   BMI 26.43 kg/m  Physical Exam Vitals and nursing note reviewed.  Constitutional:      General: She is not in acute distress.    Appearance: Normal appearance.  HENT:     Head: Normocephalic and atraumatic.     Right Ear: Hearing, tympanic membrane, ear canal and external ear normal.     Left Ear: Hearing, tympanic membrane, ear canal and external ear normal.     Nose: Nose normal.     Right Sinus: No maxillary sinus tenderness or frontal sinus tenderness.     Left Sinus: No maxillary sinus tenderness or frontal sinus tenderness.     Mouth/Throat:     Lips:  Pink.     Mouth: Mucous membranes are moist.     Pharynx: Oropharynx is clear.  Eyes:     General: Lids are normal. Vision grossly intact.     Extraocular Movements: Extraocular movements intact.     Conjunctiva/sclera: Conjunctivae normal.     Pupils: Pupils are equal, round, and reactive to light.  Funduscopic exam:    Right eye: Red reflex present.        Left eye: Red reflex present.    Visual Fields: Right eye visual fields normal and left eye visual fields normal.  Neck:     Thyroid: No thyromegaly.     Vascular: No carotid bruit.  Cardiovascular:     Rate and Rhythm: Normal rate and regular rhythm.     Chest Wall: PMI is not displaced.     Pulses: Normal pulses.          Dorsalis pedis pulses are 2+ on the right side and 2+ on the left side.       Posterior tibial pulses are 2+ on the right side and 2+ on the left side.     Heart sounds: Normal heart sounds. No murmur heard. Pulmonary:     Effort: Pulmonary effort is normal. No respiratory distress.     Breath sounds: Normal breath sounds.  Abdominal:     General: Abdomen is flat. Bowel sounds are normal. There is no distension.     Palpations: Abdomen is soft. There is no hepatomegaly, splenomegaly or mass.     Tenderness: There is no abdominal tenderness. There is no right CVA tenderness, left CVA tenderness, guarding or rebound.  Musculoskeletal:        General: Normal range of motion.     Cervical back: Full passive range of motion without pain, normal range of motion and neck supple. No tenderness.     Right lower leg: No edema.     Left lower leg: No edema.  Feet:     Left foot:     Toenail Condition: Left toenails are normal.  Lymphadenopathy:     Cervical: No cervical adenopathy.     Upper Body:     Right upper body: No supraclavicular adenopathy.     Left upper body: No supraclavicular adenopathy.  Skin:    General: Skin is warm and dry.     Capillary Refill: Capillary refill takes less than 2 seconds.      Nails: There is no clubbing.  Neurological:     General: No focal deficit present.     Mental Status: She is alert and oriented to person, place, and time.     GCS: GCS eye subscore is 4. GCS verbal subscore is 5. GCS motor subscore is 6.     Sensory: Sensation is intact.     Motor: Motor function is intact.     Coordination: Coordination is intact.     Gait: Gait is intact.     Deep Tendon Reflexes: Reflexes are normal and symmetric.  Psychiatric:        Attention and Perception: Attention normal.        Mood and Affect: Mood normal.        Speech: Speech normal.        Behavior: Behavior normal. Behavior is cooperative.        Thought Content: Thought content normal.        Cognition and Memory: Cognition and memory normal.        Judgment: Judgment normal.     No results found for any visits on 07/03/22.  Assessment & Plan      Problem List Items Addressed This Visit     Encounter for annual physical exam - Primary    CPE today with no abnormalities noted on exam.  Labs pending. Will make changes as necessary based on results.  Review of HM activities and recommendations discussed and provided on AVS Anticipatory guidance, diet, and exercise recommendations provided.  Medications, allergies, and hx reviewed and updated as necessary.  Plan to f/u with CPE in 1 year or sooner for acute/chronic health needs as directed.        History of anemia    The patient has a history of anemia noted during her second pregnancy. She has recently restarted a regimen of iron supplements every other day. Plan: - Schedule a complete blood count (CBC) to evaluate current hemoglobin and hematocrit levels. - Encourage the continuation of iron supplementation as previously directed and recommend an iron-rich diet.       Other Visit Diagnoses     Health care maintenance       Relevant Orders   CBC with Differential/Platelet   Comprehensive metabolic panel   Hemoglobin A1c   Iron,  TIBC and Ferritin Panel   Lipid panel   VITAMIN D 25 Hydroxy (Vit-D Deficiency, Fractures)   TSH        Return in about 1 year (around 07/03/2023) for CPE today- CPE in 1 year.      Andrea Foster, Coralee Pesa, NP, DNP, AGNP-C [facility]

## 2022-07-03 NOTE — Assessment & Plan Note (Signed)

## 2022-07-03 NOTE — Assessment & Plan Note (Signed)
The patient has a history of anemia noted during her second pregnancy. She has recently restarted a regimen of iron supplements every other day. Plan: - Schedule a complete blood count (CBC) to evaluate current hemoglobin and hematocrit levels. - Encourage the continuation of iron supplementation as previously directed and recommend an iron-rich diet.

## 2022-07-04 LAB — COMPREHENSIVE METABOLIC PANEL
ALT: 10 IU/L (ref 0–32)
AST: 16 IU/L (ref 0–40)
Albumin/Globulin Ratio: 1.6 (ref 1.2–2.2)
Albumin: 4.3 g/dL (ref 4.0–5.0)
Alkaline Phosphatase: 82 IU/L (ref 44–121)
BUN/Creatinine Ratio: 15 (ref 9–23)
BUN: 12 mg/dL (ref 6–20)
Bilirubin Total: 0.5 mg/dL (ref 0.0–1.2)
CO2: 22 mmol/L (ref 20–29)
Calcium: 9.7 mg/dL (ref 8.7–10.2)
Chloride: 103 mmol/L (ref 96–106)
Creatinine, Ser: 0.8 mg/dL (ref 0.57–1.00)
Globulin, Total: 2.7 g/dL (ref 1.5–4.5)
Glucose: 92 mg/dL (ref 70–99)
Potassium: 4.4 mmol/L (ref 3.5–5.2)
Sodium: 138 mmol/L (ref 134–144)
Total Protein: 7 g/dL (ref 6.0–8.5)
eGFR: 102 mL/min/{1.73_m2} (ref >59)

## 2022-07-04 LAB — HEMOGLOBIN A1C
Est. average glucose Bld gHb Est-mCnc: 117 mg/dL
Hgb A1c MFr Bld: 5.7 % — ABNORMAL HIGH (ref 4.8–5.6)

## 2022-07-04 LAB — LIPID PANEL
Chol/HDL Ratio: 3 ratio (ref 0.0–4.4)
Cholesterol, Total: 178 mg/dL (ref 100–199)
HDL: 59 mg/dL (ref >39)
LDL Chol Calc (NIH): 109 mg/dL — ABNORMAL HIGH (ref 0–99)
Triglycerides: 53 mg/dL (ref 0–149)
VLDL Cholesterol Cal: 10 mg/dL (ref 5–40)

## 2022-07-04 LAB — CBC WITH DIFFERENTIAL/PLATELET
Basophils Absolute: 0 10*3/uL (ref 0.0–0.2)
Basos: 1 %
EOS (ABSOLUTE): 0.7 10*3/uL — ABNORMAL HIGH (ref 0.0–0.4)
Eos: 14 %
Hematocrit: 36.6 % (ref 34.0–46.6)
Hemoglobin: 12.3 g/dL (ref 11.1–15.9)
Immature Grans (Abs): 0 10*3/uL (ref 0.0–0.1)
Immature Granulocytes: 0 %
Lymphocytes Absolute: 1.8 10*3/uL (ref 0.7–3.1)
Lymphs: 36 %
MCH: 28.7 pg (ref 26.6–33.0)
MCHC: 33.6 g/dL (ref 31.5–35.7)
MCV: 86 fL (ref 79–97)
Monocytes Absolute: 0.3 10*3/uL (ref 0.1–0.9)
Monocytes: 7 %
Neutrophils Absolute: 2.1 10*3/uL (ref 1.4–7.0)
Neutrophils: 42 %
Platelets: 268 10*3/uL (ref 150–450)
RBC: 4.28 x10E6/uL (ref 3.77–5.28)
RDW: 12 % (ref 11.7–15.4)
WBC: 4.8 10*3/uL (ref 3.4–10.8)

## 2022-07-04 LAB — VITAMIN D 25 HYDROXY (VIT D DEFICIENCY, FRACTURES): Vit D, 25-Hydroxy: 15.6 ng/mL — ABNORMAL LOW (ref 30.0–100.0)

## 2022-07-04 LAB — TSH: TSH: 0.648 u[IU]/mL (ref 0.450–4.500)

## 2022-07-04 LAB — IRON,TIBC AND FERRITIN PANEL
Ferritin: 74 ng/mL (ref 15–150)
Iron Saturation: 37 % (ref 15–55)
Iron: 124 ug/dL (ref 27–159)
Total Iron Binding Capacity: 331 ug/dL (ref 250–450)
UIBC: 207 ug/dL (ref 131–425)

## 2022-07-05 ENCOUNTER — Encounter: Payer: Self-pay | Admitting: Nurse Practitioner

## 2022-07-05 MED ORDER — VITAMIN D3 1.25 MG (50000 UT) PO TABS: 1.0000 | ORAL_TABLET | ORAL | 1 refills | Status: DC

## 2022-07-05 NOTE — Addendum Note (Signed)
Addended by: Shalik Sanfilippo, Clarise Cruz E on: 07/05/2022 11:37 PM   Modules accepted: Orders

## 2022-07-06 ENCOUNTER — Encounter: Payer: Self-pay | Admitting: Nurse Practitioner

## 2022-07-11 ENCOUNTER — Other Ambulatory Visit: Payer: Self-pay

## 2022-07-11 DIAGNOSIS — T7840XA Allergy, unspecified, initial encounter: Secondary | ICD-10-CM

## 2022-07-31 DIAGNOSIS — Z419 Encounter for procedure for purposes other than remedying health state, unspecified: Secondary | ICD-10-CM | POA: Diagnosis not present

## 2022-08-22 NOTE — Progress Notes (Signed)
New Patient Note  RE: IZETTA BARRANCO MRN: [id number] DOB: 12-May-1990 Date of Office Visit: 08/23/2022  Consult requested by: Tollie Eth, NP Primary care provider: Tollie Eth, NP  Chief Complaint: Asthma and Allergic Rhinitis   History of Present Illness: I had the pleasure of seeing Andrea Foster for initial evaluation at the [facility] on 08/23/2022. She is a 32 y.o. female, who is referred here by Early, Sung Amabile, NP for the evaluation of asthma and allergic rhinitis.  Asthma:  She reports symptoms of chest tightness, shortness of breath, coughing, wheezing, nocturnal awakenings for 20+ years which improved but then symptoms worsened during her second pregnancy about 5 years ago.  Current medications include albuterol prn which help. Albuterol caused palpitations in the past and doesn't like to use.   She reports not using aerochamber with inhalers. She tried the following inhalers: Flovent, Pulmicort, Advair. Main triggers are weather changes. In the last month, frequency of symptoms: 1x/week. Frequency of nocturnal symptoms: 0x/month. Frequency of SABA use: 1x/week. Interference with physical activity: no. Sleep is undisturbed. In the last 12 months, emergency room visits/urgent care visits/doctor office visits or hospitalizations due to respiratory issues: 0. In the last 12 months, oral steroids courses: 0. Lifetime history of hospitalization for respiratory issues: twice. Prior intubations: no. History of pneumonia: no. She was not evaluated by allergist/pulmonologist in the past. Smoking exposure: marijuana. Up to date with flu vaccine: no. Up to date with COVID-19 vaccine: no. Prior Covid-19 infection: no. History of reflux: sometimes.  Rhinitis She reports symptoms of nasal congestion, rhinorrhea, sneezing, itchy/watery eyes, itchy throat. Symptoms have been going on for many years. The symptoms are present all year around with worsening in spring and  fall. Anosmia: yes. Headache: yes. She has used Claritin, zyrtec with minimal  improvement in symptoms. Tried some otc nasal spray but made her nose itch. Sinus infections: no. Previous work up includes: none. Wears contacts.  Previous ENT evaluation: as a child and had tubes. Previous sinus imaging: no. Last eye exam: 1 year ago.  Assessment and Plan: Diyala is a 32 y.o. female with: Moderate persistent asthma without complication Diagnosed with asthma 20+ years ago and flared 5 years ago. Tried Flovent, Pulmicort and Advair as a child. Currently has symptoms about once per week and worse with weather changes. Albuterol in the past caused palpitations. Some reflux. Today's skin testing showed: Positive to grass, ragweed, weed, trees, mold, dust mites, cat, dog.  Today's spirometry showed: normal pattern with 9% and 210cc improvement in FEV1 post bronchodilator treatment. Clinically feeling improved.  Daily controller medication(s): start Breo 1 puff once a day and rinse mouth after each use. Demonstrated proper use.  During respiratory infections/flares:  Pretreat with albuterol 2 puffs. May use albuterol rescue inhaler 2 puffs every 4 to 6 hours as needed for shortness of breath, chest tightness, coughing, and wheezing. May use albuterol rescue inhaler 2 puffs 5 to 15 minutes prior to strenuous physical activities. Monitor frequency of use.  Get spirometry at next visit. If still having palpitations with albuterol will give levoalbuterol rx next.   Other allergic rhinitis Perennial rhinoconjunctivitis symptoms which flare in the spring and fall.  Tried Claritin and Zyrtec with minimal benefit.  No prior allergy evaluation.  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  Start environmental control measures as below. Start Singulair (montelukast) 10mg  daily  at night. Cautioned that in some  children/adults can experience behavioral changes including hyperactivity, agitation, depression, sleep disturbances and suicidal ideations. These side effects are rare, but if you notice them you should notify me and discontinue Singulair (montelukast). Use over the counter antihistamines such as Zyrtec (cetirizine), Claritin (loratadine), Allegra (fexofenadine), or Xyzal (levocetirizine) daily as needed. May take twice a day during allergy flares. May switch antihistamines every few months. Use Flonase (fluticasone) nasal spray 2 sprays per nostril once a day as needed for nasal congestion.  Use olopatadine eye drops 0.7% once a day as needed for itchy/watery eyes. Wait 10-15 min before putting contact lens in. Consider allergy injections for long term control if above medications do not help the symptoms - handout given.  Asthma must be in better control before starting.   Allergic conjunctivitis of both eyes See assessment and plan as above.  Oral allergy syndrome, subsequent encounter Perioral discomfort/itching after eating certain walnuts. No issues with other tree nuts and peanuts. Today's skin testing showed: Borderline to almond and hazelnut.  Avoid tree nuts. Discussed that her food triggered oral and throat symptoms are likely caused by oral food allergy syndrome (OFAS). This is caused by cross reactivity of pollen with fresh fruits and vegetables, and nuts. Symptoms are usually localized in the form of itching and burning in mouth and throat. Very rarely it can progress to more severe symptoms. Eating foods in cooked or processed forms usually minimizes symptoms. I recommended avoidance of eating the problem foods, especially during the peak season(s). Sometimes, OFAS can induce severe throat swelling or even a systemic reaction; with such instance, I advised them to report to a local ER.  Gastroesophageal reflux disease See handout for lifestyle and dietary modifications.  Return in  about 2 months (around 10/23/2022).  Meds ordered this encounter  Medications   Olopatadine HCl 0.7 % SOLN    Sig: Apply 1 drop to eye daily as needed (itchy/watery eyes).    Dispense:  2.5 mL    Refill:  3   fluticasone (FLONASE) 50 MCG/ACT nasal spray    Sig: Place 2 sprays into both nostrils daily as needed (nasal congestion).    Dispense:  16 g    Refill:  3   fluticasone furoate-vilanterol (BREO ELLIPTA) 100-25 MCG/ACT AEPB    Sig: Inhale 1 puff into the lungs daily. Rinse mouth after each use.    Dispense:  60 each    Refill:  3   albuterol (VENTOLIN HFA) 108 (90 Base) MCG/ACT inhaler    Sig: Inhale 2 puffs into the lungs every 4 (four) hours as needed for wheezing or shortness of breath (coughing fits).    Dispense:  18 g    Refill:  1   montelukast (SINGULAIR) 10 MG tablet    Sig: Take 1 tablet (10 mg total) by mouth at bedtime.    Dispense:  30 tablet    Refill:  3   Lab Orders  No laboratory test(s) ordered today    Other allergy screening: Food allergy: yes Walnuts cause perioral discomfort/itching in the esophagus but still likes to eat it.  Tolerates peanuts.   Medication allergy: no Hymenoptera allergy: no Urticaria: no Eczema:no History of recurrent infections suggestive of immunodeficency: no  Diagnostics: Spirometry:  Tracings reviewed. Her effort: Good reproducible efforts. FVC: 2.96L FEV1: 2.31L, 83% predicted FEV1/FVC ratio: 78% Interpretation: Spirometry consistent with normal pattern with 9% and 210cc improvement in FEV1 post bronchodilator treatment. Clinically feeling improved.   Please  see scanned spirometry results for details.  Skin Testing: Environmental allergy panel and select foods. Positive to grass, ragweed, weed, trees, mold, dust mites, cat, dog.  Borderline to almond and hazelnut.  Results discussed with patient/family.  Airborne Adult Perc - 08/23/22 1101     Time Antigen Placed 1050    Allergen Manufacturer Waynette Buttery     Location Back    1. Control-Buffer 50% Glycerol Negative    2. Control-Histamine 1 mg/ml 2+    3. Albumin saline Negative    4. Bahia Negative    5. [place] Negative    6. Johnson 2+    10. Sweet Vernal Negative    11. Timothy --   +/-   12. Cocklebur 2+    13. Burweed Marshelder --   +/-   14. Ragweed, short Negative    15. Ragweed, Giant Negative    16. Plantain,  English Negative    17. Lamb's Quarters Negative    18. Sheep Sorrell Negative    19. Rough Pigweed --   +/-   20. Marsh Elder, Rough 2+    21. Mugwort, Common Negative    22. Ash mix --   +/-   23. Birch mix 3+    24. Beech American 2+    25. Box, Elder 3+    26. Cedar, red 2+    27. Cottonwood, [place] Negative    28. Elm mix 2+    29. Hickory 2+    30. Maple mix Negative    31. Oak, [place] mix Negative    32. Pecan Pollen Negative    33. Pine mix Negative    34. Sycamore [place] --   +/-   35. Walnut, Black Pollen 3+    36. Alternaria alternata Negative    37. Cladosporium Herbarum 2+    38. Aspergillus mix Negative    39. Penicillium mix Negative    40. Bipolaris sorokiniana (Helminthosporium) Negative    41. Drechslera spicifera (Curvularia) 2+    42. Mucor plumbeus Negative    43. Fusarium moniliforme 2+    44. Aureobasidium pullulans (pullulara) Negative    45. Rhizopus oryzae 2+    46. Botrytis cinera Negative    47. Epicoccum nigrum Negative    48. Phoma betae Negative   +/-   49. Candida Albicans Negative    50. Trichophyton mentagrophytes Negative    51. Mite, D Farinae  5,000 AU/ml Negative    52. Mite, D Pteronyssinus  5,000 AU/ml 3+    53. Cat Hair 10,000 BAU/ml 2+    54.  Dog Epithelia Negative    55. Mixed Feathers Negative    56. Horse Epithelia Negative    57. Cockroach, German Negative    58. Mouse Negative    59. Tobacco Leaf Negative             Intradermal - 08/23/22 1100     Time Antigen Placed 1100    Allergen Manufacturer Greer    Location Arm    Number of Test  6    Control Negative    [place] 4+    Ragweed mix 3+    Mold 2 2+    Dog 2+    Cockroach Negative             Food Adult Perc - 08/23/22 1400     10. Cashew Negative    11. Pecan Food Negative    12. Walnut Food Negative    13. Almond --   +/-  14. Hazelnut --   +/-   15. [place] nut Negative    16. Coconut Negative    17. Pistachio Negative             Past Medical History: Patient Active Problem List   Diagnosis Date Noted   Moderate persistent asthma without complication 08/23/2022   Allergic conjunctivitis of both eyes 08/23/2022   Other allergic rhinitis 08/23/2022   Other adverse food reactions, not elsewhere classified, subsequent encounter 08/23/2022   Gastroesophageal reflux disease 08/23/2022   Oral allergy syndrome, subsequent encounter 08/23/2022   Encounter for annual physical exam 07/03/2022   History of anemia 07/03/2022   Past Medical History:  Diagnosis Date   Allergy    Anemia    Asthma    seasonal as a child   Depression    Encounter for annual physical exam 07/03/2022   Gastroesophageal reflux disease 08/23/2022   Gonorrhea    Hx of chlamydia infection 2013   Past Surgical History: Past Surgical History:  Procedure Laterality Date   NO PAST SURGERIES     Medication List:  Current Outpatient Medications  Medication Sig Dispense Refill   albuterol (VENTOLIN HFA) 108 (90 Base) MCG/ACT inhaler Inhale 2 puffs into the lungs every 4 (four) hours as needed for wheezing or shortness of breath (coughing fits). 18 g 1   Cholecalciferol (VITAMIN D3) 1.25 MG (50000 UT) TABS Take 1 tablet by mouth once a week. 12 tablet 1   fluticasone (FLONASE) 50 MCG/ACT nasal spray Place 2 sprays into both nostrils daily as needed (nasal congestion). 16 g 3   fluticasone furoate-vilanterol (BREO ELLIPTA) 100-25 MCG/ACT AEPB Inhale 1 puff into the lungs daily. Rinse mouth after each use. 60 each 3   montelukast (SINGULAIR) 10 MG tablet Take 1 tablet (10 mg  total) by mouth at bedtime. 30 tablet 3   Olopatadine HCl 0.7 % SOLN Apply 1 drop to eye daily as needed (itchy/watery eyes). 2.5 mL 3   No current facility-administered medications for this visit.   Allergies: Allergies  Allergen Reactions   Latex Hives   Other Anaphylaxis    walnuts   Social History: Social History   Socioeconomic History   Marital status: Single    Spouse name: Not on file   Number of children: Not on file   Years of education: Not on file   Highest education level: Not on file  Occupational History   Not on file  Tobacco Use   Smoking status: Never   Smokeless tobacco: Never  Vaping Use   Vaping Use: Never used  Substance and Sexual Activity   Alcohol use: Yes    Alcohol/week: 1.0 standard drink of alcohol    Types: 1 Shots of liquor per week    Comment: rare, spicial occasions, not while preg   Drug use: Yes    Types: Marijuana   Sexual activity: Yes    Birth control/protection: Other-see comments, None    Comment: Partner has a vasectomy  Other Topics Concern   Not on file  Social History Narrative   Not on file   Social Determinants of Health   Financial Resource Strain: Not on file  Food Insecurity: No Food Insecurity (07/01/2020)   Hunger Vital Sign    Worried About Running Out of Food in the Last Year: Never true    Ran Out of Food in the Last Year: Never true  Transportation Needs: No Transportation Needs (07/01/2020)   PRAPARE - Transportation  Lack of Transportation (Medical): No    Lack of Transportation (Non-Medical): No  Physical Activity: Not on file  Stress: Not on file  Social Connections: Not on file   Lives in a 30 year old house. 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 agent orange   Diabetes Neg Hx    Heart disease Neg Hx    Hypertension Neg Hx    Stroke Neg Hx    Review of Systems  Constitutional:  Negative for appetite change, chills, fever and unexpected weight change.  HENT:  Positive for congestion, rhinorrhea and sneezing.   Eyes:  Positive for itching.  Respiratory:  Positive for cough, chest tightness, shortness of breath and wheezing.   Cardiovascular:  Negative for chest pain.  Gastrointestinal:  Negative for abdominal pain.  Genitourinary:  Negative for difficulty urinating.  Skin:  Positive for rash.  Allergic/Immunologic: Positive for environmental allergies.  Neurological:  Negative for headaches.    Objective: BP 98/64   Pulse 74   Temp 98.4 F (36.9 C) (Temporal)   Resp 14   Ht 5' 4.57" (1.64 m)   Wt 156 lb 1.6 oz (70.8 kg)   SpO2 96%   BMI 26.33 kg/m  Body mass index is 26.33 kg/m. Physical Exam Vitals and nursing note reviewed.  Constitutional:      Appearance: Normal appearance. She is well-developed.  HENT:     Head: Normocephalic and atraumatic.     Right Ear: Tympanic membrane and external ear normal.     Left Ear: Tympanic membrane and external ear normal.     Nose: Nose normal.     Mouth/Throat:     Mouth: Mucous membranes are moist.     Pharynx: Oropharynx is clear.  Eyes:     Conjunctiva/sclera: Conjunctivae normal.  Cardiovascular:     Rate and Rhythm: Normal rate and regular rhythm.     Heart sounds: Normal heart sounds. No murmur heard.    No friction rub. No gallop.  Pulmonary:     Effort: Pulmonary effort is normal.     Breath sounds: Normal breath sounds. No wheezing, rhonchi or rales.  Musculoskeletal:     Cervical back: Neck supple.  Skin:    General: Skin is warm.     Findings: No rash.  Neurological:     Mental Status: She is alert and oriented to person, place, and  time.  Psychiatric:        Behavior: Behavior normal.    The plan was reviewed with the patient/family, and all questions/concerned were addressed.  It was my pleasure to see Mirela today and participate in her care. Please feel free to contact me with any questions or concerns.  Sincerely,  Wyline Mood, DO Allergy & Immunology  [facility] office: [phone] [facility] office: [phone]

## 2022-08-23 ENCOUNTER — Ambulatory Visit (INDEPENDENT_AMBULATORY_CARE_PROVIDER_SITE_OTHER): Payer: Medicaid Other | Admitting: Allergy

## 2022-08-23 ENCOUNTER — Encounter: Payer: Self-pay | Admitting: Allergy

## 2022-08-23 ENCOUNTER — Other Ambulatory Visit: Payer: Self-pay

## 2022-08-23 VITALS — BP 98/64 | HR 74 | Temp 98.4°F | Resp 14 | Ht 64.57 in | Wt 156.1 lb

## 2022-08-23 DIAGNOSIS — J4540 Moderate persistent asthma, uncomplicated: Secondary | ICD-10-CM

## 2022-08-23 DIAGNOSIS — J3089 Other allergic rhinitis: Secondary | ICD-10-CM | POA: Diagnosis not present

## 2022-08-23 DIAGNOSIS — K219 Gastro-esophageal reflux disease without esophagitis: Secondary | ICD-10-CM

## 2022-08-23 DIAGNOSIS — T781XXD Other adverse food reactions, not elsewhere classified, subsequent encounter: Secondary | ICD-10-CM

## 2022-08-23 DIAGNOSIS — H1010 Acute atopic conjunctivitis, unspecified eye: Secondary | ICD-10-CM | POA: Insufficient documentation

## 2022-08-23 DIAGNOSIS — H1013 Acute atopic conjunctivitis, bilateral: Secondary | ICD-10-CM

## 2022-08-23 DIAGNOSIS — T781XXA Other adverse food reactions, not elsewhere classified, initial encounter: Secondary | ICD-10-CM | POA: Diagnosis not present

## 2022-08-23 HISTORY — DX: Acute atopic conjunctivitis, bilateral: H10.13

## 2022-08-23 HISTORY — DX: Gastro-esophageal reflux disease without esophagitis: K21.9

## 2022-08-23 MED ORDER — FLUTICASONE FUROATE-VILANTEROL 100-25 MCG/ACT IN AEPB: 1.0000 | INHALATION_SPRAY | Freq: Every day | RESPIRATORY_TRACT | 3 refills | Status: DC

## 2022-08-23 MED ORDER — ALBUTEROL SULFATE HFA 108 (90 BASE) MCG/ACT IN AERS: 2.0000 | INHALATION_SPRAY | RESPIRATORY_TRACT | 1 refills | Status: AC | PRN

## 2022-08-23 MED ORDER — OLOPATADINE HCL 0.7 % OP SOLN
1.0000 [drp] | Freq: Every day | OPHTHALMIC | 3 refills | Status: DC | PRN
Start: 2022-08-23 — End: 2023-03-12

## 2022-08-23 MED ORDER — FLUTICASONE PROPIONATE 50 MCG/ACT NA SUSP: 2.0000 | Freq: Every day | NASAL | 3 refills | Status: DC | PRN

## 2022-08-23 MED ORDER — MONTELUKAST SODIUM 10 MG PO TABS
10.0000 mg | ORAL_TABLET | Freq: Every day | ORAL | 3 refills | Status: DC
Start: 2022-08-23 — End: 2022-11-08

## 2022-08-23 NOTE — Assessment & Plan Note (Signed)
See handout for lifestyle and dietary modifications. 

## 2022-08-23 NOTE — Assessment & Plan Note (Signed)
Diagnosed with asthma 20+ years ago and flared 5 years ago. Tried Flovent, Pulmicort and Advair as a child. Currently has symptoms about once per week and worse with weather changes. Albuterol in the past caused palpitations. Some reflux. Today's skin testing showed: Positive to grass, ragweed, weed, trees, mold, dust mites, cat, dog.  Today's spirometry showed: normal pattern with 9% and 210cc improvement in FEV1 post bronchodilator treatment. Clinically feeling improved.  Daily controller medication(s): start Breo 1 puff once a day and rinse mouth after each use. Demonstrated proper use.  During respiratory infections/flares:  Pretreat with albuterol 2 puffs. May use albuterol rescue inhaler 2 puffs every 4 to 6 hours as needed for shortness of breath, chest tightness, coughing, and wheezing. May use albuterol rescue inhaler 2 puffs 5 to 15 minutes prior to strenuous physical activities. Monitor frequency of use.  Get spirometry at next visit. If still having palpitations with albuterol will give levoalbuterol rx next.

## 2022-08-23 NOTE — Assessment & Plan Note (Signed)
Perioral discomfort/itching after eating certain walnuts. No issues with other tree nuts and peanuts. Today's skin testing showed: Borderline to almond and hazelnut.  Avoid tree nuts. Discussed that her food triggered oral and throat symptoms are likely caused by oral food allergy syndrome (OFAS). This is caused by cross reactivity of pollen with fresh fruits and vegetables, and nuts. Symptoms are usually localized in the form of itching and burning in mouth and throat. Very rarely it can progress to more severe symptoms. Eating foods in cooked or processed forms usually minimizes symptoms. I recommended avoidance of eating the problem foods, especially during the peak season(s). Sometimes, OFAS can induce severe throat swelling or even a systemic reaction; with such instance, I advised them to report to a local ER.

## 2022-08-23 NOTE — Assessment & Plan Note (Signed)
.   See assessment and plan as above. 

## 2022-08-23 NOTE — Assessment & Plan Note (Signed)
>>  ASSESSMENT AND PLAN FOR ORAL ALLERGY SYNDROME, SUBSEQUENT ENCOUNTER WRITTEN ON 08/23/2022  2:42 PM BY Wyline Mood M, DO  Perioral discomfort/itching after eating certain walnuts. No issues with other tree nuts and peanuts. Today's skin testing showed: Borderline to almond and hazelnut.  Avoid tree nuts. Discussed that her food triggered oral and throat symptoms are likely caused by oral food allergy syndrome (OFAS). This is caused by cross reactivity of pollen with fresh fruits and vegetables, and nuts. Symptoms are usually localized in the form of itching and burning in mouth and throat. Very rarely it can progress to more severe symptoms. Eating foods in cooked or processed forms usually minimizes symptoms. I recommended avoidance of eating the problem foods, especially during the peak season(s). Sometimes, OFAS can induce severe throat swelling or even a systemic reaction; with such instance, I advised them to report to a local ER.

## 2022-08-23 NOTE — Patient Instructions (Addendum)
Today's skin testing showed: Positive to grass, ragweed, weed, trees, mold, dust mites, cat, dog.  Borderline to almond and hazelnut.   Results given.  Asthma Daily controller medication(s): start Breo 1 puff once a day and rinse mouth after each use. Demonstrated proper use.  During respiratory infections/flares:  Pretreat with albuterol 2 puffs. May use albuterol rescue inhaler 2 puffs every 4 to 6 hours as needed for shortness of breath, chest tightness, coughing, and wheezing. May use albuterol rescue inhaler 2 puffs 5 to 15 minutes prior to strenuous physical activities. Monitor frequency of use.  Breathing control goals:  Full participation in all desired activities (may need albuterol before activity) Albuterol use two times or less a week on average (not counting use with activity) Cough interfering with sleep two times or less a month Oral steroids no more than once a year No hospitalizations   Environmental allergies Start environmental control measures as below. Start Singulair (montelukast) 10mg  daily at night. Cautioned that in some children/adults can experience behavioral changes including hyperactivity, agitation, depression, sleep disturbances and suicidal ideations. These side effects are rare, but if you notice them you should notify me and discontinue Singulair (montelukast). Use over the counter antihistamines such as Zyrtec (cetirizine), Claritin (loratadine), Allegra (fexofenadine), or Xyzal (levocetirizine) daily as needed. May take twice a day during allergy flares. May switch antihistamines every few months. Use Flonase (fluticasone) nasal spray 2 sprays per nostril once a day as needed for nasal congestion.  Use olopatadine eye drops 0.7% once a day as needed for itchy/watery eyes. Wait 10-15 min before putting contact lens in. Consider allergy injections for long term control if above medications do not help the symptoms - handout given.  Asthma must be  in better control before starting.   Food Avoid tree nuts. Discussed that her food triggered oral and throat symptoms are likely caused by oral food allergy syndrome (OFAS). This is caused by cross reactivity of pollen with fresh fruits and vegetables, and nuts. Symptoms are usually localized in the form of itching and burning in mouth and throat. Very rarely it can progress to more severe symptoms. Eating foods in cooked or processed forms usually minimizes symptoms. I recommended avoidance of eating the problem foods, especially during the peak season(s). Sometimes, OFAS can induce severe throat swelling or even a systemic reaction; with such instance, I advised them to report to a local ER.  Heartburn: See handout for lifestyle and dietary modifications.  Follow up in 2 months or sooner if needed.    Reducing Pollen Exposure Pollen seasons: trees (spring), grass (summer) and ragweed/weeds (fall). Keep windows closed in your home and car to lower pollen exposure.  Install air conditioning in the bedroom and throughout the house if possible.  Avoid going out in dry windy days - especially early morning. Pollen counts are highest between 5 - 10 AM and on dry, hot and windy days.  Save outside activities for late afternoon or after a heavy rain, when pollen levels are lower.  Avoid mowing of grass if you have grass pollen allergy. Be aware that pollen can also be transported indoors on people and pets.  Dry your clothes in an automatic dryer rather than hanging them outside where they might collect pollen.  Rinse hair and eyes before bedtime. Mold Control Mold and fungi can grow on a variety of surfaces provided certain temperature and moisture conditions exist.  Outdoor molds grow on plants, decaying vegetation and soil. The major outdoor mold,  Alternaria and Cladosporium, are found in very high numbers during hot and dry conditions. Generally, a late summer - fall peak is seen for common  outdoor fungal spores. Rain will temporarily lower outdoor mold spore count, but counts rise rapidly when the rainy period ends. The most important indoor molds are Aspergillus and Penicillium. Dark, humid and poorly ventilated basements are ideal sites for mold growth. The next most common sites of mold growth are the bathroom and the kitchen. Outdoor (Seasonal) Mold Control Use air conditioning and keep windows closed. Avoid exposure to decaying vegetation. Avoid leaf raking. Avoid grain handling. Consider wearing a face mask if working in moldy areas.  Indoor (Perennial) Mold Control  Maintain humidity below 50%. Get rid of mold growth on hard surfaces with water, detergent and, if necessary, 5% bleach (do not mix with other cleaners). Then dry the area completely. If mold covers an area more than 10 square feet, consider hiring an indoor environmental professional. For clothing, washing with soap and water is best. If moldy items cannot be cleaned and dried, throw them away. Remove sources e.g. contaminated carpets. Repair and seal leaking roofs or pipes. Using dehumidifiers in damp basements may be helpful, but empty the water and clean units regularly to prevent mildew from forming. All rooms, especially basements, bathrooms and kitchens, require ventilation and cleaning to deter mold and mildew growth. Avoid carpeting on concrete or damp floors, and storing items in damp areas. Control of House Dust Mite Allergen Dust mite allergens are a common trigger of allergy and asthma symptoms. While they can be found throughout the house, these microscopic creatures thrive in warm, humid environments such as bedding, upholstered furniture and carpeting. Because so much time is spent in the bedroom, it is essential to reduce mite levels there.  Encase pillows, mattresses, and box springs in special allergen-proof fabric covers or airtight, zippered plastic covers.  Bedding should be washed weekly in  hot water (130 F) and dried in a hot dryer. Allergen-proof covers are available for comforters and pillows that can't be regularly washed.  Wash the allergy-proof covers every few months. Minimize clutter in the bedroom. Keep pets out of the bedroom.  Keep humidity less than 50% by using a dehumidifier or air conditioning. You can buy a humidity measuring device called a hygrometer to monitor this.  If possible, replace carpets with hardwood, linoleum, or washable area rugs. If that's not possible, vacuum frequently with a vacuum that has a HEPA filter. Remove all upholstered furniture and non-washable window drapes from the bedroom. Remove all non-washable stuffed toys from the bedroom.  Wash stuffed toys weekly. Pet Allergen Avoidance: Contrary to popular opinion, there are no "hypoallergenic" breeds of dogs or cats. That is because people are not allergic to an animal's hair, but to an allergen found in the animal's saliva, dander (dead skin flakes) or urine. Pet allergy symptoms typically occur within minutes. For some people, symptoms can build up and become most severe 8 to 12 hours after contact with the animal. People with severe allergies can experience reactions in public places if dander has been transported on the pet owners' clothing. Keeping an animal outdoors is only a partial solution, since homes with pets in the yard still have higher concentrations of animal allergens. Before getting a pet, ask your allergist to determine if you are allergic to animals. If your pet is already considered part of your family, try to minimize contact and keep the pet out of the bedroom and  other rooms where you spend a great deal of time. As with dust mites, vacuum carpets often or replace carpet with a hardwood floor, tile or linoleum. High-efficiency particulate air (HEPA) cleaners can reduce allergen levels over time. While dander and saliva are the source of cat and dog allergens, urine is the source  of allergens from rabbits, hamsters, mice and [place] pigs; so ask a non-allergic family member to clean the animal's cage. If you have a pet allergy, talk to your allergist about the potential for allergy immunotherapy (allergy shots). This strategy can often provide long-term relief.

## 2022-08-23 NOTE — Assessment & Plan Note (Signed)
>>  ASSESSMENT AND PLAN FOR OTHER ALLERGIC RHINITIS WRITTEN ON 08/23/2022  2:41 PM BY Ellamae Sia, DO  Perennial rhinoconjunctivitis symptoms which flare in the spring and fall.  Tried Claritin and Zyrtec with minimal benefit.  No prior allergy evaluation.  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  Start environmental control measures as below. Start Singulair (montelukast) 10mg  daily at night. Cautioned that in some children/adults can experience behavioral changes including hyperactivity, agitation, depression, sleep disturbances and suicidal ideations. These side effects are rare, but if you notice them you should notify me and discontinue Singulair (montelukast). Use over the counter antihistamines such as Zyrtec (cetirizine), Claritin (loratadine), Allegra (fexofenadine), or Xyzal (levocetirizine) daily as needed. May take twice a day during allergy flares. May switch antihistamines every few months. Use Flonase (fluticasone) nasal spray 2 sprays per nostril once a day as needed for nasal congestion.  Use olopatadine eye drops 0.7% once a day as needed for itchy/watery eyes. Wait 10-15 min before putting contact lens in. Consider allergy injections for long term control if above medications do not help the symptoms - handout given.  Asthma must be in better control before starting.

## 2022-08-23 NOTE — Assessment & Plan Note (Signed)
Perennial rhinoconjunctivitis symptoms which flare in the spring and fall.  Tried Claritin and Zyrtec with minimal benefit.  No prior allergy evaluation.  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  Start environmental control measures as below. Start Singulair (montelukast)  daily at night. Cautioned that in some children/adults can experience behavioral changes including hyperactivity, agitation, depression, sleep disturbances and suicidal ideations. These side effects are rare, but if you notice them you should notify me and discontinue Singulair (montelukast). Use over the counter antihistamines such as Zyrtec (cetirizine), Claritin (loratadine), Allegra (fexofenadine), or Xyzal (levocetirizine) daily as needed. May take twice a day during allergy flares. May switch antihistamines every few months. Use Flonase (fluticasone) nasal spray 2 sprays per nostril once a day as needed for nasal congestion.  Use olopatadine eye drops 0.7% once a day as needed for itchy/watery eyes. Wait 10-15 min before putting contact lens in. Consider allergy injections for long term control if above medications do not help the symptoms - handout given.  Asthma must be in better control before starting.

## 2022-08-30 DIAGNOSIS — Z419 Encounter for procedure for purposes other than remedying health state, unspecified: Secondary | ICD-10-CM | POA: Diagnosis not present

## 2022-09-30 DIAGNOSIS — Z419 Encounter for procedure for purposes other than remedying health state, unspecified: Secondary | ICD-10-CM | POA: Diagnosis not present

## 2022-10-30 DIAGNOSIS — Z419 Encounter for procedure for purposes other than remedying health state, unspecified: Secondary | ICD-10-CM | POA: Diagnosis not present

## 2022-11-06 ENCOUNTER — Ambulatory Visit: Payer: Medicaid Other | Admitting: Allergy

## 2022-11-06 NOTE — Progress Notes (Deleted)
Follow Up Note  RE: Andrea Foster MRN: [id number] DOB: 1990-05-16 Date of Office Visit: 11/06/2022  Referring provider: Tollie Eth, NP Primary care provider: Tollie Eth, NP  Chief Complaint: No chief complaint on file.  History of Present Illness: I had the pleasure of seeing [name] for a follow up visit at the [facility] on 11/06/2022. She is a 32 y.o. female, who is being followed for asthma, allergic rhinoconjunctivitis, oral allergy syndrome and GERD. Her previous allergy office visit was on 08/23/2022 with Dr. Selena Batten. Today is a regular follow up visit.  Moderate persistent asthma without complication Diagnosed with asthma 20+ years ago and flared 5 years ago. Tried Flovent, Pulmicort and Advair as a child. Currently has symptoms about once per week and worse with weather changes. Albuterol in the past caused palpitations. Some reflux. Today's skin testing showed: Positive to grass, ragweed, weed, trees, mold, dust mites, cat, dog.  Today's spirometry showed: normal pattern with 9% and 210cc improvement in FEV1 post bronchodilator treatment. Clinically feeling improved.  Daily controller medication(s): start Breo 1 puff once a day and rinse mouth after each use. Demonstrated proper use.  During respiratory infections/flares:  Pretreat with albuterol 2 puffs. May use albuterol rescue inhaler 2 puffs every 4 to 6 hours as needed for shortness of breath, chest tightness, coughing, and wheezing. May use albuterol rescue inhaler 2 puffs 5 to 15 minutes prior to strenuous physical activities. Monitor frequency of use.  Get spirometry at next visit. If still having palpitations with albuterol will give levoalbuterol rx next.    Other allergic rhinitis Perennial rhinoconjunctivitis symptoms which flare in the spring and fall.  Tried Claritin and Zyrtec with minimal benefit.  No prior allergy evaluation.  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  Start environmental control measures as below. Start Singulair (montelukast) 10mg  daily at night. Cautioned that in some children/adults can experience behavioral changes including hyperactivity, agitation, depression, sleep disturbances and suicidal ideations. These side effects are rare, but if you notice them you should notify me and discontinue Singulair (montelukast). Use over the counter antihistamines such as Zyrtec (cetirizine), Claritin (loratadine), Allegra (fexofenadine), or Xyzal (levocetirizine) daily as needed. May take twice a day during allergy flares. May switch antihistamines every few months. Use Flonase (fluticasone) nasal spray 2 sprays per nostril once a day as needed for nasal congestion.  Use olopatadine eye drops 0.7% once a day as needed for itchy/watery eyes. Wait 10-15 min before putting contact lens in. Consider allergy injections for long term control if above medications do not help the symptoms - handout given.  Asthma must be in better control before starting.    Allergic conjunctivitis of both eyes See assessment and plan as above.   Oral allergy syndrome, subsequent encounter Perioral discomfort/itching after eating certain walnuts. No issues with other tree nuts and peanuts. Today's skin testing showed: Borderline to almond and hazelnut.  Avoid tree nuts. Discussed that her food triggered oral and throat symptoms are likely caused by oral food allergy syndrome (OFAS). This is caused by cross reactivity of pollen with fresh fruits and vegetables, and nuts. Symptoms are usually localized in the form of itching and burning in mouth and throat. Very rarely it can progress to more severe symptoms. Eating foods in cooked or processed forms usually minimizes symptoms. I recommended avoidance of eating the problem foods, especially  during the peak season(s). Sometimes, OFAS can induce  severe throat swelling or even a systemic reaction; with such instance, I advised them to report to a local ER.   Gastroesophageal reflux disease See handout for lifestyle and dietary modifications.   Return in about 2 months (around 10/23/2022).  Assessment and Plan: Andrea Foster is a 32 y.o. female with: No problem-specific Assessment & Plan notes found for this encounter.  No follow-ups on file.  No orders of the defined types were placed in this encounter.  Lab Orders  No laboratory test(s) ordered today    Diagnostics: Spirometry:  Tracings reviewed. Her effort: {Blank single:19197::"Good reproducible efforts.","It was hard to get consistent efforts and there is a question as to whether this reflects a maximal maneuver.","Poor effort, data can not be interpreted."} FVC: ***L FEV1: ***L, ***% predicted FEV1/FVC ratio: ***% Interpretation: {Blank single:19197::"Spirometry consistent with mild obstructive disease","Spirometry consistent with moderate obstructive disease","Spirometry consistent with severe obstructive disease","Spirometry consistent with possible restrictive disease","Spirometry consistent with mixed obstructive and restrictive disease","Spirometry uninterpretable due to technique","Spirometry consistent with normal pattern","No overt abnormalities noted given today's efforts"}.  Please see scanned spirometry results for details.  Skin Testing: {Blank single:19197::"Select foods","Environmental allergy panel","Environmental allergy panel and select foods","Food allergy panel","None","Deferred due to recent antihistamines use"}. *** Results discussed with patient/family.   Medication List:  Current Outpatient Medications  Medication Sig Dispense Refill   albuterol (VENTOLIN HFA) 108 (90 Base) MCG/ACT inhaler Inhale 2 puffs into the lungs every 4 (four) hours as needed for wheezing or shortness of breath (coughing fits). 18 g 1   Cholecalciferol (VITAMIN D3) 1.25 MG  (50000 UT) TABS Take 1 tablet by mouth once a week. 12 tablet 1   fluticasone (FLONASE) 50 MCG/ACT nasal spray Place 2 sprays into both nostrils daily as needed (nasal congestion). 16 g 3   fluticasone furoate-vilanterol (BREO ELLIPTA) 100-25 MCG/ACT AEPB Inhale 1 puff into the lungs daily. Rinse mouth after each use. 60 each 3   montelukast (SINGULAIR) 10 MG tablet Take 1 tablet (10 mg total) by mouth at bedtime. 30 tablet 3   Olopatadine HCl 0.7 % SOLN Apply 1 drop to eye daily as needed (itchy/watery eyes). 2.5 mL 3   No current facility-administered medications for this visit.   Allergies: Allergies  Allergen Reactions   Latex Hives   Other Anaphylaxis    walnuts   I reviewed her past medical history, social history, family history, and environmental history and no significant changes have been reported from her previous visit.  Review of Systems  Constitutional:  Negative for appetite change, chills, fever and unexpected weight change.  HENT:  Positive for congestion, rhinorrhea and sneezing.   Eyes:  Positive for itching.  Respiratory:  Positive for cough, chest tightness, shortness of breath and wheezing.   Cardiovascular:  Negative for chest pain.  Gastrointestinal:  Negative for abdominal pain.  Genitourinary:  Negative for difficulty urinating.  Skin:  Positive for rash.  Allergic/Immunologic: Positive for environmental allergies.  Neurological:  Negative for headaches.    Objective: There were no vitals taken for this visit. There is no height or weight on file to calculate BMI. Physical Exam Vitals and nursing note reviewed.  Constitutional:      Appearance: Normal appearance. She is well-developed.  HENT:     Head: Normocephalic and atraumatic.     Right Ear: Tympanic membrane and external ear normal.     Left Ear: Tympanic membrane and external ear normal.     Nose: Nose normal.  Mouth/Throat:     Mouth: Mucous membranes are moist.     Pharynx: Oropharynx  is clear.  Eyes:     Conjunctiva/sclera: Conjunctivae normal.  Cardiovascular:     Rate and Rhythm: Normal rate and regular rhythm.     Heart sounds: Normal heart sounds. No murmur heard.    No friction rub. No gallop.  Pulmonary:     Effort: Pulmonary effort is normal.     Breath sounds: Normal breath sounds. No wheezing, rhonchi or rales.  Musculoskeletal:     Cervical back: Neck supple.  Skin:    General: Skin is warm.     Findings: No rash.  Neurological:     Mental Status: She is alert and oriented to person, place, and time.  Psychiatric:        Behavior: Behavior normal.    Previous notes and tests were reviewed. The plan was reviewed with the patient/family, and all questions/concerned were addressed.  It was my pleasure to see Kadidia today and participate in her care. Please feel free to contact me with any questions or concerns.  Sincerely,  Wyline Mood, DO Allergy & Immunology  [facility] office: [phone] [facility] office: [phone]

## 2022-11-08 ENCOUNTER — Encounter: Payer: Self-pay | Admitting: Allergy

## 2022-11-08 ENCOUNTER — Ambulatory Visit (INDEPENDENT_AMBULATORY_CARE_PROVIDER_SITE_OTHER): Payer: Medicaid Other | Admitting: Allergy

## 2022-11-08 ENCOUNTER — Other Ambulatory Visit: Payer: Self-pay

## 2022-11-08 VITALS — BP 108/80 | HR 94 | Temp 98.6°F | Ht 64.57 in | Wt 153.9 lb

## 2022-11-08 DIAGNOSIS — K219 Gastro-esophageal reflux disease without esophagitis: Secondary | ICD-10-CM

## 2022-11-08 DIAGNOSIS — T781XXD Other adverse food reactions, not elsewhere classified, subsequent encounter: Secondary | ICD-10-CM | POA: Diagnosis not present

## 2022-11-08 DIAGNOSIS — H1013 Acute atopic conjunctivitis, bilateral: Secondary | ICD-10-CM | POA: Diagnosis not present

## 2022-11-08 DIAGNOSIS — J4540 Moderate persistent asthma, uncomplicated: Secondary | ICD-10-CM

## 2022-11-08 DIAGNOSIS — J3089 Other allergic rhinitis: Secondary | ICD-10-CM

## 2022-11-08 DIAGNOSIS — J302 Other seasonal allergic rhinitis: Secondary | ICD-10-CM | POA: Diagnosis not present

## 2022-11-08 DIAGNOSIS — H1010 Acute atopic conjunctivitis, unspecified eye: Secondary | ICD-10-CM

## 2022-11-08 DIAGNOSIS — J45998 Other asthma: Secondary | ICD-10-CM | POA: Diagnosis not present

## 2022-11-08 MED ORDER — MOMETASONE FURO-FORMOTEROL FUM 100-5 MCG/ACT IN AERO: 2.0000 | INHALATION_SPRAY | Freq: Two times a day (BID) | RESPIRATORY_TRACT | 3 refills | Status: DC

## 2022-11-08 MED ORDER — EPINEPHRINE 0.3 MG/0.3ML IJ SOAJ
0.3000 mg | INTRAMUSCULAR | 1 refills | Status: DC | PRN
Start: 2022-11-08 — End: 2024-03-06

## 2022-11-08 NOTE — Assessment & Plan Note (Signed)
Better. Continue lifestyle and dietary modifications.

## 2022-11-08 NOTE — Assessment & Plan Note (Signed)
>>  ASSESSMENT AND PLAN FOR ORAL ALLERGY SYNDROME, SUBSEQUENT ENCOUNTER WRITTEN ON 11/08/2022  5:00 PM BY Ellamae Sia, DO  Past history - perioral discomfort/itching after eating certain walnuts. No issues with other tree nuts and peanuts. 2024 skin testing showed: Borderline to almond and hazelnut.  Avoid tree nuts. 2024 skin testing showed: Borderline to almond and hazelnut.  Discussed that her food triggered oral and throat symptoms are likely caused by oral food allergy syndrome (OFAS). This is caused by cross reactivity of pollen with fresh fruits and vegetables, and nuts. Symptoms are usually localized in the form of itching and burning in mouth and throat. Very rarely it can progress to more severe symptoms. Eating foods in cooked or processed forms usually minimizes symptoms. I recommended avoidance of eating the problem foods, especially during the peak season(s). Sometimes, OFAS can induce severe throat swelling or even a systemic reaction; with such instance, I advised them to report to a local ER.

## 2022-11-08 NOTE — Assessment & Plan Note (Addendum)
Past history - diagnosed with asthma 20+ years ago and flared 5 years ago. Tried Flovent, Pulmicort and Advair as a child. Currently has symptoms about once per week and worse with weather changes. Albuterol in the past caused palpitations. Some reflux. 2024 skin testing showed: Positive to grass, ragweed, weed, trees, mold, dust mites, cat, dog. 2024 spirometry showed: normal pattern with 9% and 210cc improvement in FEV1 post bronchodilator treatment. Clinically feeling improved.  Interim history - never picked up Breo and using albuterol 1 puff once a day. Used Singulair up until 2 weeks ago as it was causing facial rash. Breathing is doing better. Today's spirometry was normal.  Daily controller medication(s): start Dulera 2 puffs once a day and rinse mouth after each use.  Let me know if not covered.  Spacer given and demonstrated proper use with inhaler. Patient understood technique and all questions/concerned were addressed.  During respiratory infections/flares:  Increase Dulera 2 puffs to TWICE a day. Pretreat with albuterol 2 puffs. May use albuterol rescue inhaler 2 puffs every 4 to 6 hours as needed for shortness of breath, chest tightness, coughing, and wheezing. May use albuterol rescue inhaler 2 puffs 5 to 15 minutes prior to strenuous physical activities. Monitor frequency of use.  Get spirometry at next visit.

## 2022-11-08 NOTE — Progress Notes (Signed)
Follow Up Note  RE: LOUSIE Foster MRN: [id number] DOB: [birth year] Date of Office Visit: 11/08/2022  Referring provider: Tollie Eth, NP Primary care provider: Tollie Eth, NP  Chief Complaint: Follow-up (C/o montelukast breaks her face out stop taking 2 week ago )  History of Present Illness: I had the pleasure of seeing Andrea Foster for a follow up visit at the [facility] on 11/08/2022. She is a 32 y.o. female, who is being followed for asthma, allergic rhinoconjunctivitis, oral allergy syndrome and GERD. Her previous allergy office visit was on 08/23/2022 with Dr. Selena Batten. Today is a regular follow up visit.  Moderate persistent asthma Patient never received Breo. Currently using albuterol 1 puff daily.   Denies any SOB, coughing, wheezing, chest tightness, nocturnal awakenings, ER/urgent care visits or prednisone use since the last visit.  Allergic rhinitis Stopped Singulair 2 weeks ago as it was breaking out her face. Currently taking Xyzal daily, Flonase 2 spray per nostril daily. Only using eye drops as needed 1-2 times per day.  Interested in starting AIT.    Gastroesophageal reflux disease Stable.  Assessment and Plan: Kaylenn is a 32 y.o. female with: Moderate persistent asthma without complication Past history - diagnosed with asthma 20+ years ago and flared 5 years ago. Tried Flovent, Pulmicort and Advair as a child. Currently has symptoms about once per week and worse with weather changes. Albuterol in the past caused palpitations. Some reflux. 2024 skin testing showed: Positive to grass, ragweed, weed, trees, mold, dust mites, cat, dog. 2024 spirometry showed: normal pattern with 9% and 210cc improvement in FEV1 post bronchodilator treatment. Clinically feeling improved.  Interim history - never picked up Breo and using albuterol 1 puff once a day. Used Singulair up until 2 weeks ago as it was causing facial rash. Breathing is doing  better. Today's spirometry was normal.  Daily controller medication(s): start Dulera 2 puffs once a day and rinse mouth after each use.  Let me know if not covered.  Spacer given and demonstrated proper use with inhaler. Patient understood technique and all questions/concerned were addressed.  During respiratory infections/flares:  Increase Dulera 2 puffs to TWICE a day. Pretreat with albuterol 2 puffs. May use albuterol rescue inhaler 2 puffs every 4 to 6 hours as needed for shortness of breath, chest tightness, coughing, and wheezing. May use albuterol rescue inhaler 2 puffs 5 to 15 minutes prior to strenuous physical activities. Monitor frequency of use.  Get spirometry at next visit.  Seasonal and perennial allergic rhinoconjunctivitis Past history - perennial rhinoconjunctivitis symptoms which flare in the spring and fall.  Tried Claritin and Zyrtec with minimal benefit.  Patient works as a Museum/gallery curator. 2024 skin testing showed: Positive to grass, ragweed, weed, trees, mold, dust mites, cat, dog.  Interim history - Singulair caused facial rash but it was helping.  Continue environmental control measures as below. Use over the counter antihistamines such as Zyrtec (cetirizine), Claritin (loratadine), Allegra (fexofenadine), or Xyzal (levocetirizine) daily as needed. May take twice a day during allergy flares. May switch antihistamines every few months. Use Flonase (fluticasone) nasal spray 2 sprays per nostril once a day as needed for nasal congestion.  Use olopatadine eye drops 0.7% once a day as needed for itchy/watery eyes. Wait 10-15 min before putting contact lens in. Start allergy injections. Had a detailed discussion with patient/family that clinical history is suggestive of allergic rhinitis, and may benefit from allergy immunotherapy (AIT). Discussed in  detail regarding the dosing, schedule, side effects (mild to moderate local allergic reaction and rarely systemic  allergic reactions including anaphylaxis), and benefits (significant improvement in nasal symptoms, seasonal flares of asthma) of immunotherapy with the patient. There is significant time commitment involved with allergy shots, which includes weekly immunotherapy injections for first 9-12 months and then biweekly to monthly injections for 3-5 years. Consent was signed. I have prescribed epinephrine injectable and demonstrated proper use. For mild symptoms you can take over the counter antihistamines such as Benadryl and monitor symptoms closely. If symptoms worsen or if you have severe symptoms including breathing issues, throat closure, significant swelling, whole body hives, severe diarrhea and vomiting, lightheadedness then inject epinephrine and seek immediate medical care afterwards. Emergency action plan given.  Oral allergy syndrome, subsequent encounter Past history - perioral discomfort/itching after eating certain walnuts. No issues with other tree nuts and peanuts. 2024 skin testing showed: Borderline to almond and hazelnut.  Avoid tree nuts. 2024 skin testing showed: Borderline to almond and hazelnut.  Discussed that her food triggered oral and throat symptoms are likely caused by oral food allergy syndrome (OFAS). This is caused by cross reactivity of pollen with fresh fruits and vegetables, and nuts. Symptoms are usually localized in the form of itching and burning in mouth and throat. Very rarely it can progress to more severe symptoms. Eating foods in cooked or processed forms usually minimizes symptoms. I recommended avoidance of eating the problem foods, especially during the peak season(s). Sometimes, OFAS can induce severe throat swelling or even a systemic reaction; with such instance, I advised them to report to a local ER.  Gastroesophageal reflux disease Better. Continue lifestyle and dietary modifications.  Return in about 3 months (around 02/08/2023).  Meds ordered this  encounter  Medications   mometasone-formoterol (DULERA) 100-5 MCG/ACT AERO    Sig: Inhale 2 puffs into the lungs in the morning and at bedtime. Rinse mouth after each use.    Dispense:  1 each    Refill:  3   EPINEPHrine 0.3 mg/0.3 mL IJ SOAJ injection    Sig: Inject 0.3 mg into the muscle as needed for anaphylaxis.    Dispense:  2 each    Refill:  1    May dispense generic/Mylan/Teva brand.   Lab Orders  No laboratory test(s) ordered today    Diagnostics: Spirometry:  Tracings reviewed. Her effort: Good reproducible efforts. FVC: 3.20L FEV1: 2.49L, 89% predicted FEV1/FVC ratio: 78% Interpretation: Spirometry consistent with normal pattern.  Please see scanned spirometry results for details.  Medication List:  Current Outpatient Medications  Medication Sig Dispense Refill   albuterol (VENTOLIN HFA) 108 (90 Base) MCG/ACT inhaler Inhale 2 puffs into the lungs every 4 (four) hours as needed for wheezing or shortness of breath (coughing fits). 18 g 1   EPINEPHrine 0.3 mg/0.3 mL IJ SOAJ injection Inject 0.3 mg into the muscle as needed for anaphylaxis. 2 each 1   fluticasone (FLONASE) 50 MCG/ACT nasal spray Place 2 sprays into both nostrils daily as needed (nasal congestion). 16 g 3   mometasone-formoterol (DULERA) 100-5 MCG/ACT AERO Inhale 2 puffs into the lungs in the morning and at bedtime. Rinse mouth after each use. 1 each 3   Olopatadine HCl 0.7 % SOLN Apply 1 drop to eye daily as needed (itchy/watery eyes). 2.5 mL 3   Cholecalciferol (VITAMIN D3) 1.25 MG (50000 UT) TABS Take 1 tablet by mouth once a week. (Patient not taking: Reported on 11/08/2022) 12 tablet 1  No current facility-administered medications for this visit.   Allergies: Allergies  Allergen Reactions   Latex Hives   Other Anaphylaxis    walnuts   Singulair [Montelukast]     Rash?   I reviewed her past medical history, social history, family history, and environmental history and no significant changes  have been reported from her previous visit.  Review of Systems  Constitutional:  Negative for appetite change, chills, fever and unexpected weight change.  HENT:  Negative for congestion and rhinorrhea.   Eyes:  Negative for itching.  Respiratory:  Negative for cough, chest tightness, shortness of breath and wheezing.   Cardiovascular:  Negative for chest pain.  Gastrointestinal:  Negative for abdominal pain.  Genitourinary:  Negative for difficulty urinating.  Skin:  Negative for rash.  Allergic/Immunologic: Positive for environmental allergies.  Neurological:  Negative for headaches.    Objective: BP 108/80   Pulse 94   Temp 98.6 F (37 C)   Ht 5' 4.57" (1.64 m)   Wt 153 lb 14.4 oz (69.8 kg)   SpO2 95%   BMI 25.95 kg/m  Body mass index is 25.95 kg/m. Physical Exam Vitals and nursing note reviewed.  Constitutional:      Appearance: Normal appearance. She is well-developed.  HENT:     Head: Normocephalic and atraumatic.     Right Ear: Tympanic membrane and external ear normal.     Left Ear: Tympanic membrane and external ear normal.     Nose: Nose normal.     Mouth/Throat:     Mouth: Mucous membranes are moist.     Pharynx: Oropharynx is clear.  Eyes:     Conjunctiva/sclera: Conjunctivae normal.  Cardiovascular:     Rate and Rhythm: Normal rate and regular rhythm.     Heart sounds: Normal heart sounds. No murmur heard.    No friction rub. No gallop.  Pulmonary:     Effort: Pulmonary effort is normal.     Breath sounds: Normal breath sounds. No wheezing, rhonchi or rales.  Musculoskeletal:     Cervical back: Neck supple.  Skin:    General: Skin is warm.     Findings: No rash.  Neurological:     Mental Status: She is alert and oriented to person, place, and time.  Psychiatric:        Behavior: Behavior normal.   Previous notes and tests were reviewed. The plan was reviewed with the patient/family, and all questions/concerned were addressed.  It was my  pleasure to see Rebacca today and participate in her care. Please feel free to contact me with any questions or concerns.  Sincerely,  Wyline Mood, DO Allergy & Immunology  [facility] office: [phone] [facility] office: [phone]

## 2022-11-08 NOTE — Patient Instructions (Addendum)
Asthma Daily controller medication(s): start Dulera 2 puffs once a day and rinse mouth after each use.  Let me know if not covered.  Spacer given and demonstrated proper use with inhaler. Patient understood technique and all questions/concerned were addressed.   During respiratory infections/flares:  Increase Dulera 2 puffs to TWICE a day. Pretreat with albuterol 2 puffs. May use albuterol rescue inhaler 2 puffs every 4 to 6 hours as needed for shortness of breath, chest tightness, coughing, and wheezing. May use albuterol rescue inhaler 2 puffs 5 to 15 minutes prior to strenuous physical activities. Monitor frequency of use.  Breathing control goals:  Full participation in all desired activities (may need albuterol before activity) Albuterol use two times or less a week on average (not counting use with activity) Cough interfering with sleep two times or less a month Oral steroids no more than once a year No hospitalizations   Environmental allergies 2024 skin testing showed: Positive to grass, ragweed, weed, trees, mold, dust mites, cat, dog Continue environmental control measures as below. Use over the counter antihistamines such as Zyrtec (cetirizine), Claritin (loratadine), Allegra (fexofenadine), or Xyzal (levocetirizine) daily as needed. May take twice a day during allergy flares. May switch antihistamines every few months. Use Flonase (fluticasone) nasal spray 2 sprays per nostril once a day as needed for nasal congestion.  Use olopatadine eye drops 0.7% once a day as needed for itchy/watery eyes. Wait 10-15 min before putting contact lens in. Start allergy injections. Had a detailed discussion with patient/family that clinical history is suggestive of allergic rhinitis, and may benefit from allergy immunotherapy (AIT). Discussed in detail regarding the dosing, schedule, side effects (mild to moderate local allergic reaction and rarely systemic allergic reactions  including anaphylaxis), and benefits (significant improvement in nasal symptoms, seasonal flares of asthma) of immunotherapy with the patient. There is significant time commitment involved with allergy shots, which includes weekly immunotherapy injections for first 9-12 months and then biweekly to monthly injections for 3-5 years. Consent was signed. I have prescribed epinephrine injectable and demonstrated proper use. For mild symptoms you can take over the counter antihistamines such as Benadryl and monitor symptoms closely. If symptoms worsen or if you have severe symptoms including breathing issues, throat closure, significant swelling, whole body hives, severe diarrhea and vomiting, lightheadedness then inject epinephrine and seek immediate medical care afterwards. Emergency action plan given.  Food Avoid tree nuts. 2024 skin testing showed: Borderline to almond and hazelnut.  Discussed that her food triggered oral and throat symptoms are likely caused by oral food allergy syndrome (OFAS). This is caused by cross reactivity of pollen with fresh fruits and vegetables, and nuts. Symptoms are usually localized in the form of itching and burning in mouth and throat. Very rarely it can progress to more severe symptoms. Eating foods in cooked or processed forms usually minimizes symptoms. I recommended avoidance of eating the problem foods, especially during the peak season(s). Sometimes, OFAS can induce severe throat swelling or even a systemic reaction; with such instance, I advised them to report to a local ER.  Heartburn: Continue lifestyle and dietary modifications.  Follow up in 3-4 months or sooner if needed.    Reducing Pollen Exposure Pollen seasons: trees (spring), grass (summer) and ragweed/weeds (fall). Keep windows closed in your home and car to lower pollen exposure.  Install air conditioning in the bedroom and throughout the house if possible.  Avoid going out in dry windy days -  especially early morning. Pollen counts  are highest between 5 - 10 AM and on dry, hot and windy days.  Save outside activities for late afternoon or after a heavy rain, when pollen levels are lower.  Avoid mowing of grass if you have grass pollen allergy. Be aware that pollen can also be transported indoors on people and pets.  Dry your clothes in an automatic dryer rather than hanging them outside where they might collect pollen.  Rinse hair and eyes before bedtime. Mold Control Mold and fungi can grow on a variety of surfaces provided certain temperature and moisture conditions exist.  Outdoor molds grow on plants, decaying vegetation and soil. The major outdoor mold, Alternaria and Cladosporium, are found in very high numbers during hot and dry conditions. Generally, a late summer - fall peak is seen for common outdoor fungal spores. Rain will temporarily lower outdoor mold spore count, but counts rise rapidly when the rainy period ends. The most important indoor molds are Aspergillus and Penicillium. Dark, humid and poorly ventilated basements are ideal sites for mold growth. The next most common sites of mold growth are the bathroom and the kitchen. Outdoor (Seasonal) Mold Control Use air conditioning and keep windows closed. Avoid exposure to decaying vegetation. Avoid leaf raking. Avoid grain handling. Consider wearing a face mask if working in moldy areas.  Indoor (Perennial) Mold Control  Maintain humidity below 50%. Get rid of mold growth on hard surfaces with water, detergent and, if necessary, 5% bleach (do not mix with other cleaners). Then dry the area completely. If mold covers an area more than 10 square feet, consider hiring an indoor environmental professional. For clothing, washing with soap and water is best. If moldy items cannot be cleaned and dried, throw them away. Remove sources e.g. contaminated carpets. Repair and seal leaking roofs or pipes. Using dehumidifiers in  damp basements may be helpful, but empty the water and clean units regularly to prevent mildew from forming. All rooms, especially basements, bathrooms and kitchens, require ventilation and cleaning to deter mold and mildew growth. Avoid carpeting on concrete or damp floors, and storing items in damp areas. Control of House Dust Mite Allergen Dust mite allergens are a common trigger of allergy and asthma symptoms. While they can be found throughout the house, these microscopic creatures thrive in warm, humid environments such as bedding, upholstered furniture and carpeting. Because so much time is spent in the bedroom, it is essential to reduce mite levels there.  Encase pillows, mattresses, and box springs in special allergen-proof fabric covers or airtight, zippered plastic covers.  Bedding should be washed weekly in hot water (130 F) and dried in a hot dryer. Allergen-proof covers are available for comforters and pillows that can't be regularly washed.  Wash the allergy-proof covers every few months. Minimize clutter in the bedroom. Keep pets out of the bedroom.  Keep humidity less than 50% by using a dehumidifier or air conditioning. You can buy a humidity measuring device called a hygrometer to monitor this.  If possible, replace carpets with hardwood, linoleum, or washable area rugs. If that's not possible, vacuum frequently with a vacuum that has a HEPA filter. Remove all upholstered furniture and non-washable window drapes from the bedroom. Remove all non-washable stuffed toys from the bedroom.  Wash stuffed toys weekly. Pet Allergen Avoidance: Contrary to popular opinion, there are no "hypoallergenic" breeds of dogs or cats. That is because people are not allergic to an animal's hair, but to an allergen found in the animal's saliva, dander (dead  skin flakes) or urine. Pet allergy symptoms typically occur within minutes. For some people, symptoms can build up and become most severe 8 to 12 hours  after contact with the animal. People with severe allergies can experience reactions in public places if dander has been transported on the pet owners' clothing. Keeping an animal outdoors is only a partial solution, since homes with pets in the yard still have higher concentrations of animal allergens. Before getting a pet, ask your allergist to determine if you are allergic to animals. If your pet is already considered part of your family, try to minimize contact and keep the pet out of the bedroom and other rooms where you spend a great deal of time. As with dust mites, vacuum carpets often or replace carpet with a hardwood floor, tile or linoleum. High-efficiency particulate air (HEPA) cleaners can reduce allergen levels over time. While dander and saliva are the source of cat and dog allergens, urine is the source of allergens from rabbits, hamsters, mice and [place] pigs; so ask a non-allergic family member to clean the animal's cage. If you have a pet allergy, talk to your allergist about the potential for allergy immunotherapy (allergy shots). This strategy can often provide long-term relief.

## 2022-11-08 NOTE — Assessment & Plan Note (Signed)
Past history - perioral discomfort/itching after eating certain walnuts. No issues with other tree nuts and peanuts. 2024 skin testing showed: Borderline to almond and hazelnut.  Avoid tree nuts. 2024 skin testing showed: Borderline to almond and hazelnut.  Discussed that her food triggered oral and throat symptoms are likely caused by oral food allergy syndrome (OFAS). This is caused by cross reactivity of pollen with fresh fruits and vegetables, and nuts. Symptoms are usually localized in the form of itching and burning in mouth and throat. Very rarely it can progress to more severe symptoms. Eating foods in cooked or processed forms usually minimizes symptoms. I recommended avoidance of eating the problem foods, especially during the peak season(s). Sometimes, OFAS can induce severe throat swelling or even a systemic reaction; with such instance, I advised them to report to a local ER.

## 2022-11-08 NOTE — Assessment & Plan Note (Signed)
Past history - perennial rhinoconjunctivitis symptoms which flare in the spring and fall.  Tried Claritin and Zyrtec with minimal benefit.  Patient works as a Museum/gallery curator. 2024 skin testing showed: Positive to grass, ragweed, weed, trees, mold, dust mites, cat, dog.  Interim history - Singulair caused facial rash but it was helping.  Continue environmental control measures as below. Use over the counter antihistamines such as Zyrtec (cetirizine), Claritin (loratadine), Allegra (fexofenadine), or Xyzal (levocetirizine) daily as needed. May take twice a day during allergy flares. May switch antihistamines every few months. Use Flonase (fluticasone) nasal spray 2 sprays per nostril once a day as needed for nasal congestion.  Use olopatadine eye drops 0.7% once a day as needed for itchy/watery eyes. Wait 10-15 min before putting contact lens in. Start allergy injections. Had a detailed discussion with patient/family that clinical history is suggestive of allergic rhinitis, and may benefit from allergy immunotherapy (AIT). Discussed in detail regarding the dosing, schedule, side effects (mild to moderate local allergic reaction and rarely systemic allergic reactions including anaphylaxis), and benefits (significant improvement in nasal symptoms, seasonal flares of asthma) of immunotherapy with the patient. There is significant time commitment involved with allergy shots, which includes weekly immunotherapy injections for first 9-12 months and then biweekly to monthly injections for 3-5 years. Consent was signed. I have prescribed epinephrine injectable and demonstrated proper use. For mild symptoms you can take over the counter antihistamines such as Benadryl and monitor symptoms closely. If symptoms worsen or if you have severe symptoms including breathing issues, throat closure, significant swelling, whole body hives, severe diarrhea and vomiting, lightheadedness then inject epinephrine and seek immediate  medical care afterwards. Emergency action plan given.

## 2022-11-09 NOTE — Progress Notes (Signed)
VIALS NOT MADE UNTIL APPT IS SCHED 

## 2022-11-09 NOTE — Progress Notes (Signed)
Aeroallergen Immunotherapy  Ordering Provider: Dr. Wyline Mood  Patient Details Name: Andrea Foster MRN: [id number] Date of Birth: 1990/11/20  Order 1 of 2  Vial Label: [id number]  0.3 ml (Volume)  BAU Concentration -- 7 Grass Mix* 100,000 ([address] [place], [place], [place], [place], [place], Sweet Vernal, Timothy) 0.3 ml (Volume)  BAU Concentration -- [place] 10,000 0.2 ml (Volume)  1:20 Concentration -- [place] 0.3 ml (Volume)  1:20 Concentration -- Ragweed Mix 0.5 ml (Volume)  1:20 Concentration -- Weed Mix* 0.5 ml (Volume)  1:20 Concentration -- Eastern 10 Tree Mix (also Sweet Gum) 0.2 ml (Volume)  1:20 Concentration -- Box Elder 0.2 ml (Volume)  1:10 Concentration -- Cedar, red 0.2 ml (Volume)  1:20 Concentration -- Walnut, Black Pollen   2.7  ml Extract Subtotal 2.3  ml Diluent 5.0  ml Maintenance Total  Schedule:  B Blue Vial (1:100,000): Schedule B (6 doses) Yellow Vial (1:10,000): Schedule B (6 doses) Green Vial (1:1,000): Schedule B (6 doses) Red Vial (1:100): Schedule A (14 doses)  Special Instructions: once per week build up.

## 2022-11-09 NOTE — Progress Notes (Signed)
Aeroallergen Immunotherapy   Ordering Provider: Dr. Wyline Mood   Patient Details  Name: PAIDYN MCFERRAN  MRN: [id number]  Date of Birth: 08-15-1990   Order 2 of 2   Vial Label: M-[id number]   0.2 ml (Volume)  1:20 Concentration -- Alternaria alternata  0.2 ml (Volume)  1:10 Concentration -- Aspergillus mix  0.2 ml (Volume)  1:10 Concentration -- Penicillium mix  0.2 ml (Volume)  1:20 Concentration -- Drechslera spicifera  0.2 ml (Volume)  1:10 Concentration -- Fusarium moniliforme  0.2 ml (Volume)  1:10 Concentration -- Rhizopus oryzae  0.2 ml (Volume)  1:40 Concentration -- Phoma betae  0.5 ml (Volume)  1:10 Concentration -- Cat Hair  0.5 ml (Volume)  1:10 Concentration -- Dog Epithelia  0.5 ml (Volume)   AU Concentration -- Mite Mix (DF 5,000 & DP 5,000)    2.9  ml Extract Subtotal  2.1  ml Diluent  5.0  ml Maintenance Total   Schedule:  B  Blue Vial (1:100,000): Schedule B (6 doses)  Yellow Vial (1:10,000): Schedule B (6 doses)  Green Vial (1:1,000): Schedule B (6 doses)  Red Vial (1:100): Schedule A (14 doses)   Special Instructions: once per week build up.

## 2022-11-15 DIAGNOSIS — J301 Allergic rhinitis due to pollen: Secondary | ICD-10-CM | POA: Diagnosis not present

## 2022-11-15 NOTE — Progress Notes (Signed)
VIALS EXP 11-15-23

## 2022-11-16 DIAGNOSIS — J3081 Allergic rhinitis due to animal (cat) (dog) hair and dander: Secondary | ICD-10-CM | POA: Diagnosis not present

## 2022-11-18 ENCOUNTER — Encounter: Payer: Self-pay | Admitting: Allergy

## 2022-11-21 NOTE — Telephone Encounter (Signed)
Called [facility] - spoke to [place], Pharmacist - DOB verified - advised of patient notation above. Revonda Standard stated she did receive Dulera prescription; Ventolin, Fluticasone and Olopatadine have refills.   Responded to patient's mychart message with above notation.

## 2022-11-30 ENCOUNTER — Ambulatory Visit (INDEPENDENT_AMBULATORY_CARE_PROVIDER_SITE_OTHER): Payer: Medicaid Other

## 2022-11-30 DIAGNOSIS — J309 Allergic rhinitis, unspecified: Secondary | ICD-10-CM | POA: Diagnosis not present

## 2022-11-30 DIAGNOSIS — Z419 Encounter for procedure for purposes other than remedying health state, unspecified: Secondary | ICD-10-CM | POA: Diagnosis not present

## 2022-11-30 NOTE — Progress Notes (Signed)
Immunotherapy   Patient Details  Name: Andrea Foster MRN: [id number] Date of Birth: 08-24-1990  11/30/2022  Andrea Foster started injections for  dogs, cats, dust mites, molds, grasses, ragweed's, trees, and weeds.  Following schedule: B  Frequency:1 time per week Epi-Pen:Epi-Pen Available  Consent signed previously and patient instructions given. Patient sat in room twenty seven for thirty minutes without an issue.   Ralene Muskrat 11/30/2022, 2:49 PM

## 2022-12-08 ENCOUNTER — Ambulatory Visit (INDEPENDENT_AMBULATORY_CARE_PROVIDER_SITE_OTHER): Payer: Medicaid Other

## 2022-12-08 DIAGNOSIS — J309 Allergic rhinitis, unspecified: Secondary | ICD-10-CM

## 2022-12-13 DIAGNOSIS — H5213 Myopia, bilateral: Secondary | ICD-10-CM | POA: Diagnosis not present

## 2022-12-14 ENCOUNTER — Ambulatory Visit (INDEPENDENT_AMBULATORY_CARE_PROVIDER_SITE_OTHER): Payer: Medicaid Other | Admitting: *Deleted

## 2022-12-14 DIAGNOSIS — J309 Allergic rhinitis, unspecified: Secondary | ICD-10-CM

## 2022-12-31 DIAGNOSIS — Z419 Encounter for procedure for purposes other than remedying health state, unspecified: Secondary | ICD-10-CM | POA: Diagnosis not present

## 2023-01-04 ENCOUNTER — Ambulatory Visit (INDEPENDENT_AMBULATORY_CARE_PROVIDER_SITE_OTHER): Payer: Medicaid Other

## 2023-01-04 DIAGNOSIS — J309 Allergic rhinitis, unspecified: Secondary | ICD-10-CM | POA: Diagnosis not present

## 2023-01-10 ENCOUNTER — Ambulatory Visit (INDEPENDENT_AMBULATORY_CARE_PROVIDER_SITE_OTHER): Payer: Self-pay | Admitting: *Deleted

## 2023-01-10 DIAGNOSIS — J309 Allergic rhinitis, unspecified: Secondary | ICD-10-CM

## 2023-01-18 DIAGNOSIS — Z113 Encounter for screening for infections with a predominantly sexual mode of transmission: Secondary | ICD-10-CM | POA: Diagnosis not present

## 2023-01-18 DIAGNOSIS — Z0000 Encounter for general adult medical examination without abnormal findings: Secondary | ICD-10-CM | POA: Diagnosis not present

## 2023-01-19 ENCOUNTER — Ambulatory Visit (INDEPENDENT_AMBULATORY_CARE_PROVIDER_SITE_OTHER): Payer: Medicaid Other

## 2023-01-19 DIAGNOSIS — J309 Allergic rhinitis, unspecified: Secondary | ICD-10-CM

## 2023-01-30 ENCOUNTER — Ambulatory Visit (INDEPENDENT_AMBULATORY_CARE_PROVIDER_SITE_OTHER): Payer: Medicaid Other | Admitting: *Deleted

## 2023-01-30 DIAGNOSIS — Z419 Encounter for procedure for purposes other than remedying health state, unspecified: Secondary | ICD-10-CM | POA: Diagnosis not present

## 2023-01-30 DIAGNOSIS — J309 Allergic rhinitis, unspecified: Secondary | ICD-10-CM

## 2023-02-14 ENCOUNTER — Ambulatory Visit (INDEPENDENT_AMBULATORY_CARE_PROVIDER_SITE_OTHER): Payer: Self-pay | Admitting: *Deleted

## 2023-02-14 DIAGNOSIS — J309 Allergic rhinitis, unspecified: Secondary | ICD-10-CM

## 2023-03-01 ENCOUNTER — Ambulatory Visit: Payer: Medicaid Other | Admitting: Nurse Practitioner

## 2023-03-01 ENCOUNTER — Encounter: Payer: Self-pay | Admitting: Nurse Practitioner

## 2023-03-01 VITALS — BP 122/82 | HR 60 | Wt 158.6 lb

## 2023-03-01 DIAGNOSIS — F411 Generalized anxiety disorder: Secondary | ICD-10-CM

## 2023-03-01 DIAGNOSIS — G4489 Other headache syndrome: Secondary | ICD-10-CM

## 2023-03-01 DIAGNOSIS — K2900 Acute gastritis without bleeding: Secondary | ICD-10-CM | POA: Diagnosis not present

## 2023-03-01 DIAGNOSIS — K581 Irritable bowel syndrome with constipation: Secondary | ICD-10-CM

## 2023-03-01 MED ORDER — PANTOPRAZOLE SODIUM 40 MG PO TBEC: 40.0000 mg | DELAYED_RELEASE_TABLET | Freq: Every day | ORAL | 3 refills | Status: DC

## 2023-03-01 NOTE — Patient Instructions (Signed)
Please let me know if you would like to start a medication for the anxiety and I will be happy to send that in.   Irritable Bowel Syndrome, Adult  Irritable bowel syndrome (IBS) is a group of symptoms that affects the organs responsible for digestion (gastrointestinal tract, or GI tract). IBS is not one specific disease. To regulate how the GI tract works, the body sends signals back and forth between the intestines and the brain. If you have IBS, there may be a problem with these signals. As a result, the GI tract does not function normally. The intestines may become more sensitive and overreact to certain things. This may be especially true when you eat certain foods or when you are under stress. There are four main types of IBS. These may be determined based on the consistency of your stool (feces): IBS with mostly (predominance of) diarrhea. IBS with predominance of constipation. IBS with mixed bowel habits. This includes both diarrhea and constipation. IBS unclassified. This includes IBS that cannot be categorized into one of the other three main types. It is important to know which type of IBS you have. Certain treatments are more likely to be helpful for certain types of IBS. What are the causes? The exact cause of IBS is not known. What increases the risk? You may have a higher risk for IBS if you: Are female. Are younger than 40 years. Have a family history of IBS. Have a mental health condition, such as depression, anxiety, or post-traumatic stress disorder. Have had a bacterial infection of your GI tract. What are the signs or symptoms? Symptoms of IBS vary from person to person. The main symptom is abdominal pain or discomfort. Other symptoms usually include one or more of the following: Diarrhea, constipation, or both. Swelling or bloating in the abdomen. Feeling full after eating a small or regular-sized meal. Frequent gas. Mucus in the stool. A feeling of having more stool  left after a bowel movement. Symptoms tend to come and go. They may be triggered by stress, mental health conditions, or certain foods. How is this diagnosed? This condition may be diagnosed based on a physical exam, your medical history, and your symptoms. You may have tests, such as: Blood tests. Stool test. Colonoscopy. This is a procedure in which your GI tract is viewed with a long, thin, flexible tube. How is this treated? There is no cure for IBS, but treatment can help relieve symptoms. Treatment depends on the type of IBS you have, and may include: Changes to your diet, such as: Avoiding foods that cause symptoms. Drinking more water. Following a low-FODMAP (fermentable oligosaccharides, disaccharides, monosaccharides, and polyols) diet for up to 6 weeks, or as told by your health care provider. FODMAPs are sugars that are hard for some people to digest. Eating more fiber. Eating small meals at the same times every day. Medicines. These may include: Fiber supplements, if you have constipation. Medicine to control diarrhea (antidiarrheal medicines). Medicine to help control muscle tightening (spasms) in your GI tract (antispasmodic medicines). Medicines to help with mental health conditions, such as antidepressants. Talk therapy or counseling. Working with a dietitian to help create a food plan that is right for you. Managing your stress. Follow these instructions at home: Eating and drinking  Eat a healthy diet. Eat 5-6 small meals a day. Try to eat meals at about the same times each day. Do not eat large meals. Gradually eat more fiber-rich foods. These include whole grains, fruits, and  vegetables. This may be especially helpful if you have IBS with constipation. Eat a diet low in FODMAPs. You may need to avoid foods such as citrus fruits, cabbage, garlic, and onions. Drink enough fluid to keep your urine pale yellow. Keep a journal of foods that seem to trigger  symptoms. Avoid foods and drinks that: Contain added sugar. Make your symptoms worse. These may include dairy products, caffeinated drinks, and carbonated drinks. Alcohol use Do not drink alcohol if: Your health care provider tells you not to drink. You are pregnant, may be pregnant, or are planning to become pregnant. If you drink alcohol: Limit how much you have to: 0-1 drink a day for women. 0-2 drinks a day for men. Know how much alcohol is in your drink. In the [place] one drink equals one 12 oz bottle of beer (355 mL), one 5 oz glass of wine (148 mL), or one 1 oz glass of hard liquor (44 mL) General instructions Take over-the-counter and prescription medicines only as told by your health care provider. This includes supplements. Get enough exercise. Do at least 150 minutes of moderate-intensity exercise each week. Manage your stress. Getting enough sleep and exercise can help you manage stress. Keep all follow-up visits. This is important. This includes all visits with your health care provider and therapist. Where to find more information International [employer] for Functional Gastrointestinal Disorders: aboutibs.[employer] of Diabetes and Digestive and Kidney Diseases: [employer] Contact a health care provider if: You have constant pain. You lose weight. You have diarrhea that gets worse. You have bleeding from the rectum. You vomit often. You have a fever. Get help right away if: You have severe abdominal pain. You have diarrhea with symptoms of dehydration, such as dizziness or dry mouth. You have bloody or black stools. You have severe abdominal bloating. You have vomiting that does not stop. You have blood in your vomit. Summary Irritable bowel syndrome (IBS) is not one specific disease. It is a group of symptoms that affects digestion. Your intestines may become more sensitive and overreact to certain things. This may be especially true when you eat  certain foods or when you are under stress. There is no cure for IBS, but treatment can help relieve symptoms. This information is not intended to replace advice given to you by your health care provider. Make sure you discuss any questions you have with your health care provider. Document Revised: 03/30/2021 Document Reviewed: 03/30/2021 Elsevier Patient Education  2024 [employer].

## 2023-03-01 NOTE — Assessment & Plan Note (Signed)
Reports of inconsistent bowel movements and sensation of incomplete emptying. Likely related to stress and potentially exacerbated by acute gastritis. -Increase dietary fiber intake. -Consider use of Miralax as needed for constipation. -If symptoms persist or worsen, patient to contact the clinic for further evaluation and management.

## 2023-03-01 NOTE — Assessment & Plan Note (Signed)
Reports of headaches starting at the back of the neck and moving forward. Likely related to stress. Reports of headaches occurring around the time of menstrual cycle. -Try over-the-counter Excedrin Migraine for symptomatic relief. -If ineffective, patient to contact the clinic for potential prescription of Maxalt.

## 2023-03-01 NOTE — Progress Notes (Signed)
Tollie Eth, DNP, AGNP-c [facility] [address] [place], [place] [address] [phone]   ACUTE VISIT- ESTABLISHED PATIENT  Blood pressure 122/82, pulse 60, weight 158 lb 9.6 oz (71.9 kg), last menstrual period 02/15/2023.  Subjective:  HPI Andrea Foster is a 32 y.o. female presents to day for evaluation of acute concern(s).  Abdominal Pain  History of Present Illness Andrea Foster presents with a six to eight-week history of increasing anxiety and recent onset of abdominal pain. The anxiety has been significant enough to prompt the patient to seek therapy, although she has not yet started. The patient denies any specific triggers for the anxiety but notes it has been severe enough to cause distress at work and home. She feels the anxiety in her abdomen and is concerned this has triggered the ongoing upper abdominal pain.   The abdominal pain is localized to the epigastric region and has been present for several days. The pain was initially associated with a sensation of anxiety in the stomach, but has since localized to the upper abdomen. The patient describes the pain as severe and associated with nausea, particularly after eating. Over-the-counter Mylanta provided minimal relief. The patient also reports a sensation of incomplete bowel movements, with changes in stool consistency from soft to normal.  The patient also reports a recent increase in headaches, which are new for her. The headaches are typically frontal but have also been occipital. The patient notes that these headaches often occur around the time of her menstrual cycle, but recently have been occurring outside of this pattern. Over-the-counter medications have been used for symptomatic relief.  The patient denies any changes in medications or new medications, aside from a short course of opioids following dental surgery, which she discontinued due to unpleasant side effects. The patient has been managing  her symptoms with over-the-counter medications and lifestyle modifications while awaiting further medical and therapeutic intervention.  ROS negative except for what is listed in HPI. History, Medications, Surgery, SDOH, and Family History reviewed and updated as appropriate.  Objective:  Physical Exam Vitals and nursing note reviewed.  Constitutional:      Appearance: Normal appearance.  HENT:     Head: Normocephalic.  Eyes:     Pupils: Pupils are equal, round, and reactive to light.  Cardiovascular:     Rate and Rhythm: Normal rate and regular rhythm.     Pulses: Normal pulses.     Heart sounds: Normal heart sounds.  Pulmonary:     Effort: Pulmonary effort is normal.     Breath sounds: Normal breath sounds.  Abdominal:     General: Abdomen is flat. Bowel sounds are normal. There is no distension.     Palpations: Abdomen is soft. There is no mass.     Tenderness: There is abdominal tenderness in the epigastric area. There is no right CVA tenderness, left CVA tenderness, guarding or rebound.     Hernia: No hernia is present.  Musculoskeletal:        General: Normal range of motion.     Cervical back: Normal range of motion.     Right lower leg: No edema.     Left lower leg: No edema.  Skin:    General: Skin is warm.  Neurological:     General: No focal deficit present.     Mental Status: She is alert and oriented to person, place, and time.  Psychiatric:        Mood and Affect: Mood normal.  Assessment & Plan:   Problem List Items Addressed This Visit     Acute gastritis without hemorrhage - Primary    Epigastric pain, nausea, and bloating. Likely secondary to increased acid production possibly related to anxiety. No signs of severe complications such as melena. -Start Pantoprazole once daily for 30 days to reduce acid production and allow healing of potential gastric ulceration. -Continue use of Mylanta as needed for symptomatic relief. -If symptoms persist  after a week, patient to notify the clinic for potential adjustment of treatment plan.      Relevant Medications   pantoprazole (PROTONIX) 40 MG tablet   Generalized anxiety disorder    Reports of increased anxiety over the past 6-8 weeks. Currently seeking therapy. She wishes to avoid medication if possible.  -Continue with planned therapy. -If symptoms worsen or persist, patient to contact the clinic for potential pharmacological intervention.      Other headache syndrome    Reports of headaches starting at the back of the neck and moving forward. Likely related to stress. Reports of headaches occurring around the time of menstrual cycle. -Try over-the-counter Excedrin Migraine for symptomatic relief. -If ineffective, patient to contact the clinic for potential prescription of Maxalt.      Irritable bowel syndrome with constipation    Reports of inconsistent bowel movements and sensation of incomplete emptying. Likely related to stress and potentially exacerbated by acute gastritis. -Increase dietary fiber intake. -Consider use of Miralax as needed for constipation. -If symptoms persist or worsen, patient to contact the clinic for further evaluation and management.      Relevant Medications   pantoprazole (PROTONIX) 40 MG tablet      Tollie Eth, DNP, AGNP-c

## 2023-03-01 NOTE — Assessment & Plan Note (Signed)
Reports of increased anxiety over the past 6-8 weeks. Currently seeking therapy. She wishes to avoid medication if possible.  -Continue with planned therapy. -If symptoms worsen or persist, patient to contact the clinic for potential pharmacological intervention.

## 2023-03-01 NOTE — Assessment & Plan Note (Signed)
Epigastric pain, nausea, and bloating. Likely secondary to increased acid production possibly related to anxiety. No signs of severe complications such as melena. -Start Pantoprazole once daily for 30 days to reduce acid production and allow healing of potential gastric ulceration. -Continue use of Mylanta as needed for symptomatic relief. -If symptoms persist after a week, patient to notify the clinic for potential adjustment of treatment plan.

## 2023-03-02 DIAGNOSIS — Z419 Encounter for procedure for purposes other than remedying health state, unspecified: Secondary | ICD-10-CM | POA: Diagnosis not present

## 2023-03-11 NOTE — Progress Notes (Unsigned)
Follow Up Note  RE: Andrea Foster MRN: [id number] DOB: 1991/02/19 Date of Office Visit: 03/12/2023  Referring provider: Tollie Eth, NP Primary care provider: Tollie Eth, NP  Chief Complaint: No chief complaint on file.  History of Present Illness: I had the pleasure of seeing Andrea Foster for a follow up visit at the [facility] on 03/11/2023. She is a 32 y.o. female, who is being followed for asthma, allergic rhinoconjunctivitis on AIT, oral allergy syndrome and GERD. Her previous allergy office visit was on 11/08/2022 with Dr. Selena Batten. Today is a regular follow up visit.  Discussed the use of AI scribe software for clinical note transcription with the patient, who gave verbal consent to proceed.  History of Present Illness            Moderate persistent asthma without complication Past history - diagnosed with asthma 20+ years ago and flared 5 years ago. Tried Flovent, Pulmicort and Advair as a child. Currently has symptoms about once per week and worse with weather changes. Albuterol in the past caused palpitations. Some reflux. 2024 skin testing showed: Positive to grass, ragweed, weed, trees, mold, dust mites, cat, dog. 2024 spirometry showed: normal pattern with 9% and 210cc improvement in FEV1 post bronchodilator treatment. Clinically feeling improved.  Interim history - never picked up Breo and using albuterol 1 puff once a day. Used Singulair up until 2 weeks ago as it was causing facial rash. Breathing is doing better. Today's spirometry was normal.  Daily controller medication(s): start Dulera 2 puffs once a day and rinse mouth after each use.  Let me know if not covered.  Spacer given and demonstrated proper use with inhaler. Patient understood technique and all questions/concerned were addressed.  During respiratory infections/flares:  Increase Dulera 2 puffs to TWICE a day. Pretreat with albuterol 2 puffs. May use albuterol  rescue inhaler 2 puffs every 4 to 6 hours as needed for shortness of breath, chest tightness, coughing, and wheezing. May use albuterol rescue inhaler 2 puffs 5 to 15 minutes prior to strenuous physical activities. Monitor frequency of use.  Get spirometry at next visit.   Seasonal and perennial allergic rhinoconjunctivitis Past history - perennial rhinoconjunctivitis symptoms which flare in the spring and fall.  Tried Claritin and Zyrtec with minimal benefit.  Patient works as a Museum/gallery curator. 2024 skin testing showed: Positive to grass, ragweed, weed, trees, mold, dust mites, cat, dog.  Interim history - Singulair caused facial rash but it was helping.  Continue environmental control measures as below. Use over the counter antihistamines such as Zyrtec (cetirizine), Claritin (loratadine), Allegra (fexofenadine), or Xyzal (levocetirizine) daily as needed. May take twice a day during allergy flares. May switch antihistamines every few months. Use Flonase (fluticasone) nasal spray 2 sprays per nostril once a day as needed for nasal congestion.  Use olopatadine eye drops 0.7% once a day as needed for itchy/watery eyes. Wait 10-15 min before putting contact lens in. Start allergy injections. Had a detailed discussion with patient/family that clinical history is suggestive of allergic rhinitis, and may benefit from allergy immunotherapy (AIT). Discussed in detail regarding the dosing, schedule, side effects (mild to moderate local allergic reaction and rarely systemic allergic reactions including anaphylaxis), and benefits (significant improvement in nasal symptoms, seasonal flares of asthma) of immunotherapy with the patient. There is significant time commitment involved with allergy shots, which includes weekly immunotherapy injections for first 9-12 months and then biweekly to monthly injections for  3-5 years. Consent was signed. I have prescribed epinephrine injectable and demonstrated proper use. For  mild symptoms you can take over the counter antihistamines such as Benadryl and monitor symptoms closely. If symptoms worsen or if you have severe symptoms including breathing issues, throat closure, significant swelling, whole body hives, severe diarrhea and vomiting, lightheadedness then inject epinephrine and seek immediate medical care afterwards. Emergency action plan given.   Oral allergy syndrome, subsequent encounter Past history - perioral discomfort/itching after eating certain walnuts. No issues with other tree nuts and peanuts. 2024 skin testing showed: Borderline to almond and hazelnut.  Avoid tree nuts. 2024 skin testing showed: Borderline to almond and hazelnut.  Discussed that her food triggered oral and throat symptoms are likely caused by oral food allergy syndrome (OFAS). This is caused by cross reactivity of pollen with fresh fruits and vegetables, and nuts. Symptoms are usually localized in the form of itching and burning in mouth and throat. Very rarely it can progress to more severe symptoms. Eating foods in cooked or processed forms usually minimizes symptoms. I recommended avoidance of eating the problem foods, especially during the peak season(s). Sometimes, OFAS can induce severe throat swelling or even a systemic reaction; with such instance, I advised them to report to a local ER.   Gastroesophageal reflux disease Better. Continue lifestyle and dietary modifications.  Assessment and Plan: Caidence is a 32 y.o. female with: Moderate persistent asthma without complication Past history - diagnosed with asthma 20+ years ago and flared 5 years ago. Tried Flovent, Pulmicort and Advair as a child. Currently has symptoms once per week and worse with weather changes. Albuterol in the past caused palpitations. 2024 spirometry normal pattern with 9% and 210cc improvement in FEV1 post bronchodilator treatment. Clinically feeling improved.  Interim history -   Seasonal allergic  rhinitis due to pollen Allergic rhinitis due to mold Allergic rhinitis due to dust mite Allergic rhinitis due to animal dander Past history - perennial rhinoconjunctivitis symptoms which flare in the spring and fall.  Patient works as a Museum/gallery curator. 2024 skin testing positive to grass, ragweed, weed, trees, mold, dust mites, cat, dog. Singulair caused facial rash. Interim history - Start AIT on 11/30/2022 (MDmCD & GRWWT)  Oral allergy syndrome, subsequent encounter Past history - perioral discomfort/itching after eating certain walnuts. No issues with other tree nuts and peanuts. 2024 skin testing borderline to almond and hazelnut.   Gastroesophageal reflux disease, unspecified whether esophagitis present ***  Assessment and Plan              No follow-ups on file.  No orders of the defined types were placed in this encounter.  Lab Orders  No laboratory test(s) ordered today    Diagnostics: Spirometry:  Tracings reviewed. Her effort: {Blank single:19197::"Good reproducible efforts.","It was hard to get consistent efforts and there is a question as to whether this reflects a maximal maneuver.","Poor effort, data can not be interpreted."} FVC: ***L FEV1: ***L, ***% predicted FEV1/FVC ratio: ***% Interpretation: {Blank single:19197::"Spirometry consistent with mild obstructive disease","Spirometry consistent with moderate obstructive disease","Spirometry consistent with severe obstructive disease","Spirometry consistent with possible restrictive disease","Spirometry consistent with mixed obstructive and restrictive disease","Spirometry uninterpretable due to technique","Spirometry consistent with normal pattern","No overt abnormalities noted given today's efforts"}.  Please see scanned spirometry results for details.  Skin Testing: {Blank single:19197::"Select foods","Environmental allergy panel","Environmental allergy panel and select foods","Food allergy panel","None","Deferred  due to recent antihistamines use"}. *** Results discussed with patient/family.   Medication List:  Current Outpatient Medications  Medication  Sig Dispense Refill  . albuterol (VENTOLIN HFA) 108 (90 Base) MCG/ACT inhaler Inhale 2 puffs into the lungs every 4 (four) hours as needed for wheezing or shortness of breath (coughing fits). 18 g 1  . EPINEPHrine 0.3 mg/0.3 mL IJ SOAJ injection Inject 0.3 mg into the muscle as needed for anaphylaxis. 2 each 1  . fluticasone (FLONASE) 50 MCG/ACT nasal spray Place 2 sprays into both nostrils daily as needed (nasal congestion). 16 g 3  . mometasone-formoterol (DULERA) 100-5 MCG/ACT AERO Inhale 2 puffs into the lungs in the morning and at bedtime. Rinse mouth after each use. (Patient not taking: Reported on 03/01/2023) 1 each 3  . Olopatadine HCl 0.7 % SOLN Apply 1 drop to eye daily as needed (itchy/watery eyes). 2.5 mL 3  . pantoprazole (PROTONIX) 40 MG tablet Take 1 tablet (40 mg total) by mouth daily. 30 tablet 3   No current facility-administered medications for this visit.   Allergies: Allergies  Allergen Reactions  . Latex Hives  . Other Anaphylaxis    walnuts  . Singulair [Montelukast]     Rash?   I reviewed her past medical history, social history, family history, and environmental history and no significant changes have been reported from her previous visit.  Review of Systems  Constitutional:  Negative for appetite change, chills, fever and unexpected weight change.  HENT:  Negative for congestion and rhinorrhea.   Eyes:  Negative for itching.  Respiratory:  Negative for cough, chest tightness, shortness of breath and wheezing.   Cardiovascular:  Negative for chest pain.  Gastrointestinal:  Negative for abdominal pain.  Genitourinary:  Negative for difficulty urinating.  Skin:  Negative for rash.  Allergic/Immunologic: Positive for environmental allergies.  Neurological:  Negative for headaches.   Objective: LMP 02/15/2023   There is no height or weight on file to calculate BMI. Physical Exam Vitals and nursing note reviewed.  Constitutional:      Appearance: Normal appearance. She is well-developed.  HENT:     Head: Normocephalic and atraumatic.     Right Ear: Tympanic membrane and external ear normal.     Left Ear: Tympanic membrane and external ear normal.     Nose: Nose normal.     Mouth/Throat:     Mouth: Mucous membranes are moist.     Pharynx: Oropharynx is clear.  Eyes:     Conjunctiva/sclera: Conjunctivae normal.  Cardiovascular:     Rate and Rhythm: Normal rate and regular rhythm.     Heart sounds: Normal heart sounds. No murmur heard.    No friction rub. No gallop.  Pulmonary:     Effort: Pulmonary effort is normal.     Breath sounds: Normal breath sounds. No wheezing, rhonchi or rales.  Musculoskeletal:     Cervical back: Neck supple.  Skin:    General: Skin is warm.     Findings: No rash.  Neurological:     Mental Status: She is alert and oriented to person, place, and time.  Psychiatric:        Behavior: Behavior normal.  Previous notes and tests were reviewed. The plan was reviewed with the patient/family, and all questions/concerned were addressed.  It was my pleasure to see Jaleisa today and participate in her care. Please feel free to contact me with any questions or concerns.  Sincerely,  Wyline Mood, DO Allergy & Immunology  [facility] office: [phone] [facility] office: [phone]

## 2023-03-12 ENCOUNTER — Ambulatory Visit (INDEPENDENT_AMBULATORY_CARE_PROVIDER_SITE_OTHER): Payer: Medicaid Other | Admitting: Allergy

## 2023-03-12 ENCOUNTER — Other Ambulatory Visit: Payer: Self-pay

## 2023-03-12 ENCOUNTER — Encounter: Payer: Self-pay | Admitting: Allergy

## 2023-03-12 VITALS — BP 104/60 | HR 64 | Temp 97.4°F | Resp 14

## 2023-03-12 DIAGNOSIS — J4540 Moderate persistent asthma, uncomplicated: Secondary | ICD-10-CM

## 2023-03-12 DIAGNOSIS — K219 Gastro-esophageal reflux disease without esophagitis: Secondary | ICD-10-CM | POA: Diagnosis not present

## 2023-03-12 DIAGNOSIS — J3089 Other allergic rhinitis: Secondary | ICD-10-CM

## 2023-03-12 DIAGNOSIS — J301 Allergic rhinitis due to pollen: Secondary | ICD-10-CM

## 2023-03-12 DIAGNOSIS — T781XXD Other adverse food reactions, not elsewhere classified, subsequent encounter: Secondary | ICD-10-CM | POA: Diagnosis not present

## 2023-03-12 DIAGNOSIS — J3081 Allergic rhinitis due to animal (cat) (dog) hair and dander: Secondary | ICD-10-CM

## 2023-03-12 DIAGNOSIS — J309 Allergic rhinitis, unspecified: Secondary | ICD-10-CM

## 2023-03-12 MED ORDER — OLOPATADINE HCL 0.7 % OP SOLN: 1.0000 [drp] | Freq: Every day | OPHTHALMIC | 5 refills | Status: DC | PRN

## 2023-03-12 MED ORDER — FLUTICASONE PROPIONATE HFA 110 MCG/ACT IN AERO: 2.0000 | INHALATION_SPRAY | Freq: Two times a day (BID) | RESPIRATORY_TRACT | 3 refills | Status: DC | PRN

## 2023-03-12 NOTE — Patient Instructions (Addendum)
Asthma During respiratory infections/flares:  Start Flovent 2 puffs twice a day with spacer and rinse mouth afterwards for 1-2 weeks until your breathing symptoms return to baseline.  Pretreat with albuterol 2 puffs. May use levoalbuterol rescue inhaler 2 puffs every 4 to 6 hours as needed for shortness of breath, chest tightness, coughing, and wheezing. May use levoalbuterol rescue inhaler 2 puffs 5 to 15 minutes prior to strenuous physical activities. Monitor frequency of use - if you need to use it more than twice per week on a consistent basis let us know.  Breathing control goals:  Full participation in all desired activities (may need albuterol before activity) Albuterol use two times or less a week on average (not counting use with activity) Cough interfering with sleep two times or less a month Oral steroids no more than once a year No hospitalizations   Environmental allergies 2024 skin testing positive to grass, ragweed, weed, trees, mold, dust mites, cat, dog Continue environmental control measures as below. Use over the counter antihistamines such as Zyrtec (cetirizine), Claritin (loratadine), Allegra (fexofenadine), or Xyzal (levocetirizine) daily as needed. May take twice a day during allergy flares. May switch antihistamines every few months. Use Flonase (fluticasone) nasal spray 1-2 sprays per nostril once a day as needed for nasal congestion.  Nasal saline spray (i.e., Simply Saline) or nasal saline lavage (i.e., NeilMed) is recommended as needed and prior to medicated nasal sprays. Use olopatadine eye drops 0.7% once a day as needed for itchy/watery eyes. Wait 10-15 min before putting contact lens in. Continue allergy injections - given today.   Food Avoid tree nuts. 2024 skin testing showed: Borderline to almond and hazelnut.   Heartburn Continue lifestyle and dietary modifications.  Follow up in 4 months or sooner if needed.    Reducing Pollen  Exposure Pollen seasons: trees (spring), grass (summer) and ragweed/weeds (fall). Keep windows closed in your home and car to lower pollen exposure.  Install air conditioning in the bedroom and throughout the house if possible.  Avoid going out in dry windy days - especially early morning. Pollen counts are highest between 5 - 10 AM and on dry, hot and windy days.  Save outside activities for late afternoon or after a heavy rain, when pollen levels are lower.  Avoid mowing of grass if you have grass pollen allergy. Be aware that pollen can also be transported indoors on people and pets.  Dry your clothes in an automatic dryer rather than hanging them outside where they might collect pollen.  Rinse hair and eyes before bedtime. Mold Control Mold and fungi can grow on a variety of surfaces provided certain temperature and moisture conditions exist.  Outdoor molds grow on plants, decaying vegetation and soil. The major outdoor mold, Alternaria and Cladosporium, are found in very high numbers during hot and dry conditions. Generally, a late summer - fall peak is seen for common outdoor fungal spores. Rain will temporarily lower outdoor mold spore count, but counts rise rapidly when the rainy period ends. The most important indoor molds are Aspergillus and Penicillium. Dark, humid and poorly ventilated basements are ideal sites for mold growth. The next most common sites of mold growth are the bathroom and the kitchen. Outdoor (Seasonal) Mold Control Use air conditioning and keep windows closed. Avoid exposure to decaying vegetation. Avoid leaf raking. Avoid grain handling. Consider wearing a face mask if working in moldy areas.  Indoor (Perennial) Mold Control  Maintain humidity below 50%. Get rid of mold growth  on hard surfaces with water, detergent and, if necessary, 5% bleach (do not mix with other cleaners). Then dry the area completely. If mold covers an area more than 10 square feet,  consider hiring an indoor environmental professional. For clothing, washing with soap and water is best. If moldy items cannot be cleaned and dried, throw them away. Remove sources e.g. contaminated carpets. Repair and seal leaking roofs or pipes. Using dehumidifiers in damp basements may be helpful, but empty the water and clean units regularly to prevent mildew from forming. All rooms, especially basements, bathrooms and kitchens, require ventilation and cleaning to deter mold and mildew growth. Avoid carpeting on concrete or damp floors, and storing items in damp areas. Control of House Dust Mite Allergen Dust mite allergens are a common trigger of allergy and asthma symptoms. While they can be found throughout the house, these microscopic creatures thrive in warm, humid environments such as bedding, upholstered furniture and carpeting. Because so much time is spent in the bedroom, it is essential to reduce mite levels there.  Encase pillows, mattresses, and box springs in special allergen-proof fabric covers or airtight, zippered plastic covers.  Bedding should be washed weekly in hot water (130 F) and dried in a hot dryer. Allergen-proof covers are available for comforters and pillows that can't be regularly washed.  Wash the allergy-proof covers every few months. Minimize clutter in the bedroom. Keep pets out of the bedroom.  Keep humidity less than 50% by using a dehumidifier or air conditioning. You can buy a humidity measuring device called a hygrometer to monitor this.  If possible, replace carpets with hardwood, linoleum, or washable area rugs. If that's not possible, vacuum frequently with a vacuum that has a HEPA filter. Remove all upholstered furniture and non-washable window drapes from the bedroom. Remove all non-washable stuffed toys from the bedroom.  Wash stuffed toys weekly. Pet Allergen Avoidance: Contrary to popular opinion, there are no "hypoallergenic" breeds of dogs or cats.  That is because people are not allergic to an animal's hair, but to an allergen found in the animal's saliva, dander (dead skin flakes) or urine. Pet allergy symptoms typically occur within minutes. For some people, symptoms can build up and become most severe 8 to 12 hours after contact with the animal. People with severe allergies can experience reactions in public places if dander has been transported on the pet owners' clothing. Keeping an animal outdoors is only a partial solution, since homes with pets in the yard still have higher concentrations of animal allergens. Before getting a pet, ask your allergist to determine if you are allergic to animals. If your pet is already considered part of your family, try to minimize contact and keep the pet out of the bedroom and other rooms where you spend a great deal of time. As with dust mites, vacuum carpets often or replace carpet with a hardwood floor, tile or linoleum. High-efficiency particulate air (HEPA) cleaners can reduce allergen levels over time. While dander and saliva are the source of cat and dog allergens, urine is the source of allergens from rabbits, hamsters, mice and [place] pigs; so ask a non-allergic family member to clean the animal's cage. If you have a pet allergy, talk to your allergist about the potential for allergy immunotherapy (allergy shots). This strategy can often provide long-term relief.

## 2023-03-23 ENCOUNTER — Ambulatory Visit (INDEPENDENT_AMBULATORY_CARE_PROVIDER_SITE_OTHER): Payer: Medicaid Other

## 2023-03-23 DIAGNOSIS — J309 Allergic rhinitis, unspecified: Secondary | ICD-10-CM | POA: Diagnosis not present

## 2023-04-01 DIAGNOSIS — Z419 Encounter for procedure for purposes other than remedying health state, unspecified: Secondary | ICD-10-CM | POA: Diagnosis not present

## 2023-04-04 ENCOUNTER — Ambulatory Visit (INDEPENDENT_AMBULATORY_CARE_PROVIDER_SITE_OTHER): Payer: Medicaid Other

## 2023-04-04 DIAGNOSIS — J309 Allergic rhinitis, unspecified: Secondary | ICD-10-CM

## 2023-04-16 ENCOUNTER — Ambulatory Visit (INDEPENDENT_AMBULATORY_CARE_PROVIDER_SITE_OTHER): Payer: Self-pay | Admitting: *Deleted

## 2023-04-16 DIAGNOSIS — J309 Allergic rhinitis, unspecified: Secondary | ICD-10-CM | POA: Diagnosis not present

## 2023-05-02 DIAGNOSIS — Z419 Encounter for procedure for purposes other than remedying health state, unspecified: Secondary | ICD-10-CM | POA: Diagnosis not present

## 2023-05-03 ENCOUNTER — Ambulatory Visit (INDEPENDENT_AMBULATORY_CARE_PROVIDER_SITE_OTHER): Payer: Medicaid Other | Admitting: *Deleted

## 2023-05-03 DIAGNOSIS — J309 Allergic rhinitis, unspecified: Secondary | ICD-10-CM

## 2023-05-09 ENCOUNTER — Ambulatory Visit (INDEPENDENT_AMBULATORY_CARE_PROVIDER_SITE_OTHER): Payer: Medicaid Other

## 2023-05-09 DIAGNOSIS — J309 Allergic rhinitis, unspecified: Secondary | ICD-10-CM

## 2023-05-15 DIAGNOSIS — F4325 Adjustment disorder with mixed disturbance of emotions and conduct: Secondary | ICD-10-CM | POA: Diagnosis not present

## 2023-05-28 ENCOUNTER — Ambulatory Visit (INDEPENDENT_AMBULATORY_CARE_PROVIDER_SITE_OTHER): Payer: Self-pay | Admitting: *Deleted

## 2023-05-28 DIAGNOSIS — J309 Allergic rhinitis, unspecified: Secondary | ICD-10-CM

## 2023-06-02 DIAGNOSIS — Z419 Encounter for procedure for purposes other than remedying health state, unspecified: Secondary | ICD-10-CM | POA: Diagnosis not present

## 2023-06-07 ENCOUNTER — Ambulatory Visit (INDEPENDENT_AMBULATORY_CARE_PROVIDER_SITE_OTHER): Payer: Medicaid Other

## 2023-06-07 DIAGNOSIS — J309 Allergic rhinitis, unspecified: Secondary | ICD-10-CM | POA: Diagnosis not present

## 2023-06-19 DIAGNOSIS — F4325 Adjustment disorder with mixed disturbance of emotions and conduct: Secondary | ICD-10-CM | POA: Diagnosis not present

## 2023-06-22 ENCOUNTER — Ambulatory Visit (INDEPENDENT_AMBULATORY_CARE_PROVIDER_SITE_OTHER): Payer: Medicaid Other

## 2023-06-22 DIAGNOSIS — J309 Allergic rhinitis, unspecified: Secondary | ICD-10-CM | POA: Diagnosis not present

## 2023-06-30 DIAGNOSIS — Z419 Encounter for procedure for purposes other than remedying health state, unspecified: Secondary | ICD-10-CM | POA: Diagnosis not present

## 2023-07-03 ENCOUNTER — Ambulatory Visit (INDEPENDENT_AMBULATORY_CARE_PROVIDER_SITE_OTHER): Payer: Self-pay

## 2023-07-03 DIAGNOSIS — J309 Allergic rhinitis, unspecified: Secondary | ICD-10-CM

## 2023-07-06 NOTE — Progress Notes (Signed)
 Shawna Clamp, DNP, AGNP-c [facility] [address] [place], [place] [address] Main Office [phone]  BP 100/60   Pulse 60   Ht 5\' 5"  (1.651 m)   Wt 159 lb 6.4 oz (72.3 kg)   LMP 06/24/2023   BMI 26.53 kg/m    Subjective:    Patient ID: Andrea Foster, female    DOB: 03-02-1991, 33 y.o.   MRN: [id number]  HPI: Andrea Foster is a 33 y.o. female presenting on 07/09/2023 for comprehensive medical examination.   History of Present Illness The patient presents for an annual physical exam.  She is planning to undergo a tummy tuck at the end of July and has received paperwork for pre-operative testing. She is considering having labs done today to check her iron levels and other parameters to ensure everything is fine before the surgery.  Her asthma is currently controlled. She receives allergy shots twice a week and uses Albuterol and Flovent as needed but does not take them regularly unless she feels it is necessary. No shortness of breath, chest pain, or dizziness.  She experienced a recent episode of stomach pain, which she attributes to possible food poisoning from [employer]. She describes symptoms of feeling hot and cold, and frequent bathroom visits, which resolved by the next day. Her stomach has been doing better since then.  She has recently gotten braces and did not have them as a child. She anticipates wearing them for about two years.  Her periods are regular and not excessively heavy. She declined flu and COVID vaccines.  Pertinent items are noted in HPI.  IMMUNIZATIONS:   Flu Vaccine: Flu vaccine declined, patient does not wish to complete COVID: Declined today. Information on where to obtain the vaccine was provided.    HEALTH MAINTENANCE: Pap Smear HM Status: is up to date- She sees GYN at [facility] at [place].   Most Recent Depression Screen:     07/03/2022   10:21 AM 07/01/2020    2:15 PM 06/10/2020    2:30 PM 05/14/2020    12:06 PM 04/26/2020   11:00 AM  Depression screen PHQ 2/9  Decreased Interest 0 0 0 0 0  Down, Depressed, Hopeless 0 0 0 0 0  PHQ - 2 Score 0 0 0 0 0  Altered sleeping  0 0 0 0  Tired, decreased energy  0 0 0 0  Change in appetite  0 0 0 0  Feeling bad or failure about yourself   0 0 0 0  Trouble concentrating  0 0 0 0  Moving slowly or fidgety/restless  0 0 0 0  Suicidal thoughts  0 0 0 0  PHQ-9 Score  0 0 0 0   Most Recent Anxiety Screen:     07/01/2020    2:15 PM 06/10/2020    2:30 PM 05/14/2020   12:06 PM 04/26/2020   11:03 AM  GAD 7 : Generalized Anxiety Score  Nervous, Anxious, on Edge 0 0 0 0  Control/stop worrying 0 0 0 0  Worry too much - different things 0 0 0 0  Trouble relaxing 0 0 0 0  Restless 0 0 0 0  Easily annoyed or irritable 0 0 0 0  Afraid - awful might happen 0 0 0 0  Total GAD 7 Score 0 0 0 0   Most Recent Fall Screen:    07/09/2023    8:29 AM 07/03/2022   10:21 AM 04/10/2017    3:32 PM 03/20/2017  1:53 PM 02/20/2017    2:27 PM  Fall Risk   Falls in the past year? 0 0 No No No  Number falls in past yr: 0 0     Injury with Fall? 0 0     Risk for fall due to : No Fall Risks No Fall Risks     Follow up Falls evaluation completed Falls evaluation completed       Past medical history, surgical history, medications, allergies, family history and social history reviewed with patient today and changes made to appropriate areas of the chart.  Past Medical History:  Past Medical History:  Diagnosis Date   Allergic conjunctivitis of both eyes 08/23/2022   Allergy    Anemia    Asthma    seasonal as a child   Depression    Encounter for annual physical exam 07/03/2022   Gastroesophageal reflux disease 08/23/2022   Gonorrhea    Hx of chlamydia infection 2013   Medications:  Current Outpatient Medications on File Prior to Visit  Medication Sig   albuterol (VENTOLIN HFA) 108 (90 Base) MCG/ACT inhaler Inhale 2 puffs into the lungs every 4 (four) hours  as needed for wheezing or shortness of breath (coughing fits). (Patient not taking: Reported on 07/09/2023)   EPINEPHrine 0.3 mg/0.3 mL IJ SOAJ injection Inject 0.3 mg into the muscle as needed for anaphylaxis. (Patient not taking: Reported on 07/09/2023)   fluticasone (FLONASE) 50 MCG/ACT nasal spray Place 2 sprays into both nostrils daily as needed (nasal congestion). (Patient not taking: Reported on 07/09/2023)   fluticasone (FLOVENT HFA) 110 MCG/ACT inhaler Inhale 2 puffs into the lungs 2 (two) times daily as needed. Use during asthma flare for 1-2 weeks at a time. Make sure to rinse mouth after each use. (Patient not taking: Reported on 07/09/2023)   Olopatadine HCl 0.7 % SOLN Apply 1 drop to eye daily as needed (itchy/watery eyes). (Patient not taking: Reported on 07/09/2023)   No current facility-administered medications on file prior to visit.   Surgical History:  Past Surgical History:  Procedure Laterality Date   NO PAST SURGERIES     Allergies:  Allergies  Allergen Reactions   Latex Hives   Other Anaphylaxis    walnuts   Singulair [Montelukast]     Rash?   Family History:  Family History  Problem Relation Age of Onset   Allergic rhinitis Mother    Asthma Brother    Allergic rhinitis Maternal Grandmother    Cancer Maternal Grandfather        ? agent orange   Diabetes Neg Hx    Heart disease Neg Hx    Hypertension Neg Hx    Stroke Neg Hx        Objective:    BP 100/60   Pulse 60   Ht 5\' 5"  (1.651 m)   Wt 159 lb 6.4 oz (72.3 kg)   LMP 06/24/2023   BMI 26.53 kg/m   Wt Readings from Last 3 Encounters:  07/09/23 159 lb 6.4 oz (72.3 kg)  03/01/23 158 lb 9.6 oz (71.9 kg)  11/08/22 153 lb 14.4 oz (69.8 kg)    Physical Exam Vitals and nursing note reviewed.  Constitutional:      General: She is not in acute distress.    Appearance: Normal appearance.  HENT:     Head: Normocephalic and atraumatic.     Right Ear: Hearing, tympanic membrane, ear canal and external  ear normal.     Left Ear: Hearing,  tympanic membrane, ear canal and external ear normal.     Nose: Nose normal.     Right Sinus: No maxillary sinus tenderness or frontal sinus tenderness.     Left Sinus: No maxillary sinus tenderness or frontal sinus tenderness.     Mouth/Throat:     Lips: Pink.     Mouth: Mucous membranes are moist.     Pharynx: Oropharynx is clear.  Eyes:     General: Lids are normal. Vision grossly intact.     Extraocular Movements: Extraocular movements intact.     Conjunctiva/sclera: Conjunctivae normal.     Pupils: Pupils are equal, round, and reactive to light.     Funduscopic exam:    Right eye: Red reflex present.        Left eye: Red reflex present.    Visual Fields: Right eye visual fields normal and left eye visual fields normal.  Neck:     Thyroid: No thyromegaly.     Vascular: No carotid bruit.  Cardiovascular:     Rate and Rhythm: Normal rate and regular rhythm.     Chest Wall: PMI is not displaced.     Pulses: Normal pulses.          Dorsalis pedis pulses are 2+ on the right side and 2+ on the left side.       Posterior tibial pulses are 2+ on the right side and 2+ on the left side.     Heart sounds: Normal heart sounds. No murmur heard. Pulmonary:     Effort: Pulmonary effort is normal. No respiratory distress.     Breath sounds: Normal breath sounds.  Abdominal:     General: Abdomen is flat. Bowel sounds are normal. There is no distension.     Palpations: Abdomen is soft. There is no hepatomegaly, splenomegaly or mass.     Tenderness: There is no abdominal tenderness. There is no right CVA tenderness, left CVA tenderness, guarding or rebound.  Musculoskeletal:        General: Normal range of motion.     Cervical back: Full passive range of motion without pain, normal range of motion and neck supple. No tenderness.     Right lower leg: No edema.     Left lower leg: No edema.  Feet:     Left foot:     Toenail Condition: Left toenails are  normal.  Lymphadenopathy:     Cervical: No cervical adenopathy.     Upper Body:     Right upper body: No supraclavicular adenopathy.     Left upper body: No supraclavicular adenopathy.  Skin:    General: Skin is warm and dry.     Capillary Refill: Capillary refill takes less than 2 seconds.     Nails: There is no clubbing.  Neurological:     General: No focal deficit present.     Mental Status: She is alert and oriented to person, place, and time.     GCS: GCS eye subscore is 4. GCS verbal subscore is 5. GCS motor subscore is 6.     Sensory: Sensation is intact.     Motor: Motor function is intact.     Coordination: Coordination is intact.     Gait: Gait is intact.     Deep Tendon Reflexes: Reflexes are normal and symmetric.  Psychiatric:        Attention and Perception: Attention normal.        Mood and Affect: Mood normal.  Speech: Speech normal.        Behavior: Behavior normal. Behavior is cooperative.        Thought Content: Thought content normal.        Cognition and Memory: Cognition and memory normal.        Judgment: Judgment normal.      Results for orders placed or performed in visit on 07/03/22  CBC with Differential/Platelet   Collection Time: 07/03/22 11:12 AM  Result Value Ref Range   WBC 4.8 3.4 - 10.8 x10E3/uL   RBC 4.28 3.77 - 5.28 x10E6/uL   Hemoglobin 12.3 11.1 - 15.9 g/dL   Hematocrit 16.1 09.6 - 46.6 %   MCV 86 79 - 97 fL   MCH 28.7 26.6 - 33.0 pg   MCHC 33.6 31.5 - 35.7 g/dL   RDW 04.5 40.9 - 81.1 %   Platelets 268 150 - 450 x10E3/uL   Neutrophils 42 Not Estab. %   Lymphs 36 Not Estab. %   Monocytes 7 Not Estab. %   Eos 14 Not Estab. %   Basos 1 Not Estab. %   Neutrophils Absolute 2.1 1.4 - 7.0 x10E3/uL   Lymphocytes Absolute 1.8 0.7 - 3.1 x10E3/uL   Monocytes Absolute 0.3 0.1 - 0.9 x10E3/uL   EOS (ABSOLUTE) 0.7 (H) 0.0 - 0.4 x10E3/uL   Basophils Absolute 0.0 0.0 - 0.2 x10E3/uL   Immature Granulocytes 0 Not Estab. %   Immature Grans  (Abs) 0.0 0.0 - 0.1 x10E3/uL  Comprehensive metabolic panel   Collection Time: 07/03/22 11:12 AM  Result Value Ref Range   Glucose 92 70 - 99 mg/dL   BUN 12 6 - 20 mg/dL   Creatinine, Ser 9.14 0.57 - 1.00 mg/dL   eGFR 782 >95 AO/ZHY/8.65   BUN/Creatinine Ratio 15 9 - 23   Sodium 138 134 - 144 mmol/L   Potassium 4.4 3.5 - 5.2 mmol/L   Chloride 103 96 - 106 mmol/L   CO2 22 20 - 29 mmol/L   Calcium 9.7 8.7 - 10.2 mg/dL   Total Protein 7.0 6.0 - 8.5 g/dL   Albumin 4.3 4.0 - 5.0 g/dL   Globulin, Total 2.7 1.5 - 4.5 g/dL   Albumin/Globulin Ratio 1.6 1.2 - 2.2   Bilirubin Total 0.5 0.0 - 1.2 mg/dL   Alkaline Phosphatase 82 44 - 121 IU/L   AST 16 0 - 40 IU/L   ALT 10 0 - 32 IU/L  Hemoglobin A1c   Collection Time: 07/03/22 11:12 AM  Result Value Ref Range   Hgb A1c MFr Bld 5.7 (H) 4.8 - 5.6 %   Est. average glucose Bld gHb Est-mCnc 117 mg/dL  Iron, TIBC and Ferritin Panel   Collection Time: 07/03/22 11:12 AM  Result Value Ref Range   Total Iron Binding Capacity 331 250 - 450 ug/dL   UIBC 784 696 - 295 ug/dL   Iron 284 27 - 132 ug/dL   Iron Saturation 37 15 - 55 %   Ferritin 74 15 - 150 ng/mL  Lipid panel   Collection Time: 07/03/22 11:12 AM  Result Value Ref Range   Cholesterol, Total 178 100 - 199 mg/dL   Triglycerides 53 0 - 149 mg/dL   HDL 59 >44 mg/dL   VLDL Cholesterol Cal 10 5 - 40 mg/dL   LDL Chol Calc (NIH) 010 (H) 0 - 99 mg/dL   Chol/HDL Ratio 3.0 0.0 - 4.4 ratio  VITAMIN D 25 Hydroxy (Vit-D Deficiency, Fractures)   Collection Time: 07/03/22 11:12  AM  Result Value Ref Range   Vit D, 25-Hydroxy 15.6 (L) 30.0 - 100.0 ng/mL  TSH   Collection Time: 07/03/22 11:12 AM  Result Value Ref Range   TSH 0.648 0.450 - 4.500 uIU/mL       Assessment & Plan:   Problem List Items Addressed This Visit     Surgery, elective - Primary   Planning abdominoplasty with liposuction in late July. Prefers Lennis Korb labs to address potential deficiencies, such as low iron, before surgery.  Discussed need for post-operative massages due to liposuction. - Order CMP, CBC, PT, INR, cholesterol, vitamin D, iron, blood sugar, kidney function, liver function, electrolytes, red blood cells, and white blood cells tests - Schedule follow-up lab tests closer to the surgery date - Perform STD testing including HIV      Relevant Orders   Hemoglobin A1c   CBC with Differential/Platelet   Comprehensive metabolic panel   Iron, TIBC and Ferritin Panel   TSH+T4F+T3Free   PT and PTT   Beta hCG quant (ref lab)   HIV Antibody (routine testing w rflx)   EKG 12-Lead   Moderate persistent asthma without complication   Asthma is well-controlled with allergy shots twice a week and medications as needed. - Consult with allergist regarding the need for a pneumonia vaccine - Provide refills for Albuterol and Flovent if needed      Relevant Orders   CBC with Differential/Platelet   History of anemia   Relevant Orders   CBC with Differential/Platelet   Iron, TIBC and Ferritin Panel      Follow up plan: Return for Late June/Pauleen Goleman July- Nurse visit for EKG and labs for surgery, Nurse Visit, Labs.  NEXT PREVENTATIVE PHYSICAL DUE IN 1 YEAR.  PATIENT COUNSELING PROVIDED FOR ALL ADULT PATIENTS: A well balanced diet low in saturated fats, cholesterol, and moderation in carbohydrates.  This can be as simple as monitoring portion sizes and cutting back on sugary beverages such as soda and juice to start with.    Daily water consumption of at least 64 ounces.  Physical activity at least 180 minutes per week.  If just starting out, start 10 minutes a day and work your way up.   This can be as simple as taking the stairs instead of the elevator and walking 2-3 laps around the office  purposefully every day.   STD protection, partner selection, and regular testing if high risk.  Limited consumption of alcoholic beverages if alcohol is consumed. For men, I recommend no more than 14 alcoholic  beverages per week, spread out throughout the week (max 2 per day). Avoid "binge" drinking or consuming large quantities of alcohol in one setting.  Please let me know if you feel you may need help with reduction or quitting alcohol consumption.   Avoidance of nicotine, if used. Please let me know if you feel you may need help with reduction or quitting nicotine use.   Daily mental health attention. This can be in the form of 5 minute daily meditation, prayer, journaling, yoga, reflection, etc.  Purposeful attention to your emotions and mental state can significantly improve your overall wellbeing  and  Health.  Please know that I am here to help you with all of your health care goals and am happy to work with you to find a solution that works best for you.  The greatest advice I have received with any changes in life are to take it one step at a time, that even means  if all you can focus on is the next 60 seconds, then do that and celebrate your victories.  With any changes in life, you will have set backs, and that is OK. The important thing to remember is, if you have a set back, it is not a failure, it is an opportunity to try again! Screening Testing Mammogram Every 1 -2 years based on history and risk factors Starting at age 80 Pap Smear Ages 21-39 every 3 years Ages 105-65 every 5 years with HPV testing More frequent testing may be required based on results and history Colon Cancer Screening Every 1-10 years based on test performed, risk factors, and history Starting at age 31 Bone Density Screening Every 2-10 years based on history Starting at age 10 for women Recommendations for men differ based on medication usage, history, and risk factors AAA Screening One time ultrasound Men 40-79 years old who have every smoked Lung Cancer Screening Low Dose Lung CT every 12 months Age 20-80 years with a 30 pack-year smoking history who still smoke or who have quit within the last 15  years   Screening Labs Routine  Labs: Complete Blood Count (CBC), Complete Metabolic Panel (CMP), Cholesterol (Lipid Panel) Every 6-12 months based on history and medications May be recommended more frequently based on current conditions or previous results Hemoglobin A1c Lab Every 3-12 months based on history and previous results Starting at age 103 or earlier with diagnosis of diabetes, high cholesterol, BMI >26, and/or risk factors Frequent monitoring for patients with diabetes to ensure blood sugar control Thyroid Panel (TSH) Every 6 months based on history, symptoms, and risk factors May be repeated more often if on medication HIV One time testing for all patients 72 and older May be repeated more frequently for patients with increased risk factors or exposure Hepatitis C One time testing for all patients 72 and older May be repeated more frequently for patients with increased risk factors or exposure Gonorrhea, Chlamydia Every 12 months for all sexually active persons 13-24 years Additional monitoring may be recommended for those who are considered high risk or who have symptoms Every 12 months for any woman on birth control, regardless of sexual activity PSA Men 12-18 years old with risk factors Additional screening may be recommended from age 32-69 based on risk factors, symptoms, and history  Vaccine Recommendations Tetanus Booster All adults every 10 years Flu Vaccine All patients 6 months and older every year COVID Vaccine All patients 12 years and older Initial dosing with booster May recommend additional booster based on age and health history HPV Vaccine 2 doses all patients age 10-26 Dosing may be considered for patients over 26 Shingles Vaccine (Shingrix) 2 doses all adults 55 years and older Pneumonia (Pneumovax 32) All adults 65 years and older May recommend earlier dosing based on health history One year apart from Prevnar 42 Pneumonia (Prevnar 25) All  adults 65 years and older Dosed 1 year after Pneumovax 23 Pneumonia (Prevnar 20) One time alternative to the two dosing of 13 and 23 For all adults with initial dose of 23, 20 is recommended 1 year later For all adults with initial dose of 13, 23 is still recommended as second option 1 year later

## 2023-07-08 NOTE — Progress Notes (Unsigned)
 Follow Up Note  RE: Andrea Foster MRN: [id number] DOB: 09-02-1990 Date of Office Visit: 07/09/2023  Referring provider: Tollie Eth, NP Primary care provider: Tollie Eth, NP  Chief Complaint: No chief complaint on file.  History of Present Illness: I had the pleasure of seeing Andrea Foster for a follow up visit at the [facility] on 07/08/2023. She is a 33 y.o. female, who is being followed for asthma, allergic rhinitis on AIT, oral allergy syndrome and GERD. Her previous allergy office visit was on 03/12/2023 with Dr. Selena Batten. Today is a regular follow up visit.  Discussed the use of AI scribe software for clinical note transcription with the patient, who gave verbal consent to proceed.  History of Present Illness            ***  Assessment and Plan: Andrea Foster is a 33 y.o. female with: Moderate persistent asthma without complication Past history - diagnosed with asthma 20+ years ago and flared 5 years ago. Tried Flovent, Pulmicort and Advair as a child. Currently has symptoms once per week and worse with weather changes. Albuterol in the past caused palpitations. 2024 spirometry normal pattern with 9% and 210cc improvement in FEV1 post bronchodilator treatment. Clinically feeling improved.  Interim history - Recent exacerbation requiring use of rescue inhaler (Xopenex) due to weather changes. No recent ER visits. Patient has not been using a maintenance inhaler due to insurance issues. Today's spirometry showed some mild obstruction. During respiratory infections/flares:  Start Flovent 2 puffs twice a day with spacer and rinse mouth afterwards for 1-2 weeks until your breathing symptoms return to baseline.  Pretreat with albuterol 2 puffs. May use levoalbuterol rescue inhaler 2 puffs every 4 to 6 hours as needed for shortness of breath, chest tightness, coughing, and wheezing. May use levoalbuterol rescue inhaler 2 puffs 5 to 15 minutes prior to  strenuous physical activities. Monitor frequency of use - if you need to use it more than twice per week on a consistent basis let us know.    Seasonal allergic rhinitis due to pollen Allergic rhinitis due to mold Allergic rhinitis due to dust mite Allergic rhinitis due to animal dander Past history - perennial rhinoconjunctivitis symptoms which flare in the spring and fall.  Patient works as a Museum/gallery curator. 2024 skin testing positive to grass, ragweed, weed, trees, mold, dust mites, cat, dog. Singulair caused facial rash. Interim history - Start AIT on 11/30/2022 (MDmCD & GRWWT) with no issues. Continue environmental control measures. Use over the counter antihistamines such as Zyrtec (cetirizine), Claritin (loratadine), Allegra (fexofenadine), or Xyzal (levocetirizine) daily as needed. May take twice a day during allergy flares. May switch antihistamines every few months. Use Flonase (fluticasone) nasal spray 1-2 sprays per nostril once a day as needed for nasal congestion.  Nasal saline spray (i.e., Simply Saline) or nasal saline lavage (i.e., NeilMed) is recommended as needed and prior to medicated nasal sprays. Use olopatadine eye drops 0.7% once a day as needed for itchy/watery eyes. Wait 10-15 min before putting contact lens in. Continue allergy injections - given today. Back down a few doses as patient was over 4 weeks late.    Oral allergy syndrome, subsequent encounter Past history - perioral discomfort/itching after eating certain walnuts. No issues with other tree nuts and peanuts. 2024 skin testing borderline to almond and hazelnut.  Interim history - no reactions. Avoid tree nuts.   Gastroesophageal reflux disease, unspecified whether esophagitis present Stable. Continue lifestyle and  dietary modifications. Assessment and Plan              No follow-ups on file.  No orders of the defined types were placed in this encounter.  Lab Orders  No laboratory test(s) ordered  today    Diagnostics: Spirometry:  Tracings reviewed. Her effort: {Blank single:19197::"Good reproducible efforts.","It was hard to get consistent efforts and there is a question as to whether this reflects a maximal maneuver.","Poor effort, data can not be interpreted."} FVC: ***L FEV1: ***L, ***% predicted FEV1/FVC ratio: ***% Interpretation: {Blank single:19197::"Spirometry consistent with mild obstructive disease","Spirometry consistent with moderate obstructive disease","Spirometry consistent with severe obstructive disease","Spirometry consistent with possible restrictive disease","Spirometry consistent with mixed obstructive and restrictive disease","Spirometry uninterpretable due to technique","Spirometry consistent with normal pattern","No overt abnormalities noted given today's efforts"}.  Please see scanned spirometry results for details.  Skin Testing: {Blank single:19197::"Select foods","Environmental allergy panel","Environmental allergy panel and select foods","Food allergy panel","None","Deferred due to recent antihistamines use"}. *** Results discussed with patient/family.   Medication List:  Current Outpatient Medications  Medication Sig Dispense Refill  . albuterol (VENTOLIN HFA) 108 (90 Base) MCG/ACT inhaler Inhale 2 puffs into the lungs every 4 (four) hours as needed for wheezing or shortness of breath (coughing fits). (Patient not taking: Reported on 03/12/2023) 18 g 1  . EPINEPHrine 0.3 mg/0.3 mL IJ SOAJ injection Inject 0.3 mg into the muscle as needed for anaphylaxis. 2 each 1  . fluticasone (FLONASE) 50 MCG/ACT nasal spray Place 2 sprays into both nostrils daily as needed (nasal congestion). 16 g 3  . fluticasone (FLOVENT HFA) 110 MCG/ACT inhaler Inhale 2 puffs into the lungs 2 (two) times daily as needed. Use during asthma flare for 1-2 weeks at a time. Make sure to rinse mouth after each use. 12 each 3  . mometasone-formoterol (DULERA) 100-5 MCG/ACT AERO Inhale 2  puffs into the lungs in the morning and at bedtime. Rinse mouth after each use. 1 each 3  . Olopatadine HCl 0.7 % SOLN Apply 1 drop to eye daily as needed (itchy/watery eyes). 2.5 mL 5  . pantoprazole (PROTONIX) 40 MG tablet Take 1 tablet (40 mg total) by mouth daily. 30 tablet 3   No current facility-administered medications for this visit.   Allergies: Allergies  Allergen Reactions  . Latex Hives  . Other Anaphylaxis    walnuts  . Singulair [Montelukast]     Rash?   I reviewed her past medical history, social history, family history, and environmental history and no significant changes have been reported from her previous visit.  Review of Systems  Constitutional:  Negative for appetite change, chills, fever and unexpected weight change.  HENT:  Negative for congestion and rhinorrhea.   Eyes:  Positive for itching.  Respiratory:  Negative for cough, chest tightness, shortness of breath and wheezing.   Cardiovascular:  Negative for chest pain.  Gastrointestinal:  Negative for abdominal pain.  Genitourinary:  Negative for difficulty urinating.  Skin:  Negative for rash.  Allergic/Immunologic: Positive for environmental allergies.  Neurological:  Negative for headaches.   Objective: There were no vitals taken for this visit. There is no height or weight on file to calculate BMI. Physical Exam Vitals and nursing note reviewed.  Constitutional:      Appearance: Normal appearance. She is well-developed.  HENT:     Head: Normocephalic and atraumatic.     Right Ear: Tympanic membrane and external ear normal.     Left Ear: Tympanic membrane and external ear normal.  Nose: Nose normal.     Mouth/Throat:     Mouth: Mucous membranes are moist.     Pharynx: Oropharynx is clear.  Eyes:     Conjunctiva/sclera: Conjunctivae normal.  Cardiovascular:     Rate and Rhythm: Normal rate and regular rhythm.     Heart sounds: Normal heart sounds. No murmur heard.    No friction rub. No  gallop.  Pulmonary:     Effort: Pulmonary effort is normal.     Breath sounds: Normal breath sounds. No wheezing, rhonchi or rales.  Musculoskeletal:     Cervical back: Neck supple.  Skin:    General: Skin is warm.     Findings: No rash.  Neurological:     Mental Status: She is alert and oriented to person, place, and time.  Psychiatric:        Behavior: Behavior normal.  Previous notes and tests were reviewed. The plan was reviewed with the patient/family, and all questions/concerned were addressed.  It was my pleasure to see Andrea Foster today and participate in her care. Please feel free to contact me with any questions or concerns.  Sincerely,  Wyline Mood, DO Allergy & Immunology  [facility] office: [phone] [facility] office: [phone]

## 2023-07-09 ENCOUNTER — Ambulatory Visit (INDEPENDENT_AMBULATORY_CARE_PROVIDER_SITE_OTHER): Payer: Medicaid Other | Admitting: Allergy

## 2023-07-09 ENCOUNTER — Ambulatory Visit: Payer: Medicaid Other | Admitting: Nurse Practitioner

## 2023-07-09 ENCOUNTER — Other Ambulatory Visit: Payer: Self-pay

## 2023-07-09 ENCOUNTER — Encounter: Payer: Self-pay | Admitting: Allergy

## 2023-07-09 ENCOUNTER — Encounter: Payer: Self-pay | Admitting: Nurse Practitioner

## 2023-07-09 ENCOUNTER — Ambulatory Visit: Payer: Self-pay

## 2023-07-09 VITALS — BP 100/60 | HR 60 | Ht 65.0 in | Wt 159.4 lb

## 2023-07-09 VITALS — BP 118/72 | HR 70 | Temp 98.2°F | Resp 18

## 2023-07-09 DIAGNOSIS — J3081 Allergic rhinitis due to animal (cat) (dog) hair and dander: Secondary | ICD-10-CM | POA: Diagnosis not present

## 2023-07-09 DIAGNOSIS — T781XXD Other adverse food reactions, not elsewhere classified, subsequent encounter: Secondary | ICD-10-CM

## 2023-07-09 DIAGNOSIS — J4540 Moderate persistent asthma, uncomplicated: Secondary | ICD-10-CM

## 2023-07-09 DIAGNOSIS — J4530 Mild persistent asthma, uncomplicated: Secondary | ICD-10-CM | POA: Diagnosis not present

## 2023-07-09 DIAGNOSIS — J3089 Other allergic rhinitis: Secondary | ICD-10-CM

## 2023-07-09 DIAGNOSIS — Z419 Encounter for procedure for purposes other than remedying health state, unspecified: Secondary | ICD-10-CM

## 2023-07-09 DIAGNOSIS — Z862 Personal history of diseases of the blood and blood-forming organs and certain disorders involving the immune mechanism: Secondary | ICD-10-CM | POA: Diagnosis not present

## 2023-07-09 DIAGNOSIS — J301 Allergic rhinitis due to pollen: Secondary | ICD-10-CM

## 2023-07-09 DIAGNOSIS — K219 Gastro-esophageal reflux disease without esophagitis: Secondary | ICD-10-CM

## 2023-07-09 DIAGNOSIS — Z0000 Encounter for general adult medical examination without abnormal findings: Secondary | ICD-10-CM

## 2023-07-09 DIAGNOSIS — J309 Allergic rhinitis, unspecified: Secondary | ICD-10-CM

## 2023-07-09 MED ORDER — FLUTICASONE PROPIONATE HFA 110 MCG/ACT IN AERO: 2.0000 | INHALATION_SPRAY | Freq: Two times a day (BID) | RESPIRATORY_TRACT | 3 refills | Status: AC | PRN

## 2023-07-09 MED ORDER — OLOPATADINE HCL 0.2 % OP SOLN
1.0000 [drp] | Freq: Every day | OPHTHALMIC | 3 refills | Status: DC | PRN
Start: 2023-07-09 — End: 2023-07-24

## 2023-07-09 NOTE — Assessment & Plan Note (Signed)
 Asthma is well-controlled with allergy shots twice a week and medications as needed. - Consult with allergist regarding the need for a pneumonia vaccine - Provide refills for Albuterol and Flovent if needed

## 2023-07-09 NOTE — Assessment & Plan Note (Signed)
 Planning abdominoplasty with liposuction in late July. Prefers Andrea Foster labs to address potential deficiencies, such as low iron, before surgery. Discussed need for post-operative massages due to liposuction. - Order CMP, CBC, PT, INR, cholesterol, vitamin D, iron, blood sugar, kidney function, liver function, electrolytes, red blood cells, and white blood cells tests - Schedule follow-up lab tests closer to the surgery date - Perform STD testing including HIV

## 2023-07-09 NOTE — Patient Instructions (Signed)
 Ask the Allergist if they recommend you have the pneumonia vaccine for protection given that you are higher risk. I am happy to give that here if they recommend it, but they may also give this in their office.   We will plan to schedule the surgery labs about 3-4 weeks before your procedure. I will let you know if your labs today show any concerns that we need to work on.    For all adult patients, I recommend A well balanced diet low in saturated fats, cholesterol, and moderation in carbohydrates.   This can be as simple as monitoring portion sizes and cutting back on sugary beverages such as soda and juice to start with.    Daily water consumption of at least 64 ounces.  Physical activity at least 180 minutes per week, if just starting out.   This can be as simple as taking the stairs instead of the elevator and walking 2-3 laps around the office  purposefully every day.   STD protection, partner selection, and regular testing if high risk.  Limited consumption of alcoholic beverages if alcohol is consumed.  For women, I recommend no more than 7 alcoholic beverages per week, spread out throughout the week.  Avoid "binge" drinking or consuming large quantities of alcohol in one setting.   Please let me know if you feel you may need help with reduction or quitting alcohol consumption.   Avoidance of nicotine, if used.  Please let me know if you feel you may need help with reduction or quitting nicotine use.   Daily mental health attention.  This can be in the form of 5 minute daily meditation, prayer, journaling, yoga, reflection, etc.   Purposeful attention to your emotions and mental state can significantly improve your overall wellbeing  and  Health.  Please know that I am here to help you with all of your health care goals and am happy to work with you to find a solution that works best for you.  The greatest advice I have received with any changes in life are to take it one step at  a time, that even means if all you can focus on is the next 60 seconds, then do that and celebrate your victories.  With any changes in life, you will have set backs, and that is OK. The important thing to remember is, if you have a set back, it is not a failure, it is an opportunity to try again!  Health Maintenance Recommendations Screening Testing Mammogram Every 1 -2 years based on history and risk factors Starting at age 30 Pap Smear Ages 21-39 every 3 years Ages 75-65 every 5 years with HPV testing More frequent testing may be required based on results and history Colon Cancer Screening Every 1-10 years based on test performed, risk factors, and history Starting at age 23 Bone Density Screening Every 2-10 years based on history Starting at age 28 for women Recommendations for men differ based on medication usage, history, and risk factors AAA Screening One time ultrasound Men 51-50 years old who have every smoked Lung Cancer Screening Low Dose Lung CT every 12 months Age 48-80 years with a 30 pack-year smoking history who still smoke or who have quit within the last 15 years  Screening Labs Routine  Labs: Complete Blood Count (CBC), Complete Metabolic Panel (CMP), Cholesterol (Lipid Panel) Every 6-12 months based on history and medications May be recommended more frequently based on current conditions or previous results Hemoglobin A1c  Lab Every 3-12 months based on history and previous results Starting at age 31 or earlier with diagnosis of diabetes, high cholesterol, BMI >26, and/or risk factors Frequent monitoring for patients with diabetes to ensure blood sugar control Thyroid Panel (TSH w/ T3 & T4) Every 6 months based on history, symptoms, and risk factors May be repeated more often if on medication HIV One time testing for all patients 76 and older May be repeated more frequently for patients with increased risk factors or exposure Hepatitis C One time testing  for all patients 89 and older May be repeated more frequently for patients with increased risk factors or exposure Gonorrhea, Chlamydia Every 12 months for all sexually active persons 13-24 years Additional monitoring may be recommended for those who are considered high risk or who have symptoms PSA Men 44-30 years old with risk factors Additional screening may be recommended from age 33-69 based on risk factors, symptoms, and history  Vaccine Recommendations Tetanus Booster All adults every 10 years Flu Vaccine All patients 6 months and older every year COVID Vaccine All patients 12 years and older Initial dosing with booster May recommend additional booster based on age and health history HPV Vaccine 2 doses all patients age 48-26 Dosing may be considered for patients over 26 Shingles Vaccine (Shingrix) 2 doses all adults 55 years and older Pneumonia (Pneumovax 23) All adults 65 years and older May recommend earlier dosing based on health history Pneumonia (Prevnar 85) All adults 65 years and older Dosed 1 year after Pneumovax 23  Additional Screening, Testing, and Vaccinations may be recommended on an individualized basis based on family history, health history, risk factors, and/or exposure.

## 2023-07-09 NOTE — Patient Instructions (Addendum)
 Asthma Normal breathing test today.  During respiratory infections/flares:  Start Flovent (fluticasone) 2 puffs twice a day with spacer and rinse mouth afterwards for 1-2 weeks until your breathing symptoms return to baseline.  Pretreat with levoalbuterol 2 puffs. May use levoalbuterol rescue inhaler 2 puffs every 4 to 6 hours as needed for shortness of breath, chest tightness, coughing, and wheezing. May use levoalbuterol rescue inhaler 2 puffs 5 to 15 minutes prior to strenuous physical activities. Monitor frequency of use - if you need to use it more than twice per week on a consistent basis let us know.  Breathing control goals:  Full participation in all desired activities (may need albuterol before activity) Albuterol use two times or less a week on average (not counting use with activity) Cough interfering with sleep two times or less a month Oral steroids no more than once a year No hospitalizations   Environmental allergies 2024 skin testing positive to grass, ragweed, weed, trees, mold, dust mites, cat, dog Continue environmental control measures.  Use over the counter antihistamines such as Zyrtec (cetirizine), Claritin (loratadine), Allegra (fexofenadine), or Xyzal (levocetirizine) daily as needed. May take twice a day during allergy flares. May switch antihistamines every few months. Use Flonase (fluticasone) nasal spray 1-2 sprays per nostril once a day as needed for nasal congestion.  Nasal saline spray (i.e., Simply Saline) or nasal saline lavage (i.e., NeilMed) is recommended as needed and prior to medicated nasal sprays. Use olopatadine eye drops 0.2% once a day as needed for itchy/watery eyes. Wait 10-15 min before putting contact lens in. Continue allergy injections - given today.   Food Avoid tree nuts and peanuts.  2024 skin testing borderline to almond and hazelnut.  For mild symptoms you can take over the counter antihistamines such as Benadryl 1-2 tablets =  25-50mg  and monitor symptoms closely. If symptoms worsen or if you have severe symptoms including breathing issues, throat closure, significant swelling, whole body hives, severe diarrhea and vomiting, lightheadedness then inject epinephrine and seek immediate medical care afterwards. Emergency action plan in place.  Get bloodwork We are ordering labs, so please allow 1-2 weeks for the results to come back. With the newly implemented Cures Act, the labs might be visible to you at the same time that they become visible to me. However, I will not address the results until all of the results are back, so please be patient.  In the meantime, continue recommendations in your patient instructions, including avoidance measures (if applicable), until you hear from me.  Heartburn Continue lifestyle and dietary modifications.  Follow up in 4 months or sooner if needed.    Reducing Pollen Exposure Pollen seasons: trees (spring), grass (summer) and ragweed/weeds (fall). Keep windows closed in your home and car to lower pollen exposure.  Install air conditioning in the bedroom and throughout the house if possible.  Avoid going out in dry windy days - especially early morning. Pollen counts are highest between 5 - 10 AM and on dry, hot and windy days.  Save outside activities for late afternoon or after a heavy rain, when pollen levels are lower.  Avoid mowing of grass if you have grass pollen allergy. Be aware that pollen can also be transported indoors on people and pets.  Dry your clothes in an automatic dryer rather than hanging them outside where they might collect pollen.  Rinse hair and eyes before bedtime. Mold Control Mold and fungi can grow on a variety of surfaces provided certain temperature  and moisture conditions exist.  Outdoor molds grow on plants, decaying vegetation and soil. The major outdoor mold, Alternaria and Cladosporium, are found in very high numbers during hot and dry  conditions. Generally, a late summer - fall peak is seen for common outdoor fungal spores. Rain will temporarily lower outdoor mold spore count, but counts rise rapidly when the rainy period ends. The most important indoor molds are Aspergillus and Penicillium. Dark, humid and poorly ventilated basements are ideal sites for mold growth. The next most common sites of mold growth are the bathroom and the kitchen. Outdoor (Seasonal) Mold Control Use air conditioning and keep windows closed. Avoid exposure to decaying vegetation. Avoid leaf raking. Avoid grain handling. Consider wearing a face mask if working in moldy areas.  Indoor (Perennial) Mold Control  Maintain humidity below 50%. Get rid of mold growth on hard surfaces with water, detergent and, if necessary, 5% bleach (do not mix with other cleaners). Then dry the area completely. If mold covers an area more than 10 square feet, consider hiring an indoor environmental professional. For clothing, washing with soap and water is best. If moldy items cannot be cleaned and dried, throw them away. Remove sources e.g. contaminated carpets. Repair and seal leaking roofs or pipes. Using dehumidifiers in damp basements may be helpful, but empty the water and clean units regularly to prevent mildew from forming. All rooms, especially basements, bathrooms and kitchens, require ventilation and cleaning to deter mold and mildew growth. Avoid carpeting on concrete or damp floors, and storing items in damp areas. Control of House Dust Mite Allergen Dust mite allergens are a common trigger of allergy and asthma symptoms. While they can be found throughout the house, these microscopic creatures thrive in warm, humid environments such as bedding, upholstered furniture and carpeting. Because so much time is spent in the bedroom, it is essential to reduce mite levels there.  Encase pillows, mattresses, and box springs in special allergen-proof fabric covers or  airtight, zippered plastic covers.  Bedding should be washed weekly in hot water (130 F) and dried in a hot dryer. Allergen-proof covers are available for comforters and pillows that can't be regularly washed.  Wash the allergy-proof covers every few months. Minimize clutter in the bedroom. Keep pets out of the bedroom.  Keep humidity less than 50% by using a dehumidifier or air conditioning. You can buy a humidity measuring device called a hygrometer to monitor this.  If possible, replace carpets with hardwood, linoleum, or washable area rugs. If that's not possible, vacuum frequently with a vacuum that has a HEPA filter. Remove all upholstered furniture and non-washable window drapes from the bedroom. Remove all non-washable stuffed toys from the bedroom.  Wash stuffed toys weekly. Pet Allergen Avoidance: Contrary to popular opinion, there are no "hypoallergenic" breeds of dogs or cats. That is because people are not allergic to an animal's hair, but to an allergen found in the animal's saliva, dander (dead skin flakes) or urine. Pet allergy symptoms typically occur within minutes. For some people, symptoms can build up and become most severe 8 to 12 hours after contact with the animal. People with severe allergies can experience reactions in public places if dander has been transported on the pet owners' clothing. Keeping an animal outdoors is only a partial solution, since homes with pets in the yard still have higher concentrations of animal allergens. Before getting a pet, ask your allergist to determine if you are allergic to animals. If your pet is already  considered part of your family, try to minimize contact and keep the pet out of the bedroom and other rooms where you spend a great deal of time. As with dust mites, vacuum carpets often or replace carpet with a hardwood floor, tile or linoleum. High-efficiency particulate air (HEPA) cleaners can reduce allergen levels over time. While dander  and saliva are the source of cat and dog allergens, urine is the source of allergens from rabbits, hamsters, mice and [place] pigs; so ask a non-allergic family member to clean the animal's cage. If you have a pet allergy, talk to your allergist about the potential for allergy immunotherapy (allergy shots). This strategy can often provide long-term relief.

## 2023-07-10 LAB — CMP14+EGFR
ALT: 27 IU/L (ref 0–32)
AST: 28 IU/L (ref 0–40)
Albumin: 4.4 g/dL (ref 4.0–5.0)
Alkaline Phosphatase: 68 IU/L (ref 44–121)
BUN/Creatinine Ratio: 12 (ref 9–23)
BUN: 9 mg/dL (ref 6–20)
Bilirubin Total: 0.4 mg/dL (ref 0.0–1.2)
CO2: 24 mmol/L (ref 20–29)
Calcium: 9.4 mg/dL (ref 8.7–10.2)
Chloride: 105 mmol/L (ref 96–106)
Creatinine, Ser: 0.77 mg/dL (ref 0.57–1.00)
Globulin, Total: 2.4 g/dL (ref 1.5–4.5)
Glucose: 92 mg/dL (ref 70–99)
Potassium: 4.2 mmol/L (ref 3.5–5.2)
Sodium: 140 mmol/L (ref 134–144)
Total Protein: 6.8 g/dL (ref 6.0–8.5)
eGFR: 106 mL/min/{1.73_m2} (ref >59)

## 2023-07-10 LAB — CBC WITH DIFFERENTIAL/PLATELET
Basophils Absolute: 0 10*3/uL (ref 0.0–0.2)
Basos: 1 %
EOS (ABSOLUTE): 0.6 10*3/uL — ABNORMAL HIGH (ref 0.0–0.4)
Eos: 15 %
Hematocrit: 35.5 % (ref 34.0–46.6)
Hemoglobin: 11.5 g/dL (ref 11.1–15.9)
Immature Grans (Abs): 0 10*3/uL (ref 0.0–0.1)
Immature Granulocytes: 0 %
Lymphocytes Absolute: 1.5 10*3/uL (ref 0.7–3.1)
Lymphs: 34 %
MCH: 29.3 pg (ref 26.6–33.0)
MCHC: 32.4 g/dL (ref 31.5–35.7)
MCV: 90 fL (ref 79–97)
Monocytes Absolute: 0.2 10*3/uL (ref 0.1–0.9)
Monocytes: 6 %
Neutrophils Absolute: 1.9 10*3/uL (ref 1.4–7.0)
Neutrophils: 44 %
Platelets: 267 10*3/uL (ref 150–450)
RBC: 3.93 x10E6/uL (ref 3.77–5.28)
RDW: 11.8 % (ref 11.7–15.4)
WBC: 4.3 10*3/uL (ref 3.4–10.8)

## 2023-07-10 LAB — STI PROFILE, CT/NG/TV
Chlamydia by NAA: NEGATIVE
Gonococcus by NAA: NEGATIVE
HCV Ab: NONREACTIVE
HIV Screen 4th Generation wRfx: NONREACTIVE
Hep B Core Total Ab: NEGATIVE
Hep B Surface Ab, Qual: REACTIVE
Hepatitis B Surface Ag: NEGATIVE
RPR Ser Ql: NONREACTIVE
Trich vag by NAA: NEGATIVE

## 2023-07-10 LAB — VITAMIN D 25 HYDROXY (VIT D DEFICIENCY, FRACTURES): Vit D, 25-Hydroxy: 39.8 ng/mL (ref 30.0–100.0)

## 2023-07-10 LAB — IRON,TIBC AND FERRITIN PANEL
Ferritin: 33 ng/mL (ref 15–150)
Iron Saturation: 15 % (ref 15–55)
Iron: 49 ug/dL (ref 27–159)
Total Iron Binding Capacity: 329 ug/dL (ref 250–450)
UIBC: 280 ug/dL (ref 131–425)

## 2023-07-10 LAB — HCV INTERPRETATION

## 2023-07-10 LAB — LIPID PANEL
Chol/HDL Ratio: 2.6 ratio (ref 0.0–4.4)
Cholesterol, Total: 178 mg/dL (ref 100–199)
HDL: 69 mg/dL (ref >39)
LDL Chol Calc (NIH): 100 mg/dL — ABNORMAL HIGH (ref 0–99)
Triglycerides: 41 mg/dL (ref 0–149)
VLDL Cholesterol Cal: 9 mg/dL (ref 5–40)

## 2023-07-10 LAB — HEMOGLOBIN A1C
Est. average glucose Bld gHb Est-mCnc: 111 mg/dL
Hgb A1c MFr Bld: 5.5 % (ref 4.8–5.6)

## 2023-07-10 LAB — TSH: TSH: 0.859 u[IU]/mL (ref 0.450–4.500)

## 2023-07-13 ENCOUNTER — Encounter: Payer: Self-pay | Admitting: Nurse Practitioner

## 2023-07-16 ENCOUNTER — Encounter: Payer: Self-pay | Admitting: Allergy

## 2023-07-16 LAB — IGE NUT PROF. W/COMPONENT RFLX
F017-IgE Hazelnut (Filbert): 4.46 kU/L — AB
F018-IgE Brazil Nut: 0.21 kU/L — AB
F020-IgE Almond: 2.81 kU/L — AB
F202-IgE Cashew Nut: 0.11 kU/L — AB
F203-IgE Pistachio Nut: 1.1 kU/L — AB
F256-IgE Walnut: 2.36 kU/L — AB
Macadamia Nut, IgE: 0.89 kU/L — AB
Peanut, IgE: 3.14 kU/L — AB
Pecan Nut IgE: 0.41 kU/L — AB

## 2023-07-16 LAB — PEANUT COMPONENTS
F352-IgE Ara h 8: <0.1 kU/L
F422-IgE Ara h 1: <0.1 kU/L
F423-IgE Ara h 2: <0.1 kU/L
F424-IgE Ara h 3: <0.1 kU/L
F427-IgE Ara h 9: 4.29 kU/L — AB
F447-IgE Ara h 6: <0.1 kU/L

## 2023-07-16 LAB — PANEL 604726
Cor A 1 IgE: <0.1 kU/L
Cor A 14 IgE: <0.1 kU/L
Cor A 8 IgE: 0.71 kU/L — AB
Cor A 9 IgE: <0.1 kU/L

## 2023-07-16 LAB — PANEL 604721
Jug R 1 IgE: <0.1 kU/L
Jug R 3 IgE: 2.94 kU/L — AB

## 2023-07-16 LAB — PANEL 604350: Ber E 1 IgE: <0.1 kU/L

## 2023-07-16 LAB — ALLERGEN COMPONENT COMMENTS

## 2023-07-16 LAB — PANEL 604239: ANA O 3 IgE: <0.1 kU/L

## 2023-07-23 ENCOUNTER — Ambulatory Visit (INDEPENDENT_AMBULATORY_CARE_PROVIDER_SITE_OTHER): Payer: Self-pay

## 2023-07-23 ENCOUNTER — Encounter: Payer: Self-pay | Admitting: Allergy

## 2023-07-23 DIAGNOSIS — F4325 Adjustment disorder with mixed disturbance of emotions and conduct: Secondary | ICD-10-CM | POA: Diagnosis not present

## 2023-07-23 DIAGNOSIS — J309 Allergic rhinitis, unspecified: Secondary | ICD-10-CM

## 2023-07-24 ENCOUNTER — Other Ambulatory Visit: Payer: Self-pay

## 2023-07-24 MED ORDER — OLOPATADINE HCL 0.2 % OP SOLN: 1.0000 [drp] | Freq: Every day | OPHTHALMIC | 3 refills | Status: DC | PRN

## 2023-07-24 MED ORDER — FLUTICASONE PROPIONATE 50 MCG/ACT NA SUSP: 2.0000 | Freq: Every day | NASAL | 3 refills | Status: AC | PRN

## 2023-07-30 ENCOUNTER — Ambulatory Visit (INDEPENDENT_AMBULATORY_CARE_PROVIDER_SITE_OTHER): Admitting: *Deleted

## 2023-07-30 DIAGNOSIS — J309 Allergic rhinitis, unspecified: Secondary | ICD-10-CM

## 2023-08-07 ENCOUNTER — Ambulatory Visit (INDEPENDENT_AMBULATORY_CARE_PROVIDER_SITE_OTHER): Payer: Self-pay

## 2023-08-07 DIAGNOSIS — J309 Allergic rhinitis, unspecified: Secondary | ICD-10-CM

## 2023-08-07 DIAGNOSIS — F4325 Adjustment disorder with mixed disturbance of emotions and conduct: Secondary | ICD-10-CM | POA: Diagnosis not present

## 2023-08-11 DIAGNOSIS — Z419 Encounter for procedure for purposes other than remedying health state, unspecified: Secondary | ICD-10-CM | POA: Diagnosis not present

## 2023-09-10 DIAGNOSIS — Z419 Encounter for procedure for purposes other than remedying health state, unspecified: Secondary | ICD-10-CM | POA: Diagnosis not present

## 2023-09-11 ENCOUNTER — Ambulatory Visit (INDEPENDENT_AMBULATORY_CARE_PROVIDER_SITE_OTHER): Payer: Self-pay

## 2023-09-11 DIAGNOSIS — F4325 Adjustment disorder with mixed disturbance of emotions and conduct: Secondary | ICD-10-CM | POA: Diagnosis not present

## 2023-09-11 DIAGNOSIS — J309 Allergic rhinitis, unspecified: Secondary | ICD-10-CM

## 2023-10-11 DIAGNOSIS — Z419 Encounter for procedure for purposes other than remedying health state, unspecified: Secondary | ICD-10-CM | POA: Diagnosis not present

## 2023-10-18 ENCOUNTER — Ambulatory Visit (INDEPENDENT_AMBULATORY_CARE_PROVIDER_SITE_OTHER): Payer: Self-pay

## 2023-10-18 DIAGNOSIS — J309 Allergic rhinitis, unspecified: Secondary | ICD-10-CM

## 2023-10-22 DIAGNOSIS — F4325 Adjustment disorder with mixed disturbance of emotions and conduct: Secondary | ICD-10-CM | POA: Diagnosis not present

## 2023-10-26 ENCOUNTER — Ambulatory Visit (INDEPENDENT_AMBULATORY_CARE_PROVIDER_SITE_OTHER)

## 2023-10-26 DIAGNOSIS — J309 Allergic rhinitis, unspecified: Secondary | ICD-10-CM | POA: Diagnosis not present

## 2023-10-29 DIAGNOSIS — F4325 Adjustment disorder with mixed disturbance of emotions and conduct: Secondary | ICD-10-CM | POA: Diagnosis not present

## 2023-11-05 ENCOUNTER — Other Ambulatory Visit

## 2023-11-05 DIAGNOSIS — J3089 Other allergic rhinitis: Secondary | ICD-10-CM | POA: Diagnosis not present

## 2023-11-05 NOTE — Progress Notes (Signed)
 VIALS MADE 11-05-23

## 2023-11-06 DIAGNOSIS — J301 Allergic rhinitis due to pollen: Secondary | ICD-10-CM | POA: Diagnosis not present

## 2023-11-10 DIAGNOSIS — Z419 Encounter for procedure for purposes other than remedying health state, unspecified: Secondary | ICD-10-CM | POA: Diagnosis not present

## 2023-11-14 DIAGNOSIS — F4325 Adjustment disorder with mixed disturbance of emotions and conduct: Secondary | ICD-10-CM | POA: Diagnosis not present

## 2023-11-22 ENCOUNTER — Ambulatory Visit (INDEPENDENT_AMBULATORY_CARE_PROVIDER_SITE_OTHER)

## 2023-11-22 DIAGNOSIS — J309 Allergic rhinitis, unspecified: Secondary | ICD-10-CM

## 2023-11-22 DIAGNOSIS — Z113 Encounter for screening for infections with a predominantly sexual mode of transmission: Secondary | ICD-10-CM | POA: Diagnosis not present

## 2023-11-23 DIAGNOSIS — F4325 Adjustment disorder with mixed disturbance of emotions and conduct: Secondary | ICD-10-CM | POA: Diagnosis not present

## 2023-12-07 ENCOUNTER — Ambulatory Visit (INDEPENDENT_AMBULATORY_CARE_PROVIDER_SITE_OTHER)

## 2023-12-07 DIAGNOSIS — J309 Allergic rhinitis, unspecified: Secondary | ICD-10-CM

## 2023-12-11 DIAGNOSIS — Z419 Encounter for procedure for purposes other than remedying health state, unspecified: Secondary | ICD-10-CM | POA: Diagnosis not present

## 2023-12-26 ENCOUNTER — Ambulatory Visit (INDEPENDENT_AMBULATORY_CARE_PROVIDER_SITE_OTHER)

## 2023-12-26 DIAGNOSIS — J309 Allergic rhinitis, unspecified: Secondary | ICD-10-CM | POA: Diagnosis not present

## 2024-01-03 NOTE — Progress Notes (Deleted)
 Follow Up Note  RE: Andrea Foster MRN: [id number] DOB: 30-Sep-1990 Date of Office Visit: 01/04/2024  Referring provider: Oris Camie BRAVO, NP Primary care provider: Oris Camie BRAVO, NP  Chief Complaint: No chief complaint on file.  History of Present Illness: I had the pleasure of seeing Andrea Foster for a follow up visit at the [facility] on 01/04/2024. She is a 33 y.o. female, who is being followed for asthma, allergic rhinitis on AIT, adverse food reactions. Her previous allergy  office visit was on 07/09/2023 with Dr. Luke. Today is a regular follow up visit.  Discussed the use of AI scribe software for clinical note transcription with the patient, who gave verbal consent to proceed.  History of Present Illness            ***  Assessment and Plan: Andrea Foster is a 33 y.o. female with: Mild persistent asthma without complication Past history - diagnosed with asthma 20+ years ago and flared 5 years ago. Tried Flovent , Pulmicort and Advair as a child. Currently has symptoms once per week and worse with weather changes. Albuterol  in the past caused palpitations. 2024 spirometry normal pattern with 9% and 210cc improvement in FEV1 post bronchodilator treatment. Clinically feeling improved.  Interim history - well-controlled, no recent exacerbations or rescue inhaler use. Flovent  supply issues noted. Today's spirometry was normal. During respiratory infections/flares:  Start Flovent  (fluticasone ) 110mcg 2 puffs twice a day with spacer and rinse mouth afterwards for 1-2 weeks until your breathing symptoms return to baseline.  Pretreat with levoalbuterol 2 puffs. May use levoalbuterol rescue inhaler 2 puffs every 4 to 6 hours as needed for shortness of breath, chest tightness, coughing, and wheezing. May use levoalbuterol rescue inhaler 2 puffs 5 to 15 minutes prior to strenuous physical activities. Monitor frequency of use - if you need to use it more than twice per  week on a consistent basis let us  know.    Seasonal allergic rhinitis due to pollen Allergic rhinitis due to mold Allergic rhinitis due to dust mite Allergic rhinitis due to animal dander Past history - perennial rhinoconjunctivitis symptoms which flare in the spring and fall.  Patient works as a Museum/gallery curator. 2024 skin testing positive to grass, ragweed, weed, trees, mold, dust mites, cat, dog. Singulair  caused facial rash. Started AIT on 11/30/2022 (MDmCD & GRWWT) Interim history - tolerating AIT with minimal localized reactions treated with topical hydrocortisone prn. Continue environmental control measures.  Use over the counter antihistamines such as Zyrtec (cetirizine), Claritin (loratadine), Allegra (fexofenadine), or Xyzal (levocetirizine) daily as needed. May take twice a day during allergy  flares. May switch antihistamines every few months. Use Flonase  (fluticasone ) nasal spray 1-2 sprays per nostril once a day as needed for nasal congestion.  Nasal saline spray (i.e., Simply Saline) or nasal saline lavage (i.e., NeilMed) is recommended as needed and prior to medicated nasal sprays. Use olopatadine  eye drops 0.2% once a day as needed for itchy/watery eyes. Wait 10-15 min before putting contact lens in. Continue allergy  injections - given today.    Oral allergy  syndrome, subsequent encounter Other adverse food reactions, not elsewhere classified, subsequent encounter Past history - perioral discomfort/itching after eating certain walnuts. No issues with other tree nuts and peanuts. 2024 skin testing borderline to almond and hazelnut.  Interim history - noted GI issues with peanuts now.  Avoid tree nuts and peanuts.  For mild symptoms you can take over the counter antihistamines such as Benadryl  1-2 tablets = 25-50mg  and  monitor symptoms closely. If symptoms worsen or if you have severe symptoms including breathing issues, throat closure, significant swelling, whole body hives, severe  diarrhea and vomiting, lightheadedness then inject epinephrine  and seek immediate medical care afterwards. Emergency action plan in place.  Get bloodwork. Assessment and Plan              No follow-ups on file.  No orders of the defined types were placed in this encounter.  Lab Orders  No laboratory test(s) ordered today    Diagnostics: Spirometry:  Tracings reviewed. Her effort: {Blank single:19197::Good reproducible efforts.,It was hard to get consistent efforts and there is a question as to whether this reflects a maximal maneuver.,Poor effort, data can not be interpreted.} FVC: ***L FEV1: ***L, ***% predicted FEV1/FVC ratio: ***% Interpretation: {Blank single:19197::Spirometry consistent with mild obstructive disease,Spirometry consistent with moderate obstructive disease,Spirometry consistent with severe obstructive disease,Spirometry consistent with possible restrictive disease,Spirometry consistent with mixed obstructive and restrictive disease,Spirometry uninterpretable due to technique,Spirometry consistent with normal pattern,No overt abnormalities noted given today's efforts}.  Please see scanned spirometry results for details.  Skin Testing: {Blank single:19197::Select foods,Environmental allergy  panel,Environmental allergy  panel and select foods,Food allergy  panel,None,Deferred due to recent antihistamines use}. *** Results discussed with patient/family.   Medication List:  Current Outpatient Medications  Medication Sig Dispense Refill   albuterol  (VENTOLIN  HFA) 108 (90 Base) MCG/ACT inhaler Inhale 2 puffs into the lungs every 4 (four) hours as needed for wheezing or shortness of breath (coughing fits). (Patient not taking: Reported on 03/12/2023) 18 g 1   EPINEPHrine  0.3 mg/0.3 mL IJ SOAJ injection Inject 0.3 mg into the muscle as needed for anaphylaxis. (Patient not taking: Reported on 07/09/2023) 2 each 1   fluticasone  (FLONASE )  50 MCG/ACT nasal spray Place 2 sprays into both nostrils daily as needed (nasal congestion). 16 g 3   fluticasone  (FLOVENT  HFA) 110 MCG/ACT inhaler Inhale 2 puffs into the lungs 2 (two) times daily as needed. Use during asthma flare for 1-2 weeks at a time. Make sure to rinse mouth after each use. 1 each 3   Olopatadine  HCl 0.2 % SOLN Apply 1 drop to eye daily as needed (itchy/watery eyes). 2.5 mL 3   No current facility-administered medications for this visit.   Allergies: Allergies  Allergen Reactions   Latex Hives   Other Anaphylaxis    walnuts   Singulair  [Montelukast ]     Rash?   I reviewed her past medical history, social history, family history, and environmental history and no significant changes have been reported from her previous visit.  Review of Systems  Constitutional:  Negative for appetite change, chills, fever and unexpected weight change.  HENT:  Negative for congestion and rhinorrhea.   Eyes:  Positive for itching.  Respiratory:  Negative for cough, chest tightness, shortness of breath and wheezing.   Cardiovascular:  Negative for chest pain.  Gastrointestinal:  Negative for abdominal pain.  Genitourinary:  Negative for difficulty urinating.  Skin:  Negative for rash.  Allergic/Immunologic: Positive for environmental allergies.  Neurological:  Negative for headaches.    Objective: There were no vitals taken for this visit. There is no height or weight on file to calculate BMI. Physical Exam Vitals and nursing note reviewed.  Constitutional:      Appearance: Normal appearance. She is well-developed.  HENT:     Head: Normocephalic and atraumatic.     Right Ear: Tympanic membrane and external ear normal.     Left Ear: Tympanic membrane and external ear normal.  Nose: Nose normal.     Mouth/Throat:     Mouth: Mucous membranes are moist.     Pharynx: Oropharynx is clear.  Eyes:     Conjunctiva/sclera: Conjunctivae normal.  Cardiovascular:     Rate and  Rhythm: Normal rate and regular rhythm.     Heart sounds: Normal heart sounds. No murmur heard.    No friction rub. No gallop.  Pulmonary:     Effort: Pulmonary effort is normal.     Breath sounds: Normal breath sounds. No wheezing, rhonchi or rales.  Musculoskeletal:     Cervical back: Neck supple.  Skin:    General: Skin is warm.     Findings: No rash.  Neurological:     Mental Status: She is alert and oriented to person, place, and time.  Psychiatric:        Behavior: Behavior normal.    Previous notes and tests were reviewed. The plan was reviewed with the patient/family, and all questions/concerned were addressed.  It was my pleasure to see Andrea Foster today and participate in her care. Please feel free to contact me with any questions or concerns.  Sincerely,  Orlan Cramp, DO Allergy  & Immunology  [facility]  [place] office: [phone] [facility] office: [phone]

## 2024-01-04 ENCOUNTER — Encounter: Payer: Self-pay | Admitting: Allergy

## 2024-01-04 ENCOUNTER — Ambulatory Visit: Admitting: Allergy

## 2024-01-04 DIAGNOSIS — J301 Allergic rhinitis due to pollen: Secondary | ICD-10-CM

## 2024-01-04 DIAGNOSIS — J4530 Mild persistent asthma, uncomplicated: Secondary | ICD-10-CM

## 2024-01-04 DIAGNOSIS — T781XXD Other adverse food reactions, not elsewhere classified, subsequent encounter: Secondary | ICD-10-CM

## 2024-01-04 DIAGNOSIS — J3081 Allergic rhinitis due to animal (cat) (dog) hair and dander: Secondary | ICD-10-CM

## 2024-01-04 DIAGNOSIS — J3089 Other allergic rhinitis: Secondary | ICD-10-CM

## 2024-01-09 DIAGNOSIS — F4325 Adjustment disorder with mixed disturbance of emotions and conduct: Secondary | ICD-10-CM | POA: Diagnosis not present

## 2024-01-11 DIAGNOSIS — Z419 Encounter for procedure for purposes other than remedying health state, unspecified: Secondary | ICD-10-CM | POA: Diagnosis not present

## 2024-01-28 ENCOUNTER — Ambulatory Visit (INDEPENDENT_AMBULATORY_CARE_PROVIDER_SITE_OTHER)

## 2024-01-28 DIAGNOSIS — J309 Allergic rhinitis, unspecified: Secondary | ICD-10-CM | POA: Diagnosis not present

## 2024-02-10 DIAGNOSIS — Z419 Encounter for procedure for purposes other than remedying health state, unspecified: Secondary | ICD-10-CM | POA: Diagnosis not present

## 2024-02-14 ENCOUNTER — Ambulatory Visit

## 2024-02-14 DIAGNOSIS — J309 Allergic rhinitis, unspecified: Secondary | ICD-10-CM | POA: Diagnosis not present

## 2024-02-19 ENCOUNTER — Ambulatory Visit (INDEPENDENT_AMBULATORY_CARE_PROVIDER_SITE_OTHER)

## 2024-02-19 DIAGNOSIS — J309 Allergic rhinitis, unspecified: Secondary | ICD-10-CM

## 2024-02-28 ENCOUNTER — Ambulatory Visit (INDEPENDENT_AMBULATORY_CARE_PROVIDER_SITE_OTHER)

## 2024-02-28 DIAGNOSIS — J309 Allergic rhinitis, unspecified: Secondary | ICD-10-CM | POA: Diagnosis not present

## 2024-03-06 ENCOUNTER — Ambulatory Visit (INDEPENDENT_AMBULATORY_CARE_PROVIDER_SITE_OTHER)

## 2024-03-06 DIAGNOSIS — J309 Allergic rhinitis, unspecified: Secondary | ICD-10-CM | POA: Diagnosis not present

## 2024-03-06 MED ORDER — EPINEPHRINE 0.3 MG/0.3ML IJ SOAJ
0.3000 mg | INTRAMUSCULAR | 1 refills | Status: AC | PRN
Start: 2024-03-06 — End: ?

## 2024-03-07 DIAGNOSIS — F4325 Adjustment disorder with mixed disturbance of emotions and conduct: Secondary | ICD-10-CM | POA: Diagnosis not present

## 2024-03-12 DIAGNOSIS — Z419 Encounter for procedure for purposes other than remedying health state, unspecified: Secondary | ICD-10-CM | POA: Diagnosis not present

## 2024-03-13 ENCOUNTER — Ambulatory Visit (INDEPENDENT_AMBULATORY_CARE_PROVIDER_SITE_OTHER)

## 2024-03-13 DIAGNOSIS — J309 Allergic rhinitis, unspecified: Secondary | ICD-10-CM | POA: Diagnosis not present

## 2024-03-21 ENCOUNTER — Ambulatory Visit (INDEPENDENT_AMBULATORY_CARE_PROVIDER_SITE_OTHER)

## 2024-03-21 DIAGNOSIS — J309 Allergic rhinitis, unspecified: Secondary | ICD-10-CM | POA: Diagnosis not present

## 2024-04-10 ENCOUNTER — Ambulatory Visit (INDEPENDENT_AMBULATORY_CARE_PROVIDER_SITE_OTHER): Admitting: *Deleted

## 2024-04-10 DIAGNOSIS — J309 Allergic rhinitis, unspecified: Secondary | ICD-10-CM

## 2024-05-07 ENCOUNTER — Ambulatory Visit

## 2024-05-07 DIAGNOSIS — J302 Other seasonal allergic rhinitis: Secondary | ICD-10-CM

## 2024-05-14 ENCOUNTER — Ambulatory Visit (INDEPENDENT_AMBULATORY_CARE_PROVIDER_SITE_OTHER)

## 2024-05-14 DIAGNOSIS — J302 Other seasonal allergic rhinitis: Secondary | ICD-10-CM

## 2024-05-22 ENCOUNTER — Ambulatory Visit

## 2024-05-22 DIAGNOSIS — J302 Other seasonal allergic rhinitis: Secondary | ICD-10-CM | POA: Diagnosis not present

## 2024-05-22 MED ORDER — OLOPATADINE HCL 0.2 % OP SOLN: 1.0000 [drp] | Freq: Every day | OPHTHALMIC | 1 refills | Status: AC | PRN

## 2024-05-29 ENCOUNTER — Ambulatory Visit

## 2024-05-29 DIAGNOSIS — J302 Other seasonal allergic rhinitis: Secondary | ICD-10-CM

## 2024-06-06 ENCOUNTER — Ambulatory Visit: Payer: Self-pay

## 2024-06-06 ENCOUNTER — Other Ambulatory Visit (HOSPITAL_COMMUNITY): Payer: Self-pay

## 2024-06-06 ENCOUNTER — Ambulatory Visit: Admitting: Nurse Practitioner

## 2024-06-06 ENCOUNTER — Telehealth: Payer: Self-pay

## 2024-06-06 ENCOUNTER — Encounter: Payer: Self-pay | Admitting: Nurse Practitioner

## 2024-06-06 VITALS — BP 114/78 | HR 72 | Wt 163.6 lb

## 2024-06-06 DIAGNOSIS — N760 Acute vaginitis: Secondary | ICD-10-CM

## 2024-06-06 DIAGNOSIS — K5900 Constipation, unspecified: Secondary | ICD-10-CM

## 2024-06-06 DIAGNOSIS — K219 Gastro-esophageal reflux disease without esophagitis: Secondary | ICD-10-CM

## 2024-06-06 MED ORDER — PANTOPRAZOLE SODIUM 40 MG PO TBEC: DELAYED_RELEASE_TABLET | ORAL | 3 refills | Status: AC

## 2024-06-06 MED ORDER — METRONIDAZOLE 0.75 % VA GEL: 1.0000 | Freq: Every day | VAGINAL | 0 refills | Status: AC

## 2024-06-06 MED ORDER — BETAMETHASONE DIPROPIONATE 0.05 % EX OINT: TOPICAL_OINTMENT | Freq: Two times a day (BID) | CUTANEOUS | 0 refills | Status: AC | PRN

## 2024-06-06 MED ORDER — FLUCONAZOLE 150 MG PO TABS: ORAL_TABLET | ORAL | 2 refills | Status: AC

## 2024-06-06 NOTE — Telephone Encounter (Signed)
 Pharmacy Patient Advocate Encounter   Received notification from [employer] that prior authorization for BETAMETHASONE  DIPROPIONATE 0.05% is required/requested.   Insurance verification completed.   The patient is insured through [employer].   Per test claim: Per test claim, medication is not covered due to plan/benefit exclusion, PA not submitted at this time    NON PREFER PRODUCT

## 2024-06-06 NOTE — Telephone Encounter (Signed)
 Left message for pt to call back to see if she wants to see Ludie in our office vs going to another office as right now we have an opening this afternoon

## 2024-06-06 NOTE — Patient Instructions (Signed)
 VISIT SUMMARY:  During your visit, we discussed your abdominal pain, vaginal irritation, constipation, and reflux. We have developed a treatment plan to address each of these issues and provided you with medications and lifestyle recommendations to help alleviate your symptoms.  YOUR PLAN:  -GASTROESOPHAGEAL REFLUX DISEASE WITH GASTRITIS AND CONSTIPATION: Gastroesophageal reflux disease (GERD) is a condition where stomach acid frequently flows back into the tube connecting your mouth and stomach, causing irritation. Gastritis is inflammation of the stomach lining. Constipation is difficulty in emptying the bowels. We have prescribed pantoprazole  to reduce stomach acid, Pepcid for immediate relief, and Miralax to help with bowel movements. Avoid triggers like spicy foods, carbonated beverages, and red foods. Monitor your symptoms and report if there is no improvement in a few weeks.  -ACUTE VAGINITIS DUE TO BACTERIAL VAGINOSIS AND CANDIDIASIS: Acute vaginitis is inflammation of the vagina, often caused by infections like bacterial vaginosis and candidiasis (yeast infection). We have prescribed metronidazole  vaginal suppositories for 5 nights and fluconazole  pills to treat the infections. Betamethasone  cream is provided for external irritation. Discontinue the use of AZO. Follow the instructions for the medications and monitor for signs of improvement. Seek further medical attention if symptoms persist.  INSTRUCTIONS:  Follow up if your symptoms do not improve in a few weeks or if you experience any new or worsening symptoms. Continue to monitor your symptoms and adhere to the prescribed treatment plan.

## 2024-06-06 NOTE — Assessment & Plan Note (Signed)
 Chronic GERD with recent exacerbation, presenting with epigastric pain, bloating, and constipation. Symptoms worsened after dietary changes and multivitamin use. Possible gastric ulceration contributing to symptoms given underlying stress noted, but no signs of blood. Constipation likely secondary to GERD and Pepto Bismol use. No black or coffee ground stools reported. Previous pantoprazole  treatment was effective. Discussed conservative treatment, as no alarm symptoms are present at this time. If no improvement, or change in symptoms, consider labs for CMP, lipase, cbc. - Prescribed pantoprazole  twice daily for 7 days, then once daily for 30 days. - Advised use of Pepcid as needed for immediate relief. - Recommended Miralax for bowel cleanout, one dose daily until bowel movements are regular. - Educated on avoiding triggers such as spicy foods, carbonated beverages, and red foods. - Advised to monitor symptoms and report if no improvement in a few weeks.  Orders:   pantoprazole  (PROTONIX ) 40 MG tablet; Take once in the AM and once in the PM for 7 days, then once a day.

## 2024-06-06 NOTE — Telephone Encounter (Signed)
 FYI Only or Action Required?: FYI only for provider: appointment scheduled on 06/06/24.  Patient was last seen in primary care on 07/09/2023 by Early, Camie BRAVO, NP.  Called Nurse Triage reporting Abdominal Pain.  Symptoms began several weeks ago.  Interventions attempted: Other: see notes.  Symptoms are: unchanged.  Triage Disposition: See Physician Within 24 Hours, See PCP When Office is Open (Within 3 Days)  Patient/caregiver understands and will follow disposition?: Yes    Message from Ahlexyia S sent at 06/06/2024  8:46 AM EST  Reason for Triage: Pt called in stating that she has been having really bad upper abdominal pain for about 3 weeks. Pt stated that it use to be whenever she ate any type of acidic food it made it worse but now its to where if she eats anything it hurts really bad. Pt stated that she feels as if she is also having some sort of constipation. Pt also mentioned that she recently changed her detergent and is having some sort of vaginal irritation, tingling and very uncomfortable feeling. Pt stated that her whole vaginal area is swollen. Denied any other symptoms. Warm transferred to nurse triage.     Reason for Disposition  [1] Symptoms of a yeast infection (i.e., itchy, white discharge, not bad smelling) AND [2] not improved > 3 days following Care Advice  [1] MODERATE pain (e.g., interferes with normal activities) AND [2] pain comes and goes (cramps) AND [3] present > 24 hours  (Exception: Pain with Vomiting or Diarrhea - see that Guideline.)  Answer Assessment - Initial Assessment Questions No available appts with pcp. Scheduled alt prov 06/06/24.  Advised call back or ED/911 if symptoms worsen. Patient verbalized understanding.    1. LOCATION: Where does it hurt?      epigastric 2. RADIATION: Does the pain shoot anywhere else? (e.g., chest, back)     Left upper  3. ONSET: When did the pain begin? (e.g., minutes, hours or days ago)      Months ago, 3  weeks ago 4. SUDDEN: Gradual or sudden onset?     gradual 5. PATTERN Does the pain come and go, or is it constant?     Comes and goes 6. SEVERITY: How bad is the pain?  (e.g., Scale 1-10; mild, moderate, or severe)     8/10 at worse; last episode last night, not currently 7. RECURRENT SYMPTOM: Have you ever had this type of stomach pain before? If Yes, ask: When was the last time? and What happened that time?      yes 8. CAUSE: What do you think is causing the stomach pain? (e.g., gallstones, recent abdominal surgery)     After eating, everything eats, stay remains, wakes up in middle night, Constipation 9. RELIEVING/AGGRAVATING FACTORS: What makes it better or worse? (e.g., antacids, bending or twisting motion, bowel movement)     Pepto bismol, suppository, nothing helps 10. OTHER SYMPTOMS: Do you have any other symptoms? (e.g., back pain, diarrhea, fever, urination pain, vomiting) Denies diff breathing, chest pain, faint, fever chills,n/v  Answer Assessment - Initial Assessment Questions 1. DISCHARGE: Describe the discharge. (e.g., white, yellow, green, gray, foamy, cottage cheese-like)     White thick; monostat-1, azo 2. ODOR: Is there a bad odor?     no 3. ONSET: When did the discharge begin?     2 days 4. RASH: Is there a rash in the genital area? If Yes, ask: Describe it. (e.g., redness, blisters, sores, bumps)     Denies redness,  blisters, sores 5. ABDOMEN PAIN: Are you having any abdomen pain? If Yes, ask: What does it feel like?  (e.g., crampy, dull, intermittent, constant)      no 6. ABDOMEN PAIN SEVERITY: If present, ask: How bad is it? (e.g., Scale 1-10; mild, moderate, or severe)     no 7. CAUSE: What do you think is causing the discharge? Have you had the same problem before? What happened then?    Sugar wax, detergent 8. OTHER SYMPTOMS: Do you have any other symptoms? (e.g., fever, itching, urination pain, vaginal bleeding,  vaginal foreign body)     Uncomfortable, swollen labia,  9. PREGNANCY: Is there any chance you are pregnant? When was your last menstrual period?     no  Protocols used: Abdominal Pain - Female-A-AH, Vaginal Discharge-A-AH

## 2024-06-06 NOTE — Progress Notes (Signed)
 "  Camie FORBES Doing, DNP, AGNP-c [facility] [address] [place], [place] [address] [phone]   ACUTE VISIT : Acute or New Concern Visit on 06/06/2024  Blood pressure 114/78, pulse 72, weight 163 lb 9.6 oz (74.2 kg), SpO2 97%.   Subjective:  Acute Visit (Abdominal pain, going on for around 3 weeks. Vaginal irritation started about a week ago, took monistat and azo yeast plus and it made it worse. Constipation.)  History of Present Illness Andrea Foster is a 34 year old female who presents with abdominal pain, vaginal irritation, constipation, and reflux.  She has been experiencing abdominal pain for the past three weeks, primarily located in the epigastric region with occasional upward radiation to the left. Initially triggered by spicy foods, the pain now occurs after almost every meal, including non-spicy foods like cake. Described as a 'heavy, dense pain,' it disrupts her sleep, keeping her awake until Rosan Calbert morning. She has tried Tums, Pepto Bismol, and stool softeners without relief and stopped taking her daily multivitamin, suspecting it might worsen her symptoms.  She reports a change in bowel habits, experiencing constipation and a sensation of incomplete evacuation. Her stools are mushy and dark green, but not black. She experiences significant bloating and fullness, and prior to the onset of pain, had episodes of severe, malodorous gas. She drinks what she believes is an adequate amount of water and has not significantly changed her diet.  She also reports vaginal irritation and discharge that began a week ago, attributing the onset to a recent change in laundry detergent and a sugar wax procedure. The discharge initially appeared yeasty with a bad odor. Self-treatment with Monistat and AZO exacerbated her symptoms, causing a 'burny, tingly' sensation. The discharge has decreased, but irritation persists. She reports no new sexual partners and no risk for STI.    ROS negative except for what is listed in HPI. History, Medications, Surgery, SDOH, and Family History reviewed and updated as appropriate.  Objective:  Physical Exam Vitals and nursing note reviewed.  Constitutional:      General: She is not in acute distress.    Appearance: Normal appearance. She is normal weight. She is not ill-appearing.  HENT:     Head: Normocephalic.  Eyes:     Pupils: Pupils are equal, round, and reactive to light.  Cardiovascular:     Rate and Rhythm: Normal rate and regular rhythm.     Pulses: Normal pulses.     Heart sounds: Normal heart sounds.  Pulmonary:     Effort: Pulmonary effort is normal.     Breath sounds: Normal breath sounds.  Abdominal:     General: Abdomen is flat. Bowel sounds are normal. There is no distension or abdominal bruit.     Palpations: Abdomen is soft. There is no shifting dullness, hepatomegaly, splenomegaly, mass or pulsatile mass.     Tenderness: There is abdominal tenderness in the epigastric area. There is no right CVA tenderness, left CVA tenderness, guarding or rebound.     Hernia: No hernia is present.  Musculoskeletal:        General: Normal range of motion.     Cervical back: Normal range of motion.  Skin:    General: Skin is warm.  Neurological:     General: No focal deficit present.     Mental Status: She is alert and oriented to person, place, and time.  Psychiatric:        Mood and Affect: Mood normal.  Assessment & Plan:   Assessment & Plan Gastroesophageal reflux disease, unspecified whether esophagitis present Constipation, unspecified constipation type Chronic GERD with recent exacerbation, presenting with epigastric pain, bloating, and constipation. Symptoms worsened after dietary changes and multivitamin use. Possible gastric ulceration contributing to symptoms given underlying stress noted, but no signs of blood. Constipation likely secondary to GERD and Pepto Bismol use. No black or coffee  ground stools reported. Previous pantoprazole  treatment was effective. Discussed conservative treatment, as no alarm symptoms are present at this time. If no improvement, or change in symptoms, consider labs for CMP, lipase, cbc. - Prescribed pantoprazole  twice daily for 7 days, then once daily for 30 days. - Advised use of Pepcid as needed for immediate relief. - Recommended Miralax for bowel cleanout, one dose daily until bowel movements are regular. - Educated on avoiding triggers such as spicy foods, carbonated beverages, and red foods. - Advised to monitor symptoms and report if no improvement in a few weeks.  Orders:   pantoprazole  (PROTONIX ) 40 MG tablet; Take once in the AM and once in the PM for 7 days, then once a day.  Acute vaginitis Recent onset of vaginal irritation and discharge, likely due to bacterial vaginosis and candidiasis. Symptoms exacerbated by recent use of AZO. Discussed that Monistat may have cleared candida, but symptoms of BV are still present. Possible allergic reaction to AZO. No new sexual partners reported, denies risks for STDs.  - Discontinued AZO use. - Prescribed metronidazole  vaginal suppository for 5 nights. - Prescribed fluconazole , one pill today and one pill in 3 days. - Prescribed betamethasone  cream for external irritation, apply up to twice daily as needed. - Advised on proper use of vaginal suppository and potential side effects. - Educated on signs of improvement and when to seek further medical attention. Orders:   fluconazole  (DIFLUCAN ) 150 MG tablet; Take 1 tablet by mouth today and 1 tablet by mouth in 3 days. For yeast   metroNIDAZOLE  (METROGEL ) 0.75 % vaginal gel; Place 1 Applicatorful vaginally at bedtime. Use for 5 nights.   betamethasone  dipropionate (DIPROLENE ) 0.05 % ointment; Apply topically 2 (two) times daily as needed.    Andrea Foster Aileena Iglesia, DNP, AGNP-c       "

## 2024-07-02 ENCOUNTER — Ambulatory Visit: Admitting: Allergy

## 2024-07-10 ENCOUNTER — Encounter: Payer: Self-pay | Admitting: Nurse Practitioner
